# Patient Record
Sex: Female | Born: 1970
Health system: Southern US, Community
[De-identification: ages and names within clinical notes are randomized; demographics above are authoritative.]

## PROBLEM LIST (undated history)

## (undated) DIAGNOSIS — E785 Hyperlipidemia, unspecified: Secondary | ICD-10-CM

## (undated) DIAGNOSIS — A599 Trichomoniasis, unspecified: Secondary | ICD-10-CM

## (undated) DIAGNOSIS — E1165 Type 2 diabetes mellitus with hyperglycemia: Secondary | ICD-10-CM

## (undated) DIAGNOSIS — I10 Essential (primary) hypertension: Secondary | ICD-10-CM

## (undated) DIAGNOSIS — E559 Vitamin D deficiency, unspecified: Principal | ICD-10-CM

## (undated) DIAGNOSIS — M069 Rheumatoid arthritis, unspecified: Secondary | ICD-10-CM

## (undated) DIAGNOSIS — K219 Gastro-esophageal reflux disease without esophagitis: Secondary | ICD-10-CM

## (undated) DIAGNOSIS — H35371 Puckering of macula, right eye: Secondary | ICD-10-CM

## (undated) DIAGNOSIS — E1140 Type 2 diabetes mellitus with diabetic neuropathy, unspecified: Secondary | ICD-10-CM

## (undated) DIAGNOSIS — E11311 Type 2 diabetes mellitus with unspecified diabetic retinopathy with macular edema: Secondary | ICD-10-CM

## (undated) HISTORY — DX: Vitamin D deficiency, unspecified: E55.9

## (undated) HISTORY — DX: Gastro-esophageal reflux disease without esophagitis: K21.9

## (undated) HISTORY — DX: Trichomoniasis, unspecified: A59.9

## (undated) HISTORY — PX: TUBAL LIGATION: SHX77

## (undated) HISTORY — DX: Essential (primary) hypertension: I10

## (undated) HISTORY — DX: Type 2 diabetes mellitus with hyperglycemia: E11.65

## (undated) HISTORY — DX: Hyperlipidemia, unspecified: E78.5

## (undated) HISTORY — DX: Rheumatoid arthritis, unspecified: M06.9

---

## 1999-09-13 ENCOUNTER — Encounter: Admission: RE | Admit: 1999-09-13 | Discharge: 1999-09-13 | Payer: Self-pay | Admitting: General Practice

## 1999-09-13 ENCOUNTER — Encounter: Payer: Self-pay | Admitting: General Practice

## 2001-04-19 ENCOUNTER — Emergency Department (HOSPITAL_COMMUNITY): Admission: EM | Admit: 2001-04-19 | Discharge: 2001-04-19 | Payer: Self-pay | Admitting: Emergency Medicine

## 2001-04-19 ENCOUNTER — Encounter: Payer: Self-pay | Admitting: Emergency Medicine

## 2001-07-18 ENCOUNTER — Emergency Department (HOSPITAL_COMMUNITY): Admission: EM | Admit: 2001-07-18 | Discharge: 2001-07-18 | Payer: Self-pay | Admitting: *Deleted

## 2001-12-11 ENCOUNTER — Emergency Department (HOSPITAL_COMMUNITY): Admission: EM | Admit: 2001-12-11 | Discharge: 2001-12-11 | Payer: Self-pay | Admitting: Emergency Medicine

## 2001-12-11 ENCOUNTER — Encounter: Payer: Self-pay | Admitting: Emergency Medicine

## 2002-07-30 ENCOUNTER — Emergency Department (HOSPITAL_COMMUNITY): Admission: EM | Admit: 2002-07-30 | Discharge: 2002-07-30 | Payer: Self-pay | Admitting: *Deleted

## 2002-07-30 ENCOUNTER — Encounter: Payer: Self-pay | Admitting: *Deleted

## 2004-02-22 ENCOUNTER — Emergency Department (HOSPITAL_COMMUNITY): Admission: EM | Admit: 2004-02-22 | Discharge: 2004-02-22 | Payer: Self-pay | Admitting: Emergency Medicine

## 2008-05-10 ENCOUNTER — Emergency Department (HOSPITAL_COMMUNITY): Admission: EM | Admit: 2008-05-10 | Discharge: 2008-05-10 | Payer: Self-pay | Admitting: Emergency Medicine

## 2008-09-21 ENCOUNTER — Emergency Department (HOSPITAL_COMMUNITY): Admission: EM | Admit: 2008-09-21 | Discharge: 2008-09-21 | Payer: Self-pay | Admitting: Emergency Medicine

## 2008-10-01 ENCOUNTER — Encounter: Admission: RE | Admit: 2008-10-01 | Discharge: 2008-10-01 | Payer: Self-pay | Admitting: General Practice

## 2009-07-24 ENCOUNTER — Emergency Department (HOSPITAL_COMMUNITY): Admission: EM | Admit: 2009-07-24 | Discharge: 2009-07-24 | Payer: Self-pay | Admitting: Emergency Medicine

## 2010-07-18 ENCOUNTER — Emergency Department (HOSPITAL_COMMUNITY): Admission: EM | Admit: 2010-07-18 | Discharge: 2010-07-19 | Payer: Self-pay | Admitting: Emergency Medicine

## 2010-12-15 LAB — POCT CARDIAC MARKERS
CKMB, poc: 2.5 ng/mL (ref 1.0–8.0)
Troponin i, poc: <0.05 ng/mL (ref 0.00–0.09)

## 2010-12-15 LAB — BASIC METABOLIC PANEL
BUN: 19 mg/dL (ref 6–23)
CO2: 26 mEq/L (ref 19–32)
Calcium: 9.2 mg/dL (ref 8.4–10.5)
Chloride: 101 mEq/L (ref 96–112)
GFR calc non Af Amer: >60 mL/min (ref >60)
Glucose, Bld: 236 mg/dL — ABNORMAL HIGH (ref 70–99)

## 2010-12-15 LAB — CBC
Hemoglobin: 10.2 g/dL — ABNORMAL LOW (ref 12.0–15.0)
MCH: 27.4 pg (ref 26.0–34.0)
Platelets: 320 10*3/uL (ref 150–400)
WBC: 6.1 10*3/uL (ref 4.0–10.5)

## 2010-12-15 LAB — DIFFERENTIAL
Basophils Absolute: 0 10*3/uL (ref 0.0–0.1)
Lymphocytes Relative: 43 % (ref 12–46)
Monocytes Absolute: 0.6 10*3/uL (ref 0.1–1.0)
Monocytes Relative: 10 % (ref 3–12)
Neutro Abs: 2.7 10*3/uL (ref 1.7–7.7)

## 2011-01-06 LAB — URINE CULTURE: Colony Count: 10000

## 2011-01-06 LAB — URINALYSIS, ROUTINE W REFLEX MICROSCOPIC
Bilirubin Urine: NEGATIVE
Nitrite: NEGATIVE
Protein, ur: NEGATIVE mg/dL
Specific Gravity, Urine: 1.02 (ref 1.005–1.030)
pH: 5 (ref 5.0–8.0)

## 2011-07-01 LAB — URINALYSIS, ROUTINE W REFLEX MICROSCOPIC
Bilirubin Urine: NEGATIVE
Ketones, ur: NEGATIVE
Leukocytes, UA: NEGATIVE
Protein, ur: NEGATIVE
Specific Gravity, Urine: 1.02

## 2011-07-01 LAB — URINE MICROSCOPIC-ADD ON

## 2011-07-01 LAB — URINE CULTURE: Colony Count: 7000

## 2011-11-02 ENCOUNTER — Other Ambulatory Visit (HOSPITAL_COMMUNITY): Payer: Self-pay | Admitting: Family Medicine

## 2011-11-02 DIAGNOSIS — N63 Unspecified lump in unspecified breast: Secondary | ICD-10-CM

## 2011-11-16 ENCOUNTER — Ambulatory Visit (HOSPITAL_COMMUNITY)
Admission: RE | Admit: 2011-11-16 | Discharge: 2011-11-16 | Disposition: A | Discharge status: Home / Self Care | Payer: PRIVATE HEALTH INSURANCE | Source: Ambulatory Visit | Attending: Family Medicine | Admitting: Family Medicine

## 2011-11-16 ENCOUNTER — Other Ambulatory Visit (HOSPITAL_COMMUNITY): Payer: Self-pay | Admitting: Family Medicine

## 2011-11-16 ENCOUNTER — Inpatient Hospital Stay (HOSPITAL_COMMUNITY): Admission: RE | Admit: 2011-11-16 | Payer: Self-pay | Source: Ambulatory Visit

## 2011-11-16 DIAGNOSIS — N63 Unspecified lump in unspecified breast: Secondary | ICD-10-CM

## 2014-12-16 ENCOUNTER — Other Ambulatory Visit (HOSPITAL_COMMUNITY): Payer: Self-pay | Admitting: Nurse Practitioner

## 2014-12-16 DIAGNOSIS — N920 Excessive and frequent menstruation with regular cycle: Secondary | ICD-10-CM

## 2014-12-24 ENCOUNTER — Ambulatory Visit (HOSPITAL_COMMUNITY)
Admission: RE | Admit: 2014-12-24 | Discharge: 2014-12-24 | Disposition: A | Discharge status: Home / Self Care | Payer: 59 | Source: Ambulatory Visit | Attending: Nurse Practitioner | Admitting: Nurse Practitioner

## 2014-12-24 ENCOUNTER — Ambulatory Visit (HOSPITAL_COMMUNITY): Payer: 59

## 2014-12-24 DIAGNOSIS — D259 Leiomyoma of uterus, unspecified: Secondary | ICD-10-CM | POA: Diagnosis not present

## 2014-12-24 DIAGNOSIS — N920 Excessive and frequent menstruation with regular cycle: Secondary | ICD-10-CM | POA: Insufficient documentation

## 2014-12-24 DIAGNOSIS — R938 Abnormal findings on diagnostic imaging of other specified body structures: Secondary | ICD-10-CM | POA: Insufficient documentation

## 2015-02-17 ENCOUNTER — Other Ambulatory Visit (HOSPITAL_COMMUNITY): Payer: Self-pay | Admitting: Nurse Practitioner

## 2015-02-17 DIAGNOSIS — D259 Leiomyoma of uterus, unspecified: Secondary | ICD-10-CM

## 2015-02-17 DIAGNOSIS — N921 Excessive and frequent menstruation with irregular cycle: Secondary | ICD-10-CM

## 2015-02-24 ENCOUNTER — Encounter: Payer: Self-pay | Admitting: *Deleted

## 2015-02-24 DIAGNOSIS — E559 Vitamin D deficiency, unspecified: Secondary | ICD-10-CM

## 2015-02-24 DIAGNOSIS — IMO0002 Reserved for concepts with insufficient information to code with codable children: Secondary | ICD-10-CM

## 2015-02-24 DIAGNOSIS — E119 Type 2 diabetes mellitus without complications: Secondary | ICD-10-CM | POA: Insufficient documentation

## 2015-02-24 DIAGNOSIS — K219 Gastro-esophageal reflux disease without esophagitis: Secondary | ICD-10-CM

## 2015-02-24 DIAGNOSIS — M069 Rheumatoid arthritis, unspecified: Secondary | ICD-10-CM

## 2015-02-24 DIAGNOSIS — E1165 Type 2 diabetes mellitus with hyperglycemia: Secondary | ICD-10-CM

## 2015-02-24 DIAGNOSIS — E785 Hyperlipidemia, unspecified: Secondary | ICD-10-CM | POA: Insufficient documentation

## 2015-02-24 DIAGNOSIS — I10 Essential (primary) hypertension: Secondary | ICD-10-CM

## 2015-02-24 HISTORY — DX: Vitamin D deficiency, unspecified: E55.9

## 2015-02-24 HISTORY — DX: Rheumatoid arthritis, unspecified: M06.9

## 2015-02-24 HISTORY — DX: Gastro-esophageal reflux disease without esophagitis: K21.9

## 2015-02-24 HISTORY — DX: Type 2 diabetes mellitus with hyperglycemia: E11.65

## 2015-02-24 HISTORY — DX: Hyperlipidemia, unspecified: E78.5

## 2015-02-24 HISTORY — DX: Reserved for concepts with insufficient information to code with codable children: IMO0002

## 2015-02-24 HISTORY — DX: Essential (primary) hypertension: I10

## 2015-02-25 ENCOUNTER — Other Ambulatory Visit (HOSPITAL_COMMUNITY): Payer: 59

## 2015-02-25 ENCOUNTER — Ambulatory Visit (HOSPITAL_COMMUNITY)
Admission: RE | Admit: 2015-02-25 | Discharge: 2015-02-25 | Disposition: A | Discharge status: Home / Self Care | Payer: 59 | Source: Ambulatory Visit | Attending: Nurse Practitioner | Admitting: Nurse Practitioner

## 2015-02-25 DIAGNOSIS — N920 Excessive and frequent menstruation with regular cycle: Secondary | ICD-10-CM | POA: Diagnosis not present

## 2015-02-25 DIAGNOSIS — D252 Subserosal leiomyoma of uterus: Secondary | ICD-10-CM | POA: Insufficient documentation

## 2015-02-25 DIAGNOSIS — D251 Intramural leiomyoma of uterus: Secondary | ICD-10-CM | POA: Diagnosis not present

## 2015-02-25 DIAGNOSIS — N921 Excessive and frequent menstruation with irregular cycle: Secondary | ICD-10-CM

## 2015-02-25 DIAGNOSIS — D259 Leiomyoma of uterus, unspecified: Secondary | ICD-10-CM

## 2015-02-26 ENCOUNTER — Other Ambulatory Visit (HOSPITAL_COMMUNITY): Payer: Self-pay | Admitting: Nurse Practitioner

## 2015-02-26 DIAGNOSIS — Z1231 Encounter for screening mammogram for malignant neoplasm of breast: Secondary | ICD-10-CM

## 2015-02-26 DIAGNOSIS — D259 Leiomyoma of uterus, unspecified: Secondary | ICD-10-CM

## 2015-03-03 ENCOUNTER — Ambulatory Visit (HOSPITAL_COMMUNITY): Admission: RE | Admit: 2015-03-03 | Payer: 59 | Source: Ambulatory Visit

## 2015-03-05 ENCOUNTER — Encounter: Payer: Self-pay | Admitting: Obstetrics & Gynecology

## 2015-03-05 ENCOUNTER — Ambulatory Visit (INDEPENDENT_AMBULATORY_CARE_PROVIDER_SITE_OTHER): Payer: 59 | Admitting: Obstetrics & Gynecology

## 2015-03-05 VITALS — BP 144/86 | HR 80 | Wt 211.0 lb

## 2015-03-05 DIAGNOSIS — N941 Dyspareunia: Secondary | ICD-10-CM | POA: Diagnosis not present

## 2015-03-05 DIAGNOSIS — N921 Excessive and frequent menstruation with irregular cycle: Secondary | ICD-10-CM

## 2015-03-05 DIAGNOSIS — N946 Dysmenorrhea, unspecified: Secondary | ICD-10-CM

## 2015-03-05 DIAGNOSIS — IMO0002 Reserved for concepts with insufficient information to code with codable children: Secondary | ICD-10-CM

## 2015-03-05 MED ORDER — MEGESTROL ACETATE 40 MG PO TABS
ORAL_TABLET | ORAL | Status: DC
Start: 2015-03-05 — End: 2015-12-03

## 2015-03-05 NOTE — Progress Notes (Signed)
Patient ID: Alexis Landry, female   DOB: 11/24/1970, 44 y.o.   MRN: [id number]  Referral from [facility]  Chief Complaint  Patient presents with  . New GYN    referred from [facility] for fibroids & heavy cycles     HPI:    44 y.o. [id number] Patient's last menstrual period was 01/23/2015.  6 or 7 month history of increasingly heavy painful periods, she is passing increasingly large clots and has intermenstrual bleeding Pain after intercourse the last 2 time she's had sex Of note the patient just got married in April Location:  Pelvic. Quality:  Crampy. Severity:  Moderate. Timing:  No particular timing. Duration:  A day or so. Context:  Associated with. An intercourse specifically after intercourse. Modifying factors:   Signs/Symptoms:      Current outpatient prescriptions:  .  aspirin 81 MG tablet, Take 81 mg by mouth daily., Disp: , Rfl:  .  atorvastatin (LIPITOR) 10 MG tablet, Take 10 mg by mouth daily., Disp: , Rfl:  .  ergocalciferol (VITAMIN D2) 50000 UNITS capsule, Take 50,000 Units by mouth once a week., Disp: , Rfl:  .  losartan (COZAAR) 50 MG tablet, Take 50 mg by mouth daily., Disp: , Rfl:  .  meloxicam (MOBIC) 7.5 MG tablet, Take 7.5 mg by mouth daily., Disp: , Rfl:  .  metFORMIN (GLUCOPHAGE) 500 MG tablet, Take by mouth 2 (two) times daily with a meal., Disp: , Rfl:  .  polyethylene glycol (MIRALAX / GLYCOLAX) packet, Take 17 g by mouth daily., Disp: , Rfl:  .  medroxyPROGESTERone (PROVERA) 5 MG tablet, Take 5 mg by mouth daily., Disp: , Rfl:   Problem Pertinent ROS:       No burning with urination, frequency or urgency No nausea, vomiting or diarrhea Nor fever chills or other constitutional symptoms   Extended ROS:        [place]:             Past Medical History  Diagnosis Date  . Vitamin D deficiency disease 02/24/2015  . Rheumatoid arthritis 02/24/2015  . Hyperlipidemia 02/24/2015  . HTN (hypertension) 02/24/2015  .  GERD (gastroesophageal reflux disease) 02/24/2015  . DM (diabetes mellitus), type 2, uncontrolled 02/24/2015    Past Surgical History  Procedure Laterality Date  . Tubal ligation      OB History    Gravida Para Term Preterm AB TAB SAB Ectopic Multiple Living   5 4   1     4       Allergies  Allergen Reactions  . Lisinopril     History   Social History  . Marital Status: Legally Separated    Spouse Name: N/A  . Number of Children: N/A  . Years of Education: N/A   Social History Main Topics  . Smoking status: Never Smoker   . Smokeless tobacco: Not on file  . Alcohol Use: 0.0 oz/week    0 Standard drinks or equivalent per week  . Drug Use: No  . Sexual Activity: Not Currently    Birth Control/ Protection: Surgical   Other Topics Concern  . None   Social History Narrative    Family History  Problem Relation Age of Onset  . Heart disease Mother   . Kidney disease Father   . Diabetes Maternal Grandfather   . Diabetes Paternal Grandmother   . Diabetes Paternal Grandfather      Examination:  Vitals:  Blood pressure 144/86, pulse 80,  weight 211 lb (95.709 kg), last menstrual period 01/23/2015.    Physical Examination:     General Appearance:  awake, alert, oriented, in no acute distress      DATA orders and reviews: Labs were not ordered today:   Imaging studies were not ordered today:    Lab tests were reviewed today:   From office visit Imaging studies were reviewed today:  sonogram  I did independently review/view images, tracing or specimen(not simply the report) myself.  Prescription Drug Management:  New Prescriptions: Megestrol Renewed Prescriptions:   Current prescription changes:     Impression/Plan(Problem Based): 1.  Menometrorrhagia/dysmenorrhea      (new problem) : Additional workup is not needed:  Patient has sonogram which showed a relatively small subserosal myoma 4.5 cm which of course is not clinically involved with this  presentation,   {2.  Dyspareunia, after intercourse      (new problem:) : Additional workup is needed:  Patient is point to have sex but have him not ejaculate in the vagina and see if that makes a difference, suspicion is this is prostaglandin mediated    Follow Up:   1  months     Face to face time:  15 minutes  Greater than 50% of the visit time was spent in counseling and coordination of care with the patient.  The summary and outline of the counseling and care coordination is summarized in the note above.   All questions were answered.

## 2015-03-11 ENCOUNTER — Ambulatory Visit (HOSPITAL_COMMUNITY)
Admission: RE | Admit: 2015-03-11 | Discharge: 2015-03-11 | Disposition: A | Discharge status: Home / Self Care | Payer: 59 | Source: Ambulatory Visit | Attending: Nurse Practitioner | Admitting: Nurse Practitioner

## 2015-03-11 DIAGNOSIS — Z1231 Encounter for screening mammogram for malignant neoplasm of breast: Secondary | ICD-10-CM | POA: Insufficient documentation

## 2015-04-02 ENCOUNTER — Ambulatory Visit: Payer: 59 | Admitting: Obstetrics & Gynecology

## 2015-04-07 ENCOUNTER — Ambulatory Visit: Payer: 59 | Admitting: Obstetrics & Gynecology

## 2015-04-07 ENCOUNTER — Encounter: Payer: Self-pay | Admitting: *Deleted

## 2015-11-24 ENCOUNTER — Other Ambulatory Visit: Payer: BLUE CROSS/BLUE SHIELD | Admitting: Obstetrics & Gynecology

## 2015-12-03 ENCOUNTER — Ambulatory Visit (INDEPENDENT_AMBULATORY_CARE_PROVIDER_SITE_OTHER): Payer: BLUE CROSS/BLUE SHIELD | Admitting: Obstetrics & Gynecology

## 2015-12-03 ENCOUNTER — Other Ambulatory Visit (HOSPITAL_COMMUNITY)
Admission: RE | Admit: 2015-12-03 | Discharge: 2015-12-03 | Disposition: A | Discharge status: Home / Self Care | Payer: BLUE CROSS/BLUE SHIELD | Source: Ambulatory Visit | Attending: Obstetrics & Gynecology | Admitting: Obstetrics & Gynecology

## 2015-12-03 ENCOUNTER — Encounter: Payer: Self-pay | Admitting: Obstetrics & Gynecology

## 2015-12-03 VITALS — BP 150/90 | HR 84 | Ht 66.0 in | Wt 205.4 lb

## 2015-12-03 DIAGNOSIS — Z01419 Encounter for gynecological examination (general) (routine) without abnormal findings: Secondary | ICD-10-CM

## 2015-12-03 DIAGNOSIS — Z1151 Encounter for screening for human papillomavirus (HPV): Secondary | ICD-10-CM | POA: Diagnosis not present

## 2015-12-03 MED ORDER — KETOROLAC TROMETHAMINE 10 MG PO TABS: 10.0000 mg | ORAL_TABLET | Freq: Three times a day (TID) | ORAL | Status: DC | PRN

## 2015-12-03 NOTE — Progress Notes (Signed)
Patient ID: Alexis Landry, female   DOB: Jul 17, 1971, 45 y.o.   MRN: [id number] Subjective:     Alexis Landry is a 45 y.o. female here for a routine exam.  Patient's last menstrual period was 11/23/2015. [id number] Birth Control Method:  BTL Menstrual Calendar(currently): regular not as bad as they used to be  Current complaints: lower abdominal pressure discomfort, last 2 weeks.   Current acute medical issues:  diabetic   Recent Gynecologic History Patient's last menstrual period was 11/23/2015. Last Pap: 2011,  normal Last mammogram: 2013,  normal  Past Medical History  Diagnosis Date  . Vitamin D deficiency disease 02/24/2015  . Rheumatoid arthritis ([place]) 02/24/2015  . Hyperlipidemia 02/24/2015  . HTN (hypertension) 02/24/2015  . GERD (gastroesophageal reflux disease) 02/24/2015  . DM (diabetes mellitus), type 2, uncontrolled ([place]) 02/24/2015    Past Surgical History  Procedure Laterality Date  . Tubal ligation      OB History    Gravida Para Term Preterm AB TAB SAB Ectopic Multiple Living   5 4   1     4       Social History   Social History  . Marital Status: Legally Separated    Spouse Name: N/A  . Number of Children: N/A  . Years of Education: N/A   Social History Main Topics  . Smoking status: Never Smoker   . Smokeless tobacco: None  . Alcohol Use: 0.0 oz/week    0 Standard drinks or equivalent per week  . Drug Use: No  . Sexual Activity: Not Currently    Birth Control/ Protection: Surgical   Other Topics Concern  . None   Social History Narrative    Family History  Problem Relation Age of Onset  . Heart disease Mother   . Kidney disease Father   . Diabetes Maternal Grandfather   . Diabetes Paternal Grandmother   . Diabetes Paternal Grandfather      Current outpatient prescriptions:  .  aspirin 81 MG tablet, Take 81 mg by mouth daily., Disp: , Rfl:  .  atorvastatin (LIPITOR) 10 MG tablet, Take 10 mg by mouth daily., Disp: , Rfl:  .   ergocalciferol (VITAMIN D2) 50000 UNITS capsule, Take 50,000 Units by mouth once a week., Disp: , Rfl:  .  losartan (COZAAR) 50 MG tablet, Take 50 mg by mouth daily., Disp: , Rfl:  .  meloxicam (MOBIC) 7.5 MG tablet, Take 7.5 mg by mouth daily., Disp: , Rfl:  .  metFORMIN (GLUCOPHAGE) 500 MG tablet, Take by mouth 2 (two) times daily with a meal., Disp: , Rfl:  .  polyethylene glycol (MIRALAX / GLYCOLAX) packet, Take 17 g by mouth daily., Disp: , Rfl:   Review of Systems  Review of Systems  Constitutional: Negative for fever, chills, weight loss, malaise/fatigue and diaphoresis.  HENT: Negative for hearing loss, ear pain, nosebleeds, congestion, sore throat, neck pain, tinnitus and ear discharge.   Eyes: Negative for blurred vision, double vision, photophobia, pain, discharge and redness.  Respiratory: Negative for cough, hemoptysis, sputum production, shortness of breath, wheezing and stridor.   Cardiovascular: Negative for chest pain, palpitations, orthopnea, claudication, leg swelling and PND.  Gastrointestinal: negative for abdominal pain. Negative for heartburn, nausea, vomiting, diarrhea, constipation, blood in stool and melena.  Genitourinary: Negative for dysuria, urgency, frequency, hematuria and flank pain.  Musculoskeletal: Negative for myalgias, back pain, joint pain and falls.  Skin: Negative for itching and rash.  Neurological: Negative for dizziness, tingling, tremors, sensory change,  speech change, focal weakness, seizures, loss of consciousness, weakness and headaches.  Endo/Heme/Allergies: Negative for environmental allergies and polydipsia. Does not bruise/bleed easily.  Psychiatric/Behavioral: Negative for depression, suicidal ideas, hallucinations, memory loss and substance abuse. The patient is not nervous/anxious and does not have insomnia.        Objective:  Blood pressure 150/90, pulse 84, height 5\' 6"  (1.676 m), weight 205 lb 6.4 oz (93.169 kg), last menstrual period  11/23/2015.   Physical Exam  Vitals reviewed. Constitutional: She is oriented to person, place, and time. She appears well-developed and well-nourished.  HENT:  Head: Normocephalic and atraumatic.        Right Ear: External ear normal.  Left Ear: External ear normal.  Nose: Nose normal.  Mouth/Throat: Oropharynx is clear and moist.  Eyes: Conjunctivae and EOM are normal. Pupils are equal, round, and reactive to light. Right eye exhibits no discharge. Left eye exhibits no discharge. No scleral icterus.  Neck: Normal range of motion. Neck supple. No tracheal deviation present. No thyromegaly present.  Cardiovascular: Normal rate, regular rhythm, normal heart sounds and intact distal pulses.  Exam reveals no gallop and no friction rub.   No murmur heard. Respiratory: Effort normal and breath sounds normal. No respiratory distress. She has no wheezes. She has no rales. She exhibits no tenderness.  GI: Soft. Bowel sounds are normal. She exhibits no distension and no mass. There is no tenderness. There is no rebound and no guarding.  Genitourinary:  Breasts no masses skin changes or nipple changes bilaterally      Vulva is normal without lesions Vagina is pink moist without discharge Cervix normal in appearance and pap is done Uterus is normal size shape and contour Adnexa is negative with normal sized ovaries   Musculoskeletal: Normal range of motion. She exhibits no edema and no tenderness.  Neurological: She is alert and oriented to person, place, and time. She has normal reflexes. She displays normal reflexes. No cranial nerve deficit. She exhibits normal muscle tone. Coordination normal.  Skin: Skin is warm and dry. No rash noted. No erythema. No pallor.  Psychiatric: She has a normal mood and affect. Her behavior is normal. Judgment and thought content normal.       Assessment:    Healthy female exam.    Plan:    Contraception: tubal ligation. Mammogram ordered. Follow up in: 1  year.    No orders of the defined types were placed in this encounter.    No orders of the defined types were placed in this encounter.

## 2015-12-07 LAB — CYTOLOGY - PAP

## 2016-07-26 ENCOUNTER — Ambulatory Visit: Payer: BLUE CROSS/BLUE SHIELD | Admitting: Obstetrics & Gynecology

## 2016-08-03 ENCOUNTER — Encounter (HOSPITAL_COMMUNITY): Payer: Self-pay

## 2016-08-03 ENCOUNTER — Emergency Department (HOSPITAL_COMMUNITY)
Admission: EM | Admit: 2016-08-03 | Discharge: 2016-08-03 | Disposition: A | Discharge status: Home / Self Care | Payer: BLUE CROSS/BLUE SHIELD | Attending: Emergency Medicine | Admitting: Emergency Medicine

## 2016-08-03 ENCOUNTER — Emergency Department (HOSPITAL_COMMUNITY): Payer: BLUE CROSS/BLUE SHIELD

## 2016-08-03 DIAGNOSIS — E119 Type 2 diabetes mellitus without complications: Secondary | ICD-10-CM | POA: Diagnosis not present

## 2016-08-03 DIAGNOSIS — Y92009 Unspecified place in unspecified non-institutional (private) residence as the place of occurrence of the external cause: Secondary | ICD-10-CM | POA: Insufficient documentation

## 2016-08-03 DIAGNOSIS — Y999 Unspecified external cause status: Secondary | ICD-10-CM | POA: Diagnosis not present

## 2016-08-03 DIAGNOSIS — W010XXA Fall on same level from slipping, tripping and stumbling without subsequent striking against object, initial encounter: Secondary | ICD-10-CM | POA: Insufficient documentation

## 2016-08-03 DIAGNOSIS — Z7982 Long term (current) use of aspirin: Secondary | ICD-10-CM | POA: Insufficient documentation

## 2016-08-03 DIAGNOSIS — Z7984 Long term (current) use of oral hypoglycemic drugs: Secondary | ICD-10-CM | POA: Insufficient documentation

## 2016-08-03 DIAGNOSIS — Z79899 Other long term (current) drug therapy: Secondary | ICD-10-CM | POA: Insufficient documentation

## 2016-08-03 DIAGNOSIS — S46001A Unspecified injury of muscle(s) and tendon(s) of the rotator cuff of right shoulder, initial encounter: Secondary | ICD-10-CM | POA: Diagnosis not present

## 2016-08-03 DIAGNOSIS — Y939 Activity, unspecified: Secondary | ICD-10-CM | POA: Diagnosis not present

## 2016-08-03 DIAGNOSIS — S4991XA Unspecified injury of right shoulder and upper arm, initial encounter: Secondary | ICD-10-CM | POA: Diagnosis present

## 2016-08-03 DIAGNOSIS — M25511 Pain in right shoulder: Secondary | ICD-10-CM

## 2016-08-03 DIAGNOSIS — I10 Essential (primary) hypertension: Secondary | ICD-10-CM | POA: Diagnosis not present

## 2016-08-03 MED ORDER — KETOROLAC TROMETHAMINE 60 MG/2ML IM SOLN
60.0000 mg | Freq: Once | INTRAMUSCULAR | Status: AC
  Administered 2016-08-03: 60 mg via INTRAMUSCULAR
  Filled 2016-08-03: qty 2

## 2016-08-03 MED ORDER — NAPROXEN 500 MG PO TABS: 500.0000 mg | ORAL_TABLET | Freq: Two times a day (BID) | ORAL | 0 refills | Status: DC

## 2016-08-03 MED ORDER — METHOCARBAMOL 500 MG PO TABS: ORAL_TABLET | ORAL | 0 refills | Status: DC

## 2016-08-03 NOTE — ED Triage Notes (Signed)
Fell on Sunday night and injured right arm and right shoulder.  I have not been evaluated.  I tripped and fell over a mattress and fell onto the mattress.  Did not fall onto anything hard.  It hurt hurt that day and is becoming worse.

## 2016-08-03 NOTE — Discharge Instructions (Signed)
Take the medications as prescribed. Wear the sling for comfort. Use ice packs for comfort. You probably have a rotator cuff injury from the way your describe the way you fell and by your exam, but this does not show up on xrays. Please follow up with an orthopedist for further evaluation.

## 2016-08-03 NOTE — ED Provider Notes (Signed)
[facility] DEPT Provider Note   CSN: [id number] Arrival date & time: 08/03/16  0254  Time seen 03:55 AM   History   Chief Complaint Chief Complaint  Patient presents with  . Fall    HPI Alexis Landry is a 45 y.o. female.  HPI  patient states her granddaughter likes to pull around a twin mattress around the house and that on October 29 she tripped over the mattress and fell onto the mattress with her right arm outstretched. She had some achiness in her shoulder  initially that it has gotten progressively worse over the past 2 days. She states now she has pain with any range of motion of her shoulder. She denies any numbness or tingling of her extremities including her fingers. She is states she took 1 BC powder today. She states she takes a hot shower in the morning and that only helps temporarily. She has not tried ice. She denies hitting her head or any other injury. Patient is right-handed.\  PCP none (was Reva Bores)  Past Medical History:  Diagnosis Date  . DM (diabetes mellitus), type 2, uncontrolled ([place]) 02/24/2015  . GERD (gastroesophageal reflux disease) 02/24/2015  . HTN (hypertension) 02/24/2015  . Hyperlipidemia 02/24/2015  . Rheumatoid arthritis ([place]) 02/24/2015  . Vitamin D deficiency disease 02/24/2015    Patient Active Problem List   Diagnosis Date Noted  . Vitamin D deficiency disease 02/24/2015  . HTN (hypertension) 02/24/2015  . Hyperlipidemia 02/24/2015  . GERD (gastroesophageal reflux disease) 02/24/2015  . DM (diabetes mellitus), type 2, uncontrolled ([place]) 02/24/2015  . Rheumatoid arthritis ([place]) 02/24/2015    Past Surgical History:  Procedure Laterality Date  . TUBAL LIGATION      OB History    Gravida Para Term Preterm AB Living   5 4     1 4    SAB TAB Ectopic Multiple Live Births                   Home Medications    Prior to Admission medications   Medication Sig Start Date End Date Taking? Authorizing Provider  aspirin 81 MG  tablet Take 81 mg by mouth daily.   Yes Historical Provider, MD  atorvastatin (LIPITOR) 10 MG tablet Take 10 mg by mouth daily.   Yes Historical Provider, MD  cloNIDine (CATAPRES) 0.1 MG tablet Take 0.1 mg by mouth daily.   Yes Historical Provider, MD  ergocalciferol (VITAMIN D2) 50000 UNITS capsule Take 50,000 Units by mouth once a week.   Yes Historical Provider, MD  losartan (COZAAR) 50 MG tablet Take 50 mg by mouth daily.   Yes Historical Provider, MD  meloxicam (MOBIC) 7.5 MG tablet Take 7.5 mg by mouth daily.   Yes Historical Provider, MD  metFORMIN (GLUCOPHAGE) 500 MG tablet Take by mouth 2 (two) times daily with a meal.   Yes Historical Provider, MD  polyethylene glycol (MIRALAX / GLYCOLAX) packet Take 17 g by mouth daily.   Yes Historical Provider, MD  atorvastatin (LIPITOR) 10 MG tablet Take 10 mg by mouth daily.    Historical Provider, MD  ketorolac (TORADOL) 10 MG tablet Take 1 tablet (10 mg total) by mouth every 8 (eight) hours as needed. 12/03/15   Florian Buff, MD  methocarbamol (ROBAXIN) 500 MG tablet Take 1 or 2 po Q 6hrs for pain 08/03/16   Rolland Porter, MD  naproxen (NAPROSYN) 500 MG tablet Take 1 tablet (500 mg total) by mouth 2 (two) times daily. 08/03/16  Rolland Porter, MD    Family History Family History  Problem Relation Age of Onset  . Heart disease Mother   . Kidney disease Father   . Diabetes Maternal Grandfather   . Diabetes Paternal Grandmother   . Diabetes Paternal Grandfather     Social History Social History  Substance Use Topics  . Smoking status: Never Smoker  . Smokeless tobacco: Never Used  . Alcohol use 0.0 oz/week  employed   Allergies   Lisinopril and Sulfa antibiotics   Review of Systems Review of Systems  All other systems reviewed and are negative.    Physical Exam Updated Vital Signs BP (!) 165/103 (BP Location: Left Arm)   Pulse 88   Temp 98.4 F (36.9 C) (Oral)   Resp 20   Ht 5\' 6"  (1.676 m)   Wt 206 lb (93.4 kg)   LMP 07/26/2016  (Exact Date)   SpO2 99%   BMI 33.25 kg/m   Vital signs normal except hypertension   Physical Exam  Constitutional: She is oriented to person, place, and time. She appears well-developed and well-nourished.  Non-toxic appearance. She does not appear ill. No distress.  HENT:  Head: Normocephalic and atraumatic.  Right Ear: External ear normal.  Left Ear: External ear normal.  Nose: Nose normal. No mucosal edema or rhinorrhea.  Mouth/Throat: Mucous membranes are normal. No dental abscesses or uvula swelling.  Eyes: Conjunctivae and EOM are normal.  Neck: Normal range of motion and full passive range of motion without pain. Neck supple.  Nontender midline cervical spine. She has some tenderness over the right trapezius muscle.  Cardiovascular: Normal rate.   Pulmonary/Chest: Effort normal. No respiratory distress. She has no rhonchi. She exhibits no crepitus.  Abdominal: Normal appearance.  Musculoskeletal: She exhibits tenderness. She exhibits no edema.  Moves all extremities well except for her right upper extremity. She has diffuse tenderness of her right shoulder. She is unable to have any range of motion of her shoulder due to pain. There is no obvious bruising seen there is no obvious swelling seen. She has good distal pulses. She has no pain in her elbow or wrist.  Neurological: She is alert and oriented to person, place, and time. She has normal strength. No cranial nerve deficit.  Skin: Skin is warm, dry and intact. No rash noted. No erythema. No pallor.  Psychiatric: She has a normal mood and affect. Her speech is normal and behavior is normal. Her mood appears not anxious.  Nursing note and vitals reviewed.    ED Treatments / Results  Labs (all labs ordered are listed, but only abnormal results are displayed) Labs Reviewed - No data to display  EKG  EKG Interpretation None       Radiology Dg Shoulder Right  Result Date: 08/03/2016 CLINICAL DATA:  [place] 3 days ago,  persistent pain. EXAM: RIGHT SHOULDER - 2+ VIEW COMPARISON:  None. FINDINGS: There is no evidence of fracture or dislocation. There is no evidence of arthropathy or other focal bone abnormality. Soft tissues are unremarkable. IMPRESSION: Negative. Electronically Signed   By: Andreas Newport M.D.   On: 08/03/2016 03:58    Procedures Procedures (including critical care time)  Medications Ordered in ED Medications  ketorolac (TORADOL) injection 60 mg (60 mg Intramuscular Given 08/03/16 0337)     Initial Impression / Assessment and Plan / ED Course  I have reviewed the triage vital signs and the nursing notes.  Pertinent labs & imaging results that were available during  my care of the patient were reviewed by me and considered in my medical decision making (see chart for details).  Clinical Course   Pt was given toradol IM for pain and xray obtained. I suspect she may have a rotator cuff injury which may not be visible on xray. She was given an ice pack.   04:35 AM discussed her xray results which were normal. We discussed the mechanism of injury and on her exam her injury is most likely to be a rotator cuff injury which would not show up on x-ray. She was referred to orthopedics for further evaluation and possible MRI if not improving. She was placed in a sling. She was given an ice pack. She was given Toradol in the ED and her pain was better.  Final Clinical Impressions(s) / ED Diagnoses   Final diagnoses:  Acute pain of right shoulder  Rotator cuff injury, right, initial encounter    New Prescriptions New Prescriptions   METHOCARBAMOL (ROBAXIN) 500 MG TABLET    Take 1 or 2 po Q 6hrs for pain   NAPROXEN (NAPROSYN) 500 MG TABLET    Take 1 tablet (500 mg total) by mouth 2 (two) times daily.    Plan discharge  Rolland Porter, MD, Barbette Or, MD 08/03/16 [phone]

## 2016-09-28 ENCOUNTER — Encounter (HOSPITAL_COMMUNITY): Payer: Self-pay | Admitting: Emergency Medicine

## 2016-09-28 ENCOUNTER — Emergency Department (HOSPITAL_COMMUNITY): Payer: BLUE CROSS/BLUE SHIELD

## 2016-09-28 ENCOUNTER — Emergency Department (HOSPITAL_COMMUNITY)
Admission: EM | Admit: 2016-09-28 | Discharge: 2016-09-28 | Disposition: A | Discharge status: Home / Self Care | Payer: BLUE CROSS/BLUE SHIELD | Attending: Emergency Medicine | Admitting: Emergency Medicine

## 2016-09-28 DIAGNOSIS — Z7984 Long term (current) use of oral hypoglycemic drugs: Secondary | ICD-10-CM | POA: Diagnosis not present

## 2016-09-28 DIAGNOSIS — R300 Dysuria: Secondary | ICD-10-CM | POA: Insufficient documentation

## 2016-09-28 DIAGNOSIS — R350 Frequency of micturition: Secondary | ICD-10-CM | POA: Insufficient documentation

## 2016-09-28 DIAGNOSIS — Z7982 Long term (current) use of aspirin: Secondary | ICD-10-CM | POA: Insufficient documentation

## 2016-09-28 DIAGNOSIS — E119 Type 2 diabetes mellitus without complications: Secondary | ICD-10-CM | POA: Insufficient documentation

## 2016-09-28 DIAGNOSIS — R109 Unspecified abdominal pain: Secondary | ICD-10-CM | POA: Diagnosis present

## 2016-09-28 DIAGNOSIS — I10 Essential (primary) hypertension: Secondary | ICD-10-CM | POA: Insufficient documentation

## 2016-09-28 DIAGNOSIS — M545 Low back pain: Secondary | ICD-10-CM | POA: Diagnosis not present

## 2016-09-28 DIAGNOSIS — Z79899 Other long term (current) drug therapy: Secondary | ICD-10-CM | POA: Insufficient documentation

## 2016-09-28 LAB — BASIC METABOLIC PANEL
Anion gap: 6 (ref 5–15)
BUN: 15 mg/dL (ref 6–20)
CALCIUM: 9.1 mg/dL (ref 8.9–10.3)
CO2: 28 mmol/L (ref 22–32)
Chloride: 100 mmol/L — ABNORMAL LOW (ref 101–111)
Creatinine, Ser: 0.9 mg/dL (ref 0.44–1.00)
GFR calc Af Amer: >60 mL/min (ref >60)
GFR calc non Af Amer: >60 mL/min (ref >60)
GLUCOSE: 216 mg/dL — AB (ref 65–99)
Potassium: 3.7 mmol/L (ref 3.5–5.1)
Sodium: 134 mmol/L — ABNORMAL LOW (ref 135–145)

## 2016-09-28 LAB — URINALYSIS, MICROSCOPIC (REFLEX)

## 2016-09-28 LAB — URINALYSIS, ROUTINE W REFLEX MICROSCOPIC
Bilirubin Urine: NEGATIVE
Ketones, ur: NEGATIVE mg/dL
LEUKOCYTES UA: NEGATIVE
Nitrite: NEGATIVE
PROTEIN: 100 mg/dL — AB
Specific Gravity, Urine: >1.03 — ABNORMAL HIGH (ref 1.005–1.030)
pH: 6 (ref 5.0–8.0)

## 2016-09-28 LAB — POC URINE PREG, ED: Preg Test, Ur: NEGATIVE

## 2016-09-28 MED ORDER — TRAMADOL HCL 50 MG PO TABS
50.0000 mg | ORAL_TABLET | Freq: Once | ORAL | Status: AC
  Administered 2016-09-28: 50 mg via ORAL
  Filled 2016-09-28: qty 1

## 2016-09-28 MED ORDER — PHENAZOPYRIDINE HCL 100 MG PO TABS
200.0000 mg | ORAL_TABLET | Freq: Once | ORAL | Status: AC
  Administered 2016-09-28: 200 mg via ORAL
  Filled 2016-09-28: qty 2

## 2016-09-28 MED ORDER — PHENAZOPYRIDINE HCL 200 MG PO TABS
200.0000 mg | ORAL_TABLET | Freq: Three times a day (TID) | ORAL | 0 refills | Status: DC
Start: 2016-09-28 — End: 2016-12-19

## 2016-09-28 MED ORDER — TRAMADOL HCL 50 MG PO TABS: 50.0000 mg | ORAL_TABLET | Freq: Four times a day (QID) | ORAL | 0 refills | Status: DC | PRN

## 2016-09-28 NOTE — ED Triage Notes (Signed)
Patient complains of lower back pain with urinary frequency x 2 weeks.

## 2016-09-28 NOTE — Discharge Instructions (Signed)
Your urine is being sent for a culture, but there is no sign of infection today.  Your CT scan is also negative for kidney stones or any other source of your symptoms.  Your kidney function is good today.  Use the medicines prescribed for your symptoms.  The pyridium should help a lot with the pain associated with urination - this will turn your urine bright yellow/orange and is normal.

## 2016-09-30 LAB — URINE CULTURE: SPECIAL REQUESTS: NORMAL

## 2016-09-30 NOTE — ED Provider Notes (Signed)
[place] DEPT Provider Note   CSN: [id number] Arrival date & time: 09/28/16  1759     History   Chief Complaint Chief Complaint  Patient presents with  . Back Pain  . Urinary Frequency    HPI Alexis Landry is a 45 y.o. female presenting with a 2 week history of low back pain, described as aching left sided pressure without radiation in addition to increased urinary frequency. She reports it is somewhat positional but also feels at rest.  She denies increased thirst, dysuria,  fevers, chills, hematuria, nausea, vomiting, urinary incontinence or urgency.  She has no history of kidney stones but strong family history.  HPI  Past Medical History:  Diagnosis Date  . DM (diabetes mellitus), type 2, uncontrolled ([place]) 02/24/2015  . GERD (gastroesophageal reflux disease) 02/24/2015  . HTN (hypertension) 02/24/2015  . Hyperlipidemia 02/24/2015  . Rheumatoid arthritis ([place]) 02/24/2015  . Vitamin D deficiency disease 02/24/2015    Patient Active Problem List   Diagnosis Date Noted  . Vitamin D deficiency disease 02/24/2015  . HTN (hypertension) 02/24/2015  . Hyperlipidemia 02/24/2015  . GERD (gastroesophageal reflux disease) 02/24/2015  . DM (diabetes mellitus), type 2, uncontrolled ([place]) 02/24/2015  . Rheumatoid arthritis ([place]) 02/24/2015    Past Surgical History:  Procedure Laterality Date  . TUBAL LIGATION      OB History    Gravida Para Term Preterm AB Living   5 4     1 4    SAB TAB Ectopic Multiple Live Births                   Home Medications    Prior to Admission medications   Medication Sig Start Date End Date Taking? Authorizing Provider  aspirin 81 MG tablet Take 81 mg by mouth daily.    Historical Provider, MD  atorvastatin (LIPITOR) 10 MG tablet Take 10 mg by mouth daily.    Historical Provider, MD  atorvastatin (LIPITOR) 10 MG tablet Take 10 mg by mouth daily.    Historical Provider, MD  cloNIDine (CATAPRES) 0.1 MG tablet Take 0.1 mg by mouth  daily.    Historical Provider, MD  ergocalciferol (VITAMIN D2) 50000 UNITS capsule Take 50,000 Units by mouth once a week.    Historical Provider, MD  ketorolac (TORADOL) 10 MG tablet Take 1 tablet (10 mg total) by mouth every 8 (eight) hours as needed. 12/03/15   Florian Buff, MD  losartan (COZAAR) 50 MG tablet Take 50 mg by mouth daily.    Historical Provider, MD  meloxicam (MOBIC) 7.5 MG tablet Take 7.5 mg by mouth daily.    Historical Provider, MD  metFORMIN (GLUCOPHAGE) 500 MG tablet Take by mouth 2 (two) times daily with a meal.    Historical Provider, MD  methocarbamol (ROBAXIN) 500 MG tablet Take 1 or 2 po Q 6hrs for pain 08/03/16   Rolland Porter, MD  naproxen (NAPROSYN) 500 MG tablet Take 1 tablet (500 mg total) by mouth 2 (two) times daily. 08/03/16   Rolland Porter, MD  phenazopyridine (PYRIDIUM) 200 MG tablet Take 1 tablet (200 mg total) by mouth 3 (three) times daily. 09/28/16   Evalee Jefferson, PA-C  polyethylene glycol (MIRALAX / GLYCOLAX) packet Take 17 g by mouth daily.    Historical Provider, MD  traMADol (ULTRAM) 50 MG tablet Take 1 tablet (50 mg total) by mouth every 6 (six) hours as needed. 09/28/16   Evalee Jefferson, PA-C    Family History Family History  Problem Relation  Age of Onset  . Heart disease Mother   . Kidney disease Father   . Diabetes Maternal Grandfather   . Diabetes Paternal Grandmother   . Diabetes Paternal Grandfather     Social History Social History  Substance Use Topics  . Smoking status: Never Smoker  . Smokeless tobacco: Never Used  . Alcohol use 0.0 oz/week     Allergies   Lisinopril and Sulfa antibiotics   Review of Systems Review of Systems  Constitutional: Negative for chills and fever.  HENT: Negative for congestion and sore throat.   Eyes: Negative.   Respiratory: Negative for chest tightness and shortness of breath.   Cardiovascular: Negative for chest pain.  Gastrointestinal: Negative for abdominal pain, nausea and vomiting.  Genitourinary:  Positive for frequency. Negative for dysuria, hematuria, urgency and vaginal discharge.  Musculoskeletal: Positive for back pain. Negative for arthralgias, joint swelling and neck pain.  Skin: Negative.  Negative for rash and wound.  Neurological: Negative for dizziness, weakness, light-headedness, numbness and headaches.  Psychiatric/Behavioral: Negative.      Physical Exam Updated Vital Signs BP 191/88 (BP Location: Right Arm)   Pulse 71   Temp 99.2 F (37.3 C) (Oral)   Resp 16   Ht 5\' 6"  (1.676 m)   Wt 97.5 kg   LMP 08/17/2016   SpO2 100%   BMI 34.70 kg/m   Physical Exam  Constitutional: She appears well-developed and well-nourished.  HENT:  Head: Normocephalic.  Eyes: Conjunctivae are normal.  Neck: Normal range of motion. Neck supple.  Cardiovascular: Normal rate and intact distal pulses.   Pedal pulses normal.  Pulmonary/Chest: Effort normal.  Abdominal: Soft. Bowel sounds are normal. She exhibits no distension and no mass. There is no tenderness. There is no guarding.  Musculoskeletal: Normal range of motion. She exhibits no edema.       Lumbar back: She exhibits tenderness. She exhibits no swelling, no edema and no spasm.       Back:  Neurological: She is alert. She has normal strength. She displays no atrophy and no tremor. No sensory deficit. Gait normal.  Reflex Scores:      Patellar reflexes are 2+ on the right side and 2+ on the left side.      Achilles reflexes are 2+ on the right side and 2+ on the left side. No strength deficit noted in hip and knee flexor and extensor muscle groups.  Ankle flexion and extension intact.  Skin: Skin is warm and dry.  Psychiatric: She has a normal mood and affect.  Nursing note and vitals reviewed.    ED Treatments / Results  Labs (all labs ordered are listed, but only abnormal results are displayed) Labs Reviewed  URINE CULTURE - Abnormal; Notable for the following:       Result Value   Culture MULTIPLE SPECIES  PRESENT, SUGGEST RECOLLECTION (*)    All other components within normal limits  URINALYSIS, ROUTINE W REFLEX MICROSCOPIC - Abnormal; Notable for the following:    Specific Gravity, Urine >1.030 (*)    Glucose, UA >=500 (*)    Hgb urine dipstick SMALL (*)    Protein, ur 100 (*)    All other components within normal limits  URINALYSIS, MICROSCOPIC (REFLEX) - Abnormal; Notable for the following:    Bacteria, UA RARE (*)    Squamous Epithelial / LPF 0-5 (*)    All other components within normal limits  BASIC METABOLIC PANEL - Abnormal; Notable for the following:    Sodium  134 (*)    Chloride 100 (*)    Glucose, Bld 216 (*)    All other components within normal limits  POC URINE PREG, ED    EKG  EKG Interpretation None       Radiology Ct Renal Stone Study  Result Date: 09/28/2016 CLINICAL DATA:  Right flank pain.  Urinary frequency. EXAM: CT ABDOMEN AND PELVIS WITHOUT CONTRAST TECHNIQUE: Multidetector CT imaging of the abdomen and pelvis was performed following the standard protocol without IV contrast. COMPARISON:  Pelvic ultrasound 02/25/2015.  CT 522 L5 FINDINGS: Lower chest: The lung bases are clear. Hepatobiliary: No focal liver abnormality is seen. No gallstones, gallbladder wall thickening, or biliary dilatation. Pancreas: No ductal dilatation or inflammation. Faint curvilinear calcification about the pancreatic tail is felt to be secondary to splenic vasculature. Spleen: Normal in size without focal abnormality. Adrenals/Urinary Tract: Adrenal glands are unremarkable. Kidneys are symmetric in size without hydronephrosis or perinephric edema. No evidence of urolithiasis. Suspect small cyst in the mid left kidney, suboptimally assessed without contrast. Bladder is unremarkable. Multiple pelvic calcifications felt to be phleboliths and outside the course of the ureters. Stomach/Bowel: Stomach is within normal limits. Appendix appears normal. No evidence of bowel wall thickening,  distention, or inflammatory changes. Vascular/Lymphatic: Vascular calcifications in the pelvis. Abdominal aorta is normal in caliber. Small retroperitoneal nodes not enlarged by size criteria. Reproductive: Bowel is uterus with probable fibroid, better characterized on prior MRI. Ovaries not well characterized but grossly symmetric in size. Small amount of free fluid in the pelvis is physiologic. Other: No free air.  Tiny fat containing umbilical hernia. Musculoskeletal: Degenerative disc disease at L4-L5 with vacuum phenomenon and surrounding endplate change. There are no acute or suspicious osseous abnormalities. IMPRESSION: 1. No renal stones or obstructive uropathy. No acute abnormality in the abdomen/pelvis. 2. Uterine fibroid. Electronically Signed   By: Jeb Levering M.D.   On: 09/28/2016 21:30    Procedures Procedures (including critical care time)  Medications Ordered in ED Medications  phenazopyridine (PYRIDIUM) tablet 200 mg (200 mg Oral Given 09/28/16 2203)  traMADol (ULTRAM) tablet 50 mg (50 mg Oral Given 09/28/16 2203)     Initial Impression / Assessment and Plan / ED Course  I have reviewed the triage vital signs and the nursing notes.  Pertinent labs & imaging results that were available during my care of the patient were reviewed by me and considered in my medical decision making (see chart for details).  Clinical Course     Imaging and labs reviewed. Pt with possible musculoskeletal source, but with increased urinary frequency. Labs stable, hyperglycemia without dka. No uti. Pt prescribed ultram for pain, pyridium for urinary sx.  Plan f/u for any persistent sx.  Currently awaiting acceptance as new pt at [facility]. In interim, return here for any new or worsened sx.   Final Clinical Impressions(s) / ED Diagnoses   Final diagnoses:  Flank pain  Dysuria    New Prescriptions Discharge Medication List as of 09/28/2016  9:53 PM    START taking these medications     Details  phenazopyridine (PYRIDIUM) 200 MG tablet Take 1 tablet (200 mg total) by mouth 3 (three) times daily., Starting Wed 09/28/2016, Print    traMADol (ULTRAM) 50 MG tablet Take 1 tablet (50 mg total) by mouth every 6 (six) hours as needed., Starting Wed 09/28/2016, Print         Evalee Jefferson, PA-C 09/30/16 1418    Fredia Sorrow, MD 10/03/16 1651

## 2016-10-25 ENCOUNTER — Other Ambulatory Visit (HOSPITAL_COMMUNITY): Payer: Self-pay | Admitting: Internal Medicine

## 2016-10-25 DIAGNOSIS — Z1231 Encounter for screening mammogram for malignant neoplasm of breast: Secondary | ICD-10-CM

## 2016-11-02 ENCOUNTER — Ambulatory Visit (HOSPITAL_COMMUNITY)
Admission: RE | Admit: 2016-11-02 | Discharge: 2016-11-02 | Disposition: A | Discharge status: Home / Self Care | Payer: BLUE CROSS/BLUE SHIELD | Source: Ambulatory Visit | Attending: Internal Medicine | Admitting: Internal Medicine

## 2016-11-02 ENCOUNTER — Encounter (HOSPITAL_COMMUNITY): Payer: Self-pay | Admitting: Radiology

## 2016-11-02 DIAGNOSIS — Z1231 Encounter for screening mammogram for malignant neoplasm of breast: Secondary | ICD-10-CM

## 2016-11-17 ENCOUNTER — Telehealth: Payer: Self-pay

## 2016-11-17 NOTE — Telephone Encounter (Signed)
Pt left Vm that she is ready to schedule colonoscopy. [phone].

## 2016-11-24 NOTE — Telephone Encounter (Addendum)
Pt has been scheduled an OV with Neil Crouch, PA on 12/19/2016 at 9:30 for constipation.

## 2016-12-19 ENCOUNTER — Encounter: Payer: Self-pay | Admitting: Gastroenterology

## 2016-12-19 ENCOUNTER — Ambulatory Visit (INDEPENDENT_AMBULATORY_CARE_PROVIDER_SITE_OTHER): Payer: BLUE CROSS/BLUE SHIELD | Admitting: Gastroenterology

## 2016-12-19 ENCOUNTER — Other Ambulatory Visit: Payer: Self-pay

## 2016-12-19 DIAGNOSIS — K5900 Constipation, unspecified: Secondary | ICD-10-CM

## 2016-12-19 DIAGNOSIS — Z1211 Encounter for screening for malignant neoplasm of colon: Secondary | ICD-10-CM

## 2016-12-19 MED ORDER — LINACLOTIDE 145 MCG PO CAPS: 145.0000 ug | ORAL_CAPSULE | Freq: Every day | ORAL | 3 refills | Status: DC

## 2016-12-19 MED ORDER — PEG 3350-KCL-NA BICARB-NACL 420 G PO SOLR
4000.0000 mL | ORAL | 0 refills | Status: DC
Start: 2016-12-19 — End: 2017-01-04

## 2016-12-19 NOTE — Progress Notes (Signed)
CC'D TO PCP °

## 2016-12-19 NOTE — Assessment & Plan Note (Signed)
First ever screening colonoscopy in the near future.  I have discussed the risks, alternatives, benefits with regards to but not limited to the risk of reaction to medication, bleeding, infection, perforation and the patient is agreeable to proceed. Written consent to be obtained.  She has been advised to reduce her metformin day before procedure and hold the morning of. She is in the process of starting insulin and she was provided instructions for that dose reduction as well if she is on it before she has her colonoscopy.

## 2016-12-19 NOTE — Patient Instructions (Signed)
1. Start linzess 133mcg daily on empty stomach for constipation. Samples provided. RX sent to pharmacy. MAKE SURE YOU SAVE 5 CAPSULES TO TAKE STARTING 5 DAYS BEFORE YOUR BOWEL PREP FOR YOUR COLONOSCOPY.  2. If you start insulin between now and your procedure, you will need to take 1/2 dose the day before your procedure and hold the morning of your procedure. You will do the same with the metformin.

## 2016-12-19 NOTE — Assessment & Plan Note (Addendum)
Start Linzess 145 g daily on an empty stomach. Samples provided. Specifically provided instructions to make sure that she has medication for 5 days leading up into her colonoscopy bowel prep to ensure adequate bowel preparation. Patient voiced understanding. Prescription sent to pharmacy.

## 2016-12-19 NOTE — Progress Notes (Addendum)
REVIEWED-NO ADDITIONAL RECOMMENDATIONS.  Primary Care Physician:  Jani Gravel, MD  Primary Gastroenterologist:  Barney Drain, MD   Chief Complaint  Patient presents with  . Constipation  . Colonoscopy    HPI:  Alexis Landry is a 46 y.o. female here at the request of PCP for first ever screening colonoscopy. She also has chronic constipation as long as she remembers. BM 3 per week. No brbpr. Has tried MiraLAX in the past but did not like it. No abdominal pain. No heartburn lately. No dysphagia. No melena. No family history of colon cancer. Currently on metformin, plans to start insulin in the near future once her insurance has approved it.   Current Outpatient Prescriptions  Medication Sig Dispense Refill  . aspirin 81 MG tablet Take 81 mg by mouth daily.    Marland Kitchen atorvastatin (LIPITOR) 10 MG tablet Take 10 mg by mouth daily.    . cloNIDine (CATAPRES) 0.1 MG tablet Take 0.1 mg by mouth daily.    . ergocalciferol (VITAMIN D2) 50000 UNITS capsule Take 50,000 Units by mouth once a week.    . losartan (COZAAR) 50 MG tablet Take 50 mg by mouth daily.    . meloxicam (MOBIC) 7.5 MG tablet Take 7.5 mg by mouth daily.    . metFORMIN (GLUCOPHAGE) 500 MG tablet Take by mouth 2 (two) times daily with a meal.     No current facility-administered medications for this visit.     Allergies as of 12/19/2016 - Review Complete 12/19/2016  Allergen Reaction Noted  . Lisinopril  02/24/2015  . Sulfa antibiotics  08/03/2016    Past Medical History:  Diagnosis Date  . DM (diabetes mellitus), type 2, uncontrolled ([place]) 02/24/2015  . GERD (gastroesophageal reflux disease) 02/24/2015  . HTN (hypertension) 02/24/2015  . Hyperlipidemia 02/24/2015  . Rheumatoid arthritis ([place]) 02/24/2015  . Vitamin D deficiency disease 02/24/2015    Past Surgical History:  Procedure Laterality Date  . TUBAL LIGATION      Family History  Problem Relation Age of Onset  . Heart disease Mother   . Kidney disease Father   .  Diabetes Maternal Grandfather   . Diabetes Paternal Grandmother   . Diabetes Paternal Grandfather   . Colon cancer Neg Hx     Social History   Social History  . Marital status: Legally Separated    Spouse name: N/A  . Number of children: N/A  . 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 Smoking status: Never Smoker  . Smokeless tobacco: Never Used  . Alcohol use 0.0 oz/week     Comment: two times per month  . Drug use: No  . Sexual activity: Not Currently    Birth control/ protection: Surgical   Other Topics Concern  . Not on file   Social History Narrative  . No narrative on file      ROS:  General: Negative for anorexia, weight loss, fever, chills, fatigue, weakness. Eyes: Negative for vision changes.  ENT: Negative for hoarseness, difficulty swallowing , nasal congestion. CV: Negative for chest pain, angina, palpitations, dyspnea on exertion, peripheral edema.  Respiratory: Negative for dyspnea at rest, dyspnea on exertion, cough, sputum, wheezing.  GI: See history of present illness. GU:  Negative for dysuria, hematuria, urinary incontinence, urinary frequency, nocturnal urination.  MS: Negative for joint pain, low back pain.  Derm: Negative for rash or itching.  Neuro: Negative for weakness, abnormal  sensation, seizure, frequent headaches, memory loss, confusion.  Psych: Negative for anxiety, depression, suicidal ideation, hallucinations.  Endo: Negative for unusual weight change.  Heme: Negative for bruising or bleeding. Allergy: Negative for rash or hives.    Physical Examination:  BP (!) 142/87   Pulse 82   Temp 98.1 F (36.7 C) (Oral)   Ht 5\' 6"  (1.676 m)   Wt 196 lb 12.8 oz (89.3 kg)   BMI 31.76 kg/m    General: Well-nourished, well-developed in no acute distress.  Head: Normocephalic, atraumatic.   Eyes: Conjunctiva pink, no icterus. Mouth: Oropharyngeal mucosa moist and  pink , no lesions erythema or exudate. Neck: Supple without thyromegaly, masses, or lymphadenopathy.  Lungs: Clear to auscultation bilaterally.  Heart: Regular rate and rhythm, no murmurs rubs or gallops.  Abdomen: Bowel sounds are normal, nontender, nondistended, no hepatosplenomegaly or masses, no abdominal bruits or    hernia , no rebound or guarding.   Rectal: deferred Extremities: No lower extremity edema. No clubbing or deformities.  Neuro: Alert and oriented x 4 , grossly normal neurologically.  Skin: Warm and dry, no rash or jaundice.   Psych: Alert and cooperative, normal mood and affect.

## 2017-01-03 ENCOUNTER — Telehealth: Payer: Self-pay

## 2017-01-03 NOTE — Telephone Encounter (Signed)
PLEASE CALL PT. Ok to continue prep for Tcs tomorrow. Clear liquids today.

## 2017-01-03 NOTE — Telephone Encounter (Signed)
Pt called office. She is scheduled for TCS with Dr. Oneida Alar tomorrow. She forgot to start clear liquids yesterday after lunch (she was given Tri-Lyte prep). She has only had water today. Advised pt to continue with clear liquids and I would send message to Dr. Oneida Alar to make sure it is ok for her to proceed with prep.  Routing to [facility].

## 2017-01-03 NOTE — Telephone Encounter (Signed)
Called and informed pt.  

## 2017-01-04 ENCOUNTER — Encounter (HOSPITAL_COMMUNITY): Payer: Self-pay | Admitting: *Deleted

## 2017-01-04 ENCOUNTER — Encounter (HOSPITAL_COMMUNITY)
Admission: RE | Disposition: A | Discharge status: Home / Self Care | Payer: Self-pay | Source: Ambulatory Visit | Attending: Gastroenterology

## 2017-01-04 ENCOUNTER — Ambulatory Visit (HOSPITAL_COMMUNITY)
Admission: RE | Admit: 2017-01-04 | Discharge: 2017-01-04 | Disposition: A | Discharge status: Home / Self Care | Payer: BLUE CROSS/BLUE SHIELD | Source: Ambulatory Visit | Attending: Gastroenterology | Admitting: Gastroenterology

## 2017-01-04 DIAGNOSIS — Z1212 Encounter for screening for malignant neoplasm of rectum: Secondary | ICD-10-CM

## 2017-01-04 DIAGNOSIS — Z7984 Long term (current) use of oral hypoglycemic drugs: Secondary | ICD-10-CM | POA: Diagnosis not present

## 2017-01-04 DIAGNOSIS — Q438 Other specified congenital malformations of intestine: Secondary | ICD-10-CM | POA: Diagnosis not present

## 2017-01-04 DIAGNOSIS — K219 Gastro-esophageal reflux disease without esophagitis: Secondary | ICD-10-CM | POA: Diagnosis not present

## 2017-01-04 DIAGNOSIS — E559 Vitamin D deficiency, unspecified: Secondary | ICD-10-CM | POA: Diagnosis not present

## 2017-01-04 DIAGNOSIS — Z7982 Long term (current) use of aspirin: Secondary | ICD-10-CM | POA: Diagnosis not present

## 2017-01-04 DIAGNOSIS — E119 Type 2 diabetes mellitus without complications: Secondary | ICD-10-CM | POA: Insufficient documentation

## 2017-01-04 DIAGNOSIS — Z79899 Other long term (current) drug therapy: Secondary | ICD-10-CM | POA: Insufficient documentation

## 2017-01-04 DIAGNOSIS — E785 Hyperlipidemia, unspecified: Secondary | ICD-10-CM | POA: Diagnosis not present

## 2017-01-04 DIAGNOSIS — Z1211 Encounter for screening for malignant neoplasm of colon: Secondary | ICD-10-CM | POA: Diagnosis not present

## 2017-01-04 DIAGNOSIS — K5900 Constipation, unspecified: Secondary | ICD-10-CM

## 2017-01-04 DIAGNOSIS — M069 Rheumatoid arthritis, unspecified: Secondary | ICD-10-CM | POA: Insufficient documentation

## 2017-01-04 DIAGNOSIS — I10 Essential (primary) hypertension: Secondary | ICD-10-CM | POA: Diagnosis not present

## 2017-01-04 DIAGNOSIS — K648 Other hemorrhoids: Secondary | ICD-10-CM | POA: Diagnosis not present

## 2017-01-04 DIAGNOSIS — K5730 Diverticulosis of large intestine without perforation or abscess without bleeding: Secondary | ICD-10-CM | POA: Diagnosis not present

## 2017-01-04 HISTORY — PX: COLONOSCOPY: SHX5424

## 2017-01-04 LAB — GLUCOSE, CAPILLARY: Glucose-Capillary: 222 mg/dL — ABNORMAL HIGH (ref 65–99)

## 2017-01-04 SURGERY — COLONOSCOPY
Anesthesia: Moderate Sedation

## 2017-01-04 MED ORDER — MIDAZOLAM HCL 5 MG/5ML IJ SOLN
INTRAMUSCULAR | Status: DC | PRN
  Administered 2017-01-04 (×2): 2 mg via INTRAVENOUS

## 2017-01-04 MED ORDER — MIDAZOLAM HCL 5 MG/5ML IJ SOLN
INTRAMUSCULAR | Status: AC
  Filled 2017-01-04: qty 10

## 2017-01-04 MED ORDER — MEPERIDINE HCL 100 MG/ML IJ SOLN
INTRAMUSCULAR | Status: DC | PRN
  Administered 2017-01-04: 25 mg via INTRAVENOUS
  Administered 2017-01-04: 50 mg via INTRAVENOUS

## 2017-01-04 MED ORDER — MEPERIDINE HCL 100 MG/ML IJ SOLN
INTRAMUSCULAR | Status: AC
  Filled 2017-01-04: qty 2

## 2017-01-04 MED ORDER — STERILE WATER FOR IRRIGATION IR SOLN
Status: DC | PRN
  Administered 2017-01-04: 100 mL

## 2017-01-04 MED ORDER — SODIUM CHLORIDE 0.9 % IV SOLN
INTRAVENOUS | Status: DC
  Administered 2017-01-04: 1000 mL via INTRAVENOUS

## 2017-01-04 NOTE — H&P (Signed)
Primary Care Physician:  Jani Gravel, MD Primary Gastroenterologist:  Dr. Oneida Alar  Pre-Procedure History & Physical: HPI:  Alexis Landry is a 46 y.o. female here for [place].  Past Medical History:  Diagnosis Date  . DM (diabetes mellitus), type 2, uncontrolled ([place]) 02/24/2015  . GERD (gastroesophageal reflux disease) 02/24/2015  . HTN (hypertension) 02/24/2015  . Hyperlipidemia 02/24/2015  . Rheumatoid arthritis ([place]) 02/24/2015  . Vitamin D deficiency disease 02/24/2015    Past Surgical History:  Procedure Laterality Date  . TUBAL LIGATION      Prior to Admission medications   Medication Sig Start Date End Date Taking? Authorizing Provider  aspirin 81 MG tablet Take 81 mg by mouth daily.   Yes Historical Provider, MD  atorvastatin (LIPITOR) 10 MG tablet Take 10 mg by mouth every evening.    Yes Historical Provider, MD  cloNIDine (CATAPRES) 0.1 MG tablet Take 0.1 mg by mouth every evening.    Yes Historical Provider, MD  ergocalciferol (VITAMIN D2) 50000 UNITS capsule Take 50,000 Units by mouth every Monday.    Yes Historical Provider, MD  losartan (COZAAR) 50 MG tablet Take 50 mg by mouth daily.   Yes Historical Provider, MD  meloxicam (MOBIC) 7.5 MG tablet Take 7.5 mg by mouth daily.   Yes Historical Provider, MD  metFORMIN (GLUCOPHAGE-XR) 500 MG 24 hr tablet Take 500 mg by mouth 2 (two) times daily.   Yes Historical Provider, MD  naproxen sodium (ANAPROX) 220 MG tablet Take 440 mg by mouth daily as needed (pain).   Yes Historical Provider, MD  polyethylene glycol-electrolytes (TRILYTE) 420 g solution Take 4,000 mLs by mouth as directed. 12/19/16  Yes Danie Binder, MD  SitaGLIPtin-MetFORMIN HCl (JANUMET XR) [phone] MG TB24 Take 1 tablet by mouth daily.   Yes Historical Provider, MD  linaclotide Rolan Lipa) 145 MCG CAPS capsule Take 1 capsule (145 mcg total) by mouth daily before breakfast. 12/19/16   Mahala Menghini, PA-C    Allergies as of 12/19/2016 - Review Complete  12/19/2016  Allergen Reaction Noted  . Lisinopril  02/24/2015  . Sulfa antibiotics  08/03/2016    Family History  Problem Relation Age of Onset  . Heart disease Mother   . Kidney disease Father   . Diabetes Maternal Grandfather   . Diabetes Paternal Grandmother   . Diabetes Paternal Grandfather   . Colon cancer Neg Hx     Social History   Social History  . Marital status: Married    Spouse name: N/A  . Number of children: N/A  . 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 Smoking status: Never Smoker  . Smokeless tobacco: Never Used  . Alcohol use 0.0 oz/week     Comment: two times per month  . Drug use: No  . Sexual activity: Not Currently    Birth control/ protection: Surgical   Other Topics Concern  . Not on file   Social History Narrative  . No narrative on file    Review of Systems: See HPI, otherwise negative ROS   Physical Exam: BP (!) 154/81   Pulse 82   Temp 98.9 F (37.2 C) (Oral)   Resp 13   Ht 5\' 6"  (1.676 m)   Wt 197 lb (89.4 kg)   LMP 09/19/2016 (Approximate)   SpO2 99%   BMI 31.80 kg/m  General:   Alert,  pleasant and cooperative in NAD  Head:  Normocephalic and atraumatic. Neck:  Supple; Lungs:  Clear throughout to auscultation.    Heart:  Regular rate and rhythm. Abdomen:  Soft, nontender and nondistended. Normal bowel sounds, without guarding, and without rebound.   Neurologic:  Alert and  oriented x4;  grossly normal neurologically.  Impression/Plan:     SCREENING  Plan:  1. TCS TODAY. DISCUSSED PROCEDURE, BENEFITS, & RISKS: < 1% chance of medication reaction, bleeding, perforation, or rupture of spleen/liver.

## 2017-01-04 NOTE — Discharge Instructions (Signed)
You have SMALL internal hemorrhoids. YOU DID NOT HAVE ANY POLYPS.    DRINK WATER TO KEEP YOUR URINE LIGHT YELLOW.  CONTINUE YOUR WEIGHT LOSS EFFORTS. Lose 10-120 lbs.  WHILE I DO NOT WANT TO ALARM YOU, YOUR BODY MASS INDEX IS OVER 30 WHICH MEANS YOU ARE OBESE. Obesity turns on cancer genes. OBESITY IS ASSOCIATED WITH AN INCREASE RISK FOR ALL CANCERS, INCLUDING ESOPHAGEAL AND COLON CANCER.  FOLLOW A HIGH FIBER DIET. AVOID ITEMS THAT CAUSE BLOATING & GAS. SEE INFO BELOW.  USE PREPARATION H FOUR TIMES  A DAY IF NEEDED TO RELIEVE RECTAL PAIN/PRESSURE/BLEEDING.  Next colonoscopy in 10 years.   Colonoscopy Care After Read the instructions outlined below and refer to this sheet in the next week. These discharge instructions provide you with general information on caring for yourself after you leave the hospital. While your treatment has been planned according to the most current medical practices available, unavoidable complications occasionally occur. If you have any problems or questions after discharge, call DR. Ishmeal Rorie, [phone].  ACTIVITY  You may resume your regular activity, but move at a slower pace for the next 24 hours.   Take frequent rest periods for the next 24 hours.   Walking will help get rid of the air and reduce the bloated feeling in your belly (abdomen).   No driving for 24 hours (because of the medicine (anesthesia) used during the test).   You may shower.   Do not sign any important legal documents or operate any machinery for 24 hours (because of the anesthesia used during the test).    NUTRITION  Drink plenty of fluids.   You may resume your normal diet as instructed by your doctor.   Begin with a light meal and progress to your normal diet. Heavy or fried foods are harder to digest and may make you feel sick to your stomach (nauseated).   Avoid alcoholic beverages for 24 hours or as instructed.    MEDICATIONS  You may resume your normal  medications.   WHAT YOU CAN EXPECT TODAY  Some feelings of bloating in the abdomen.   Passage of more gas than usual.   Spotting of blood in your stool or on the toilet paper  .  IF YOU HAD POLYPS REMOVED DURING THE COLONOSCOPY:  Eat a soft diet IF YOU HAVE NAUSEA, BLOATING, ABDOMINAL PAIN, OR VOMITING.    FINDING OUT THE RESULTS OF YOUR TEST Not all test results are available during your visit. DR. Oneida Alar WILL CALL YOU WITHIN 14 DAYS OF YOUR PROCEDUE WITH YOUR RESULTS. Do not assume everything is normal if you have not heard from DR. Pranit Owensby, CALL HER OFFICE AT [phone].  SEEK IMMEDIATE MEDICAL ATTENTION AND CALL THE OFFICE: [phone] IF:  You have more than a spotting of blood in your stool.   Your belly is swollen (abdominal distention).   You are nauseated or vomiting.   You have a temperature over 101F.   You have abdominal pain or discomfort that is severe or gets worse throughout the day.   High-Fiber Diet A high-fiber diet changes your normal diet to include more whole grains, legumes, fruits, and vegetables. Changes in the diet involve replacing refined carbohydrates with unrefined foods. The calorie level of the diet is essentially unchanged. The Dietary Reference Intake (recommended amount) for adult males is 38 grams per day. For adult females, it is 25 grams per day. Pregnant and lactating women should consume 28 grams of fiber per day. Fiber is  the intact part of a plant that is not broken down during digestion. Functional fiber is fiber that has been isolated from the plant to provide a beneficial effect in the body. PURPOSE  Increase stool bulk.   Ease and regulate bowel movements.   Lower cholesterol.   REDUCE RISK OF COLON CANCER  INDICATIONS THAT YOU NEED MORE FIBER  Constipation and hemorrhoids.   Uncomplicated diverticulosis (intestine condition) and irritable bowel syndrome.   Weight management.   As a protective measure against  hardening of the arteries (atherosclerosis), diabetes, and cancer.   GUIDELINES FOR INCREASING FIBER IN THE DIET  Start adding fiber to the diet slowly. A gradual increase of about 5 more grams (2 slices of whole-wheat bread, 2 servings of most fruits or vegetables, or 1 bowl of high-fiber cereal) per day is best. Too rapid an increase in fiber may result in constipation, flatulence, and bloating.   Drink enough water and fluids to keep your urine clear or pale yellow. Water, juice, or caffeine-free drinks are recommended. Not drinking enough fluid may cause constipation.   Eat a variety of high-fiber foods rather than one type of fiber.   Try to increase your intake of fiber through using high-fiber foods rather than fiber pills or supplements that contain small amounts of fiber.   The goal is to change the types of food eaten. Do not supplement your present diet with high-fiber foods, but replace foods in your present diet.   INCLUDE A VARIETY OF FIBER SOURCES  Replace refined and processed grains with whole grains, canned fruits with fresh fruits, and incorporate other fiber sources. White rice, white breads, and most bakery goods contain little or no fiber.   Brown whole-grain rice, buckwheat oats, and many fruits and vegetables are all good sources of fiber. These include: broccoli, Brussels sprouts, cabbage, cauliflower, beets, sweet potatoes, white potatoes (skin on), carrots, tomatoes, eggplant, squash, berries, fresh fruits, and dried fruits.   Cereals appear to be the richest source of fiber. Cereal fiber is found in whole grains and bran. Bran is the fiber-rich outer coat of cereal grain, which is largely removed in refining. In whole-grain cereals, the bran remains. In breakfast cereals, the largest amount of fiber is found in those with "bran" in their names. The fiber content is sometimes indicated on the label.   You may need to include additional fruits and vegetables each day.    In baking, for 1 cup white flour, you may use the following substitutions:   1 cup whole-wheat flour minus 2 tablespoons.   1/2 cup white flour plus 1/2 cup whole-wheat flour.   Polyps, Colon  A polyp is extra tissue that grows inside your body. Colon polyps grow in the large intestine. The large intestine, also called the colon, is part of your digestive system. It is a long, hollow tube at the end of your digestive tract where your body makes and stores stool. Most polyps are not dangerous. They are benign. This means they are not cancerous. But over time, some types of polyps can turn into cancer. Polyps that are smaller than a pea are usually not harmful. But larger polyps could someday become or may already be cancerous. To be safe, doctors remove all polyps and test them.   PREVENTION There is not one sure way to prevent polyps. You might be able to lower your risk of getting them if you:  Eat more fruits and vegetables and less fatty food.   Do  not smoke.   Avoid alcohol.   Exercise every day.   Lose weight if you are overweight.   Eating more calcium and folate can also lower your risk of getting polyps. Some foods that are rich in calcium are milk, cheese, and broccoli. Some foods that are rich in folate are chickpeas, kidney beans, and spinach.   Hemorrhoids Hemorrhoids are dilated (enlarged) veins around the rectum. Sometimes clots will form in the veins. This makes them swollen and painful. These are called thrombosed hemorrhoids. Causes of hemorrhoids include:  Constipation.   Straining to have a bowel movement.   HEAVY LIFTING  HOME CARE INSTRUCTIONS  Eat a well balanced diet and drink 6 to 8 glasses of water every day to avoid constipation. You may also use a bulk laxative.   Avoid straining to have bowel movements.   Keep anal area dry and clean.   Do not use a donut shaped pillow or sit on the toilet for long periods. This increases blood pooling and  pain.   Move your bowels when your body has the urge; this will require less straining and will decrease pain and pressure.

## 2017-01-04 NOTE — Op Note (Signed)
[facility] Patient Name: Alexis Landry Procedure Date: 01/04/2017 10:06 AM MRN: [id number] Date of Birth: [birth year] Attending MD: Barney Drain , MD CSN: [id number] Age: 46 Admit Type: Outpatient Procedure:                Colonoscopy, SCREENING Indications:              Screening for colorectal malignant neoplasm Providers:                Barney Drain, MD, Janeece Riggers, RN, Rosina Lowenstein, RN Referring MD:             Brynda Greathouse, MD Medicines:                Meperidine 75 mg IV, Midazolam 4 mg IV Complications:            No immediate complications. Estimated Blood Loss:     Estimated blood loss: none. Procedure:                Pre-Anesthesia Assessment:                           - Prior to the procedure, a History and Physical                            was performed, and patient medications and                            allergies were reviewed. The patient's tolerance of                            previous anesthesia was also reviewed. The risks                            and benefits of the procedure and the sedation                            options and risks were discussed with the patient.                            All questions were answered, and informed consent                            was obtained. Prior Anticoagulants: The patient has                            taken aspirin, last dose was 1 day prior to                            procedure. ASA Grade Assessment: II - A patient                            with mild systemic disease. After reviewing the                            risks and benefits, the patient was deemed in  satisfactory condition to undergo the procedure.                            After obtaining informed consent, the colonoscope                            was passed under direct vision. Throughout the                            procedure, the patient's blood pressure, pulse, and                            oxygen  saturations were monitored continuously. The                            EC-[id number] ([id number]) scope was introduced through                            the anus and advanced to the the cecum, identified                            by appendiceal orifice and ileocecal valve. The                            colonoscopy was somewhat difficult due to a                            tortuous colon. Successful completion of the                            procedure was aided by COLOWRAP. The patient                            tolerated the procedure well. The quality of the                            bowel preparation was excellent. The ileocecal                            valve, appendiceal orifice, and rectum were                            photographed. Scope In: 10:31:16 AM Scope Out: 10:46:53 AM Scope Withdrawal Time: 0 hours 13 minutes 3 seconds  Total Procedure Duration: 0 hours 15 minutes 37 seconds  Findings:      The recto-sigmoid colon and sigmoid colon were mildly redundant.      Multiple small and large-mouthed diverticula were found in the       recto-sigmoid colon, sigmoid colon, descending colon and ascending colon.      Non-bleeding internal hemorrhoids were found during retroflexion. The       hemorrhoids were small. Impression:               - Redundant LEFT colon.                           -  Diverticulosis in the recto-sigmoid colon, in the                            sigmoid colon, in the descending colon and in the                            ascending colon.                           - Non-bleeding internal hemorrhoids. Moderate Sedation:      Moderate (conscious) sedation was administered by the endoscopy nurse       and supervised by the endoscopist. The following parameters were       monitored: oxygen saturation, heart rate, blood pressure, and response       to care. Total physician intraservice time was 27 minutes. Recommendation:           - Repeat colonoscopy in 10 years  for surveillance.                           - High fiber diet.                           - Continue present medications.                           - Patient has a contact number available for                            emergencies. The signs and symptoms of potential                            delayed complications were discussed with the                            patient. Return to normal activities tomorrow.                            Written discharge instructions were provided to the                            patient. Procedure Code(s):        --- Professional ---                           [id number], Colonoscopy, flexible; diagnostic, including                            collection of specimen(s) by brushing or washing,                            when performed (separate procedure)                           99152, Moderate sedation services provided by the  same physician or other qualified health care                            professional performing the diagnostic or                            therapeutic service that the sedation supports,                            requiring the presence of an independent trained                            observer to assist in the monitoring of the                            patient's level of consciousness and physiological                            status; initial 15 minutes of intraservice time,                            patient age 23 years or older                           [phone], Moderate sedation services; each additional                            15 minutes intraservice time Diagnosis Code(s):        --- Professional ---                           Z12.11, Encounter for screening for malignant                            neoplasm of colon                           K64.8, Other hemorrhoids                           K57.30, Diverticulosis of large intestine without                            perforation or abscess  without bleeding                           Q43.8, Other specified congenital malformations of                            intestine CPT copyright 2016 [employer]. All rights reserved. The codes documented in this report are preliminary and upon coder review may  be revised to meet current compliance requirements. Barney Drain, MD Barney Drain, MD 01/04/2017 10:59:48 AM This report has been signed electronically. Number of Addenda: 0

## 2017-01-06 ENCOUNTER — Encounter (HOSPITAL_COMMUNITY): Payer: Self-pay | Admitting: Gastroenterology

## 2018-04-26 ENCOUNTER — Other Ambulatory Visit (HOSPITAL_COMMUNITY): Payer: Self-pay | Admitting: Family Medicine

## 2018-04-26 DIAGNOSIS — Z1231 Encounter for screening mammogram for malignant neoplasm of breast: Secondary | ICD-10-CM

## 2018-05-02 ENCOUNTER — Encounter: Payer: Self-pay | Admitting: *Deleted

## 2018-05-04 ENCOUNTER — Ambulatory Visit (HOSPITAL_COMMUNITY)
Admission: RE | Admit: 2018-05-04 | Discharge: 2018-05-04 | Disposition: A | Discharge status: Home / Self Care | Payer: BLUE CROSS/BLUE SHIELD | Source: Ambulatory Visit | Attending: Family Medicine | Admitting: Family Medicine

## 2018-05-04 DIAGNOSIS — Z1231 Encounter for screening mammogram for malignant neoplasm of breast: Secondary | ICD-10-CM | POA: Insufficient documentation

## 2018-08-11 ENCOUNTER — Emergency Department (HOSPITAL_COMMUNITY): Payer: BLUE CROSS/BLUE SHIELD

## 2018-08-11 ENCOUNTER — Other Ambulatory Visit: Payer: Self-pay

## 2018-08-11 ENCOUNTER — Encounter (HOSPITAL_COMMUNITY): Payer: Self-pay | Admitting: Emergency Medicine

## 2018-08-11 ENCOUNTER — Emergency Department (HOSPITAL_COMMUNITY)
Admission: EM | Admit: 2018-08-11 | Discharge: 2018-08-11 | Disposition: A | Discharge status: Home / Self Care | Payer: BLUE CROSS/BLUE SHIELD | Attending: Emergency Medicine | Admitting: Emergency Medicine

## 2018-08-11 DIAGNOSIS — I10 Essential (primary) hypertension: Secondary | ICD-10-CM | POA: Diagnosis not present

## 2018-08-11 DIAGNOSIS — Y9389 Activity, other specified: Secondary | ICD-10-CM | POA: Insufficient documentation

## 2018-08-11 DIAGNOSIS — Z7982 Long term (current) use of aspirin: Secondary | ICD-10-CM | POA: Diagnosis not present

## 2018-08-11 DIAGNOSIS — S161XXA Strain of muscle, fascia and tendon at neck level, initial encounter: Secondary | ICD-10-CM | POA: Diagnosis not present

## 2018-08-11 DIAGNOSIS — Y998 Other external cause status: Secondary | ICD-10-CM | POA: Diagnosis not present

## 2018-08-11 DIAGNOSIS — Y92481 Parking lot as the place of occurrence of the external cause: Secondary | ICD-10-CM | POA: Diagnosis not present

## 2018-08-11 DIAGNOSIS — Z7984 Long term (current) use of oral hypoglycemic drugs: Secondary | ICD-10-CM | POA: Insufficient documentation

## 2018-08-11 DIAGNOSIS — S8392XA Sprain of unspecified site of left knee, initial encounter: Secondary | ICD-10-CM

## 2018-08-11 DIAGNOSIS — S199XXA Unspecified injury of neck, initial encounter: Secondary | ICD-10-CM | POA: Diagnosis present

## 2018-08-11 DIAGNOSIS — E119 Type 2 diabetes mellitus without complications: Secondary | ICD-10-CM | POA: Insufficient documentation

## 2018-08-11 DIAGNOSIS — Z79899 Other long term (current) drug therapy: Secondary | ICD-10-CM | POA: Diagnosis not present

## 2018-08-11 MED ORDER — METHOCARBAMOL 500 MG PO TABS: 500.0000 mg | ORAL_TABLET | Freq: Three times a day (TID) | ORAL | 0 refills | Status: DC
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
## 2018-08-11 NOTE — Discharge Instructions (Addendum)
Apply ice packs on and off to your neck.  Wear the knee sleeve as needed for support when walking or standing.  Follow-up with your primary doctor for recheck regarding your neck pain.  You may also contact Dr. Ruthe Mannan office to arrange a follow-up appointment in 1 week if your knee is not improving.

## 2018-11-23 ENCOUNTER — Emergency Department (HOSPITAL_COMMUNITY)
Admission: EM | Admit: 2018-11-23 | Discharge: 2018-11-23 | Disposition: A | Discharge status: Home / Self Care | Payer: BLUE CROSS/BLUE SHIELD | Attending: Emergency Medicine | Admitting: Emergency Medicine

## 2018-11-23 ENCOUNTER — Encounter (HOSPITAL_COMMUNITY): Payer: Self-pay | Admitting: Emergency Medicine

## 2018-11-23 ENCOUNTER — Emergency Department (HOSPITAL_COMMUNITY): Payer: BLUE CROSS/BLUE SHIELD

## 2018-11-23 DIAGNOSIS — E1165 Type 2 diabetes mellitus with hyperglycemia: Secondary | ICD-10-CM | POA: Diagnosis not present

## 2018-11-23 DIAGNOSIS — R509 Fever, unspecified: Secondary | ICD-10-CM

## 2018-11-23 DIAGNOSIS — R000 Tachycardia, unspecified: Secondary | ICD-10-CM | POA: Diagnosis not present

## 2018-11-23 DIAGNOSIS — I10 Essential (primary) hypertension: Secondary | ICD-10-CM | POA: Diagnosis not present

## 2018-11-23 DIAGNOSIS — Z79899 Other long term (current) drug therapy: Secondary | ICD-10-CM | POA: Insufficient documentation

## 2018-11-23 DIAGNOSIS — R05 Cough: Secondary | ICD-10-CM | POA: Diagnosis not present

## 2018-11-23 DIAGNOSIS — J069 Acute upper respiratory infection, unspecified: Secondary | ICD-10-CM

## 2018-11-23 DIAGNOSIS — E119 Type 2 diabetes mellitus without complications: Secondary | ICD-10-CM | POA: Diagnosis not present

## 2018-11-23 DIAGNOSIS — Z7984 Long term (current) use of oral hypoglycemic drugs: Secondary | ICD-10-CM | POA: Diagnosis not present

## 2018-11-23 LAB — CBC WITH DIFFERENTIAL/PLATELET
ABS IMMATURE GRANULOCYTES: 0.01 10*3/uL (ref 0.00–0.07)
Basophils Absolute: 0 10*3/uL (ref 0.0–0.1)
Basophils Relative: 0 %
Eosinophils Absolute: 0 10*3/uL (ref 0.0–0.5)
Eosinophils Relative: 0 %
HCT: 39.6 % (ref 36.0–46.0)
Hemoglobin: 12.7 g/dL (ref 12.0–15.0)
Immature Granulocytes: 0 %
Lymphocytes Relative: 31 %
Lymphs Abs: 1.2 10*3/uL (ref 0.7–4.0)
MCH: 27.3 pg (ref 26.0–34.0)
MCHC: 32.1 g/dL (ref 30.0–36.0)
MCV: 85 fL (ref 80.0–100.0)
MONO ABS: 0.3 10*3/uL (ref 0.1–1.0)
Monocytes Relative: 8 %
NEUTROS ABS: 2.4 10*3/uL (ref 1.7–7.7)
Neutrophils Relative %: 61 %
Platelets: 277 10*3/uL (ref 150–400)
RBC: 4.66 MIL/uL (ref 3.87–5.11)
RDW: 13 % (ref 11.5–15.5)
WBC: 3.9 10*3/uL — ABNORMAL LOW (ref 4.0–10.5)
nRBC: 0 % (ref 0.0–0.2)

## 2018-11-23 LAB — BASIC METABOLIC PANEL
Anion gap: 11 (ref 5–15)
BUN: 21 mg/dL — ABNORMAL HIGH (ref 6–20)
CO2: 25 mmol/L (ref 22–32)
Calcium: 8.4 mg/dL — ABNORMAL LOW (ref 8.9–10.3)
Chloride: 95 mmol/L — ABNORMAL LOW (ref 98–111)
Creatinine, Ser: 1.59 mg/dL — ABNORMAL HIGH (ref 0.44–1.00)
GFR calc Af Amer: 44 mL/min — ABNORMAL LOW (ref >60)
GFR calc non Af Amer: 38 mL/min — ABNORMAL LOW (ref >60)
Glucose, Bld: 411 mg/dL — ABNORMAL HIGH (ref 70–99)
POTASSIUM: 3.7 mmol/L (ref 3.5–5.1)
SODIUM: 131 mmol/L — AB (ref 135–145)

## 2018-11-23 MED ORDER — SODIUM CHLORIDE 0.9 % IV BOLUS
1000.0000 mL | Freq: Once | INTRAVENOUS | Status: AC
  Administered 2018-11-23: 1000 mL via INTRAVENOUS

## 2018-11-23 MED ORDER — ALBUTEROL SULFATE HFA 108 (90 BASE) MCG/ACT IN AERS
2.0000 | INHALATION_SPRAY | RESPIRATORY_TRACT | Status: DC
  Administered 2018-11-23: 2 via RESPIRATORY_TRACT
  Filled 2018-11-23: qty 6.7

## 2018-11-23 MED ORDER — ONDANSETRON HCL 4 MG/2ML IJ SOLN
4.0000 mg | Freq: Once | INTRAMUSCULAR | Status: AC
  Administered 2018-11-23: 4 mg via INTRAVENOUS
  Filled 2018-11-23: qty 2

## 2018-11-23 MED ORDER — IPRATROPIUM-ALBUTEROL 0.5-2.5 (3) MG/3ML IN SOLN
3.0000 mL | Freq: Once | RESPIRATORY_TRACT | Status: AC
  Administered 2018-11-23: 3 mL via RESPIRATORY_TRACT
  Filled 2018-11-23: qty 3

## 2018-11-23 MED ORDER — ACETAMINOPHEN 500 MG PO TABS
1000.0000 mg | ORAL_TABLET | Freq: Once | ORAL | Status: AC
  Administered 2018-11-23: 1000 mg via ORAL
  Filled 2018-11-23: qty 2

## 2018-11-23 NOTE — ED Triage Notes (Signed)
Onset 3 days, fever, non productive cough, weak and body aches

## 2018-11-23 NOTE — Discharge Instructions (Addendum)
Chest x-ray showed no pneumonia.  Increase fluids.  Tylenol for fever.  Use your inhaler as needed for coughing and wheezing.

## 2018-11-23 NOTE — ED Provider Notes (Signed)
[facility] EMERGENCY DEPARTMENT Provider Note   CSN: [id number] Arrival date & time: 11/23/18  1652    History   Chief Complaint Chief Complaint  Patient presents with  . Fever    HPI Alexis Landry is a 48 y.o. female.     Fever, cough, body aches for 3 days with decreased oral intake.  No rusty sputum, dysuria, chest pain, dyspnea.  Severity is moderate.  Nothing makes symptoms better or worse.     Past Medical History:  Diagnosis Date  . DM (diabetes mellitus), type 2, uncontrolled ([place]) 02/24/2015  . GERD (gastroesophageal reflux disease) 02/24/2015  . HTN (hypertension) 02/24/2015  . Hyperlipidemia 02/24/2015  . Rheumatoid arthritis ([place]) 02/24/2015  . Vitamin D deficiency disease 02/24/2015    Patient Active Problem List   Diagnosis Date Noted  . Encounter for screening colonoscopy 12/19/2016  . Constipation 12/19/2016  . Vitamin D deficiency disease 02/24/2015  . HTN (hypertension) 02/24/2015  . Hyperlipidemia 02/24/2015  . GERD (gastroesophageal reflux disease) 02/24/2015  . DM (diabetes mellitus), type 2, uncontrolled ([place]) 02/24/2015  . Rheumatoid arthritis ([place]) 02/24/2015    Past Surgical History:  Procedure Laterality Date  . COLONOSCOPY N/A 01/04/2017   Procedure: COLONOSCOPY;  Surgeon: Danie Binder, MD;  Location: [facility];  Service: Endoscopy;  Laterality: N/A;  10:30am  . TUBAL LIGATION       OB History    Gravida  5   Para  4   Term      Preterm      AB  1   Living  4     SAB      TAB      Ectopic      Multiple      Live Births               Home Medications    Prior to Admission medications   Medication Sig Start Date End Date Taking? Authorizing Provider  amoxicillin (AMOXIL) 500 MG capsule Take 500 mg by mouth 2 (two) times daily as needed (for dental pain).  05/17/18  Yes [provider]  atorvastatin (LIPITOR) 10 MG tablet Take 10 mg by mouth every evening.    Yes [provider]    cloNIDine (CATAPRES) 0.1 MG tablet Take 0.1 mg by mouth every evening.    Yes [provider]  JANUVIA 100 MG tablet Take 100 mg by mouth daily. 05/17/18  Yes [provider]  losartan (COZAAR) 50 MG tablet Take 50 mg by mouth daily.   Yes [provider]  meloxicam (MOBIC) 7.5 MG tablet Take 7.5 mg by mouth daily.   Yes [provider]  metFORMIN (GLUCOPHAGE-XR) 500 MG 24 hr tablet Take 500 mg by mouth 2 (two) times daily.   Yes [provider]  TRESIBA FLEXTOUCH 100 UNIT/ML SOPN FlexTouch Pen Inject 12 Units into the skin at bedtime.  05/08/18  Yes [provider]    Family History Family History  Problem Relation Age of Onset  . Heart disease Mother   . Kidney disease Father   . Diabetes Maternal Grandfather   . Diabetes Paternal Grandmother   . Diabetes Paternal Grandfather   . Colon cancer Neg Hx     Social History Social History   Tobacco Use  . Smoking status: Never Smoker  . Smokeless tobacco: Never Used  Substance Use Topics  . Alcohol use: Yes    Alcohol/week: 0.0 standard drinks    Comment: two  times per month  . Drug use: No     Allergies   Lisinopril and Sulfa antibiotics   Review of Systems Review of Systems  All other systems reviewed and are negative.    Physical Exam Updated Vital Signs BP (!) 164/79   Pulse 80   Temp (!) 101.5 F (38.6 C)   Resp 20   Ht 5\' 6"  (1.676 m)   Wt 99.3 kg   LMP 09/19/2016 (Approximate)   SpO2 98%   BMI 35.35 kg/m   Physical Exam Vitals signs and nursing note reviewed.  Constitutional:      Appearance: She is well-developed.     Comments: nad  HENT:     Head: Normocephalic and atraumatic.  Eyes:     Conjunctiva/sclera: Conjunctivae normal.  Neck:     Musculoskeletal: Neck supple.  Cardiovascular:     Rate and Rhythm: Normal rate and regular rhythm.  Pulmonary:     Effort: Pulmonary effort is normal.     Breath sounds: Normal breath sounds.   Abdominal:     General: Bowel sounds are normal.     Palpations: Abdomen is soft.  Musculoskeletal: Normal range of motion.  Skin:    General: Skin is warm and dry.  Neurological:     Mental Status: She is alert and oriented to person, place, and time.  Psychiatric:        Behavior: Behavior normal.      ED Treatments / Results  Labs (all labs ordered are listed, but only abnormal results are displayed) Labs Reviewed  CBC WITH DIFFERENTIAL/PLATELET - Abnormal; Notable for the following components:      Result Value   WBC 3.9 (*)    All other components within normal limits  BASIC METABOLIC PANEL - Abnormal; Notable for the following components:   Sodium 131 (*)    Chloride 95 (*)    Glucose, Bld 411 (*)    BUN 21 (*)    Creatinine, Ser 1.59 (*)    Calcium 8.4 (*)    GFR calc non Af Amer 38 (*)    GFR calc Af Amer 44 (*)    All other components within normal limits  URINALYSIS, ROUTINE W REFLEX MICROSCOPIC    EKG None  Radiology Dg Chest 2 View  Result Date: 11/23/2018 CLINICAL DATA:  Cough and fever EXAM: CHEST - 2 VIEW COMPARISON:  07/18/2010 FINDINGS: Cardiac enlargement. Negative for heart failure. Lungs are clear without infiltrate or effusion IMPRESSION: No active cardiopulmonary disease. Electronically Signed   By: Franchot Gallo M.D.   On: 11/23/2018 18:39    Procedures Procedures (including critical care time)  Medications Ordered in ED Medications  albuterol (PROVENTIL HFA;VENTOLIN HFA) 108 (90 Base) MCG/ACT inhaler 2 puff (has no administration in time range)  sodium chloride 0.9 % bolus 1,000 mL (1,000 mLs Intravenous New Bag/Given 11/23/18 1755)  ondansetron (ZOFRAN) injection 4 mg (4 mg Intravenous Given 11/23/18 1748)  acetaminophen (TYLENOL) tablet 1,000 mg (1,000 mg Oral Given 11/23/18 1752)  ipratropium-albuterol (DUONEB) 0.5-2.5 (3) MG/3ML nebulizer solution 3 mL (3 mLs Nebulization Given 11/23/18 1833)     Initial Impression / Assessment and  Plan / ED Course  I have reviewed the triage vital signs and the nursing notes.  Pertinent labs & imaging results that were available during my care of the patient were reviewed by me and considered in my medical decision making (see chart for details).        History and physical most consistent  with viral syndrome.  Chest x-ray shows no pneumonia.  Patient feels much better after IV fluids, Tylenol, DuoNeb nebulizer treatment.  Discharge medication Ventolin inhaler.  Final Clinical Impressions(s) / ED Diagnoses   Final diagnoses:  Fever, unspecified fever cause  Upper respiratory tract infection, unspecified type    ED Discharge Orders    None       Nat Christen, MD 11/23/18 1958

## 2018-11-23 NOTE — ED Notes (Signed)
Warm to touch, fever of 102.1

## 2018-11-28 DIAGNOSIS — H35371 Puckering of macula, right eye: Secondary | ICD-10-CM | POA: Diagnosis not present

## 2018-11-28 DIAGNOSIS — E113521 Type 2 diabetes mellitus with proliferative diabetic retinopathy with traction retinal detachment involving the macula, right eye: Secondary | ICD-10-CM | POA: Diagnosis not present

## 2018-11-28 DIAGNOSIS — E113592 Type 2 diabetes mellitus with proliferative diabetic retinopathy without macular edema, left eye: Secondary | ICD-10-CM | POA: Diagnosis not present

## 2018-11-28 DIAGNOSIS — H3582 Retinal ischemia: Secondary | ICD-10-CM | POA: Diagnosis not present

## 2018-11-29 ENCOUNTER — Other Ambulatory Visit: Payer: Self-pay | Admitting: Ophthalmology

## 2018-11-30 ENCOUNTER — Encounter (HOSPITAL_COMMUNITY): Payer: Self-pay | Admitting: *Deleted

## 2018-11-30 ENCOUNTER — Other Ambulatory Visit: Payer: Self-pay

## 2018-11-30 NOTE — Anesthesia Preprocedure Evaluation (Addendum)
Anesthesia Evaluation  Patient identified by MRN, date of birth, ID band Patient awake    Reviewed: Allergy & Precautions, NPO status , Patient's Chart, lab work & pertinent test results  Airway Mallampati: II  TM Distance: >3 FB Neck ROM: Full    Dental  (+) Dental Advisory Given, Poor Dentition, Missing,    Pulmonary    breath sounds clear to auscultation       Cardiovascular hypertension, Pt. on medications  Rhythm:Regular Rate:Normal     Neuro/Psych    GI/Hepatic   Endo/Other  diabetes, Poorly Controlled, Type 2, Oral Hypoglycemic Agents, Insulin Dependent  Renal/GU      Musculoskeletal   Abdominal   Peds  Hematology   Anesthesia Other Findings   Reproductive/Obstetrics                           Anesthesia Physical Anesthesia Plan  ASA: III  Anesthesia Plan: General   Post-op Pain Management:    Induction: Intravenous  PONV Risk Score and Plan: Ondansetron  Airway Management Planned: Oral ETT  Additional Equipment:   Intra-op Plan:   Post-operative Plan: Extubation in OR  Informed Consent: I have reviewed the patients History and Physical, chart, labs and discussed the procedure including the risks, benefits and alternatives for the proposed anesthesia with the patient or authorized representative who has indicated his/her understanding and acceptance.     Dental advisory given  Plan Discussed with: CRNA and Anesthesiologist  Anesthesia Plan Comments: (Same day workup pt. Hx of poorly controlled IDDMII. She reported last A1c 08/2018 was 11.0. Says fasting BG in low 200s. She said she was to have A1c rechecked by PCP 2/28, however PCP office closed when PAT nurse called for information. Dr. Baird Cancer aware of poorly controlled BG, understands case may be cancelled on DOS if not within acceptable range. )       Anesthesia Quick Evaluation

## 2018-11-30 NOTE — Progress Notes (Signed)
Spoke with pt for pre-op call. Pt denies cardiac history. Pt is a type 2 diabetic. She states her last A1C was 11.0 in November. She states she is going to have it done again today. She states in November Dr. Maudie Mercury increased her Tyler Aas insulin from 10 units to 12 units. She states her fasting blood sugar is usually in the low 200's. Instructed pt to take 1/2 of her regular dose of Tresiba Insulin Sunday PM. She will take 6 units. Instructed pt to check her blood sugar when she gets up and every 2 hours until she leaves for the hospital. If blood sugar is 70 or below, treat with 1/2 cup of clear juice (apple or cranberry) and recheck blood sugar 15 minutes after drinking juice. If blood sugar continues to be 70 or below, call the Short Stay department and ask to speak to a nurse. Pt voiced understanding.

## 2018-12-03 ENCOUNTER — Encounter (HOSPITAL_COMMUNITY)
Admission: RE | Disposition: A | Discharge status: Home / Self Care | Payer: Self-pay | Source: Home / Self Care | Attending: Ophthalmology

## 2018-12-03 ENCOUNTER — Encounter (HOSPITAL_COMMUNITY): Payer: Self-pay

## 2018-12-03 ENCOUNTER — Other Ambulatory Visit: Payer: Self-pay

## 2018-12-03 ENCOUNTER — Ambulatory Visit (HOSPITAL_COMMUNITY)
Admission: RE | Admit: 2018-12-03 | Discharge: 2018-12-03 | Disposition: A | Discharge status: Home / Self Care | Payer: BLUE CROSS/BLUE SHIELD | Attending: Ophthalmology | Admitting: Ophthalmology

## 2018-12-03 ENCOUNTER — Ambulatory Visit (HOSPITAL_COMMUNITY): Payer: BLUE CROSS/BLUE SHIELD | Admitting: Physician Assistant

## 2018-12-03 DIAGNOSIS — H3341 Traction detachment of retina, right eye: Secondary | ICD-10-CM | POA: Diagnosis not present

## 2018-12-03 DIAGNOSIS — Z841 Family history of disorders of kidney and ureter: Secondary | ICD-10-CM | POA: Diagnosis not present

## 2018-12-03 DIAGNOSIS — K219 Gastro-esophageal reflux disease without esophagitis: Secondary | ICD-10-CM | POA: Diagnosis not present

## 2018-12-03 DIAGNOSIS — Z882 Allergy status to sulfonamides status: Secondary | ICD-10-CM | POA: Insufficient documentation

## 2018-12-03 DIAGNOSIS — E11311 Type 2 diabetes mellitus with unspecified diabetic retinopathy with macular edema: Secondary | ICD-10-CM | POA: Diagnosis not present

## 2018-12-03 DIAGNOSIS — Z794 Long term (current) use of insulin: Secondary | ICD-10-CM | POA: Diagnosis not present

## 2018-12-03 DIAGNOSIS — Z79899 Other long term (current) drug therapy: Secondary | ICD-10-CM | POA: Diagnosis not present

## 2018-12-03 DIAGNOSIS — Z8249 Family history of ischemic heart disease and other diseases of the circulatory system: Secondary | ICD-10-CM | POA: Insufficient documentation

## 2018-12-03 DIAGNOSIS — Z833 Family history of diabetes mellitus: Secondary | ICD-10-CM | POA: Diagnosis not present

## 2018-12-03 DIAGNOSIS — E559 Vitamin D deficiency, unspecified: Secondary | ICD-10-CM | POA: Insufficient documentation

## 2018-12-03 DIAGNOSIS — E113511 Type 2 diabetes mellitus with proliferative diabetic retinopathy with macular edema, right eye: Secondary | ICD-10-CM | POA: Diagnosis not present

## 2018-12-03 DIAGNOSIS — Z888 Allergy status to other drugs, medicaments and biological substances status: Secondary | ICD-10-CM | POA: Diagnosis not present

## 2018-12-03 DIAGNOSIS — I10 Essential (primary) hypertension: Secondary | ICD-10-CM | POA: Insufficient documentation

## 2018-12-03 DIAGNOSIS — H4311 Vitreous hemorrhage, right eye: Secondary | ICD-10-CM | POA: Insufficient documentation

## 2018-12-03 DIAGNOSIS — H3581 Retinal edema: Secondary | ICD-10-CM | POA: Diagnosis not present

## 2018-12-03 DIAGNOSIS — M069 Rheumatoid arthritis, unspecified: Secondary | ICD-10-CM | POA: Insufficient documentation

## 2018-12-03 DIAGNOSIS — Z791 Long term (current) use of non-steroidal anti-inflammatories (NSAID): Secondary | ICD-10-CM | POA: Diagnosis not present

## 2018-12-03 DIAGNOSIS — E113521 Type 2 diabetes mellitus with proliferative diabetic retinopathy with traction retinal detachment involving the macula, right eye: Secondary | ICD-10-CM | POA: Insufficient documentation

## 2018-12-03 DIAGNOSIS — E785 Hyperlipidemia, unspecified: Secondary | ICD-10-CM | POA: Insufficient documentation

## 2018-12-03 HISTORY — PX: PARS PLANA VITRECTOMY: SHX2166

## 2018-12-03 HISTORY — PX: MEMBRANE PEEL: SHX5967

## 2018-12-03 HISTORY — DX: Type 2 diabetes mellitus with unspecified diabetic retinopathy with macular edema: E11.311

## 2018-12-03 HISTORY — PX: KENALOG INJECTION: SHX5298

## 2018-12-03 HISTORY — DX: Puckering of macula, right eye: H35.371

## 2018-12-03 HISTORY — DX: Type 2 diabetes mellitus with diabetic neuropathy, unspecified: E11.40

## 2018-12-03 HISTORY — PX: PHOTOCOAGULATION WITH LASER: SHX6027

## 2018-12-03 LAB — BASIC METABOLIC PANEL
Anion gap: 10 (ref 5–15)
BUN: 23 mg/dL — ABNORMAL HIGH (ref 6–20)
CO2: 25 mmol/L (ref 22–32)
Calcium: 8.9 mg/dL (ref 8.9–10.3)
Chloride: 106 mmol/L (ref 98–111)
Creatinine, Ser: 1.16 mg/dL — ABNORMAL HIGH (ref 0.44–1.00)
GFR calc Af Amer: >60 mL/min (ref >60)
GFR calc non Af Amer: 55 mL/min — ABNORMAL LOW (ref >60)
Glucose, Bld: 195 mg/dL — ABNORMAL HIGH (ref 70–99)
Potassium: 3.4 mmol/L — ABNORMAL LOW (ref 3.5–5.1)
Sodium: 141 mmol/L (ref 135–145)

## 2018-12-03 LAB — GLUCOSE, CAPILLARY
Glucose-Capillary: 178 mg/dL — ABNORMAL HIGH (ref 70–99)
Glucose-Capillary: 183 mg/dL — ABNORMAL HIGH (ref 70–99)
Glucose-Capillary: 162 mg/dL — ABNORMAL HIGH (ref 70–99)

## 2018-12-03 SURGERY — PARS PLANA VITRECTOMY WITH 25 GAUGE
Anesthesia: General | Site: Eye | Laterality: Right

## 2018-12-03 MED ORDER — OXYCODONE HCL 5 MG/5ML PO SOLN: 5.0000 mg | Freq: Once | ORAL | Status: DC | PRN

## 2018-12-03 MED ORDER — EPINEPHRINE PF 1 MG/ML IJ SOLN
INTRAOCULAR | Status: DC | PRN
  Administered 2018-12-03 (×2): 500 mL

## 2018-12-03 MED ORDER — CEFAZOLIN SUBCONJUNCTIVAL INJECTION 100 MG/0.5 ML
INJECTION | SUBCONJUNCTIVAL | Status: DC | PRN
  Administered 2018-12-03: 100 mg via SUBCONJUNCTIVAL

## 2018-12-03 MED ORDER — TETRACAINE HCL 0.5 % OP SOLN
OPHTHALMIC | Status: AC
  Filled 2018-12-03: qty 4

## 2018-12-03 MED ORDER — PHENYLEPHRINE 40 MCG/ML (10ML) SYRINGE FOR IV PUSH (FOR BLOOD PRESSURE SUPPORT)
PREFILLED_SYRINGE | INTRAVENOUS | Status: DC | PRN
  Administered 2018-12-03 (×8): 80 ug via INTRAVENOUS

## 2018-12-03 MED ORDER — PHENYLEPHRINE 40 MCG/ML (10ML) SYRINGE FOR IV PUSH (FOR BLOOD PRESSURE SUPPORT)
PREFILLED_SYRINGE | INTRAVENOUS | Status: AC
  Filled 2018-12-03: qty 10

## 2018-12-03 MED ORDER — FENTANYL CITRATE (PF) 100 MCG/2ML IJ SOLN: 25.0000 ug | INTRAMUSCULAR | Status: DC | PRN

## 2018-12-03 MED ORDER — ONDANSETRON HCL 4 MG/2ML IJ SOLN
INTRAMUSCULAR | Status: AC
  Filled 2018-12-03: qty 2

## 2018-12-03 MED ORDER — EPINEPHRINE PF 1 MG/ML IJ SOLN
INTRAMUSCULAR | Status: AC
  Filled 2018-12-03: qty 1

## 2018-12-03 MED ORDER — ATROPINE SULFATE 1 % OP SOLN
OPHTHALMIC | Status: DC | PRN
  Administered 2018-12-03: 1 [drp] via OPHTHALMIC

## 2018-12-03 MED ORDER — MIDAZOLAM HCL 2 MG/2ML IJ SOLN
INTRAMUSCULAR | Status: AC
  Filled 2018-12-03: qty 2

## 2018-12-03 MED ORDER — ONDANSETRON HCL 4 MG/2ML IJ SOLN: 4.0000 mg | Freq: Once | INTRAMUSCULAR | Status: DC | PRN

## 2018-12-03 MED ORDER — FENTANYL CITRATE (PF) 100 MCG/2ML IJ SOLN
INTRAMUSCULAR | Status: DC | PRN
  Administered 2018-12-03: 100 ug via INTRAVENOUS

## 2018-12-03 MED ORDER — PHENYLEPHRINE HCL 2.5 % OP SOLN: 1.0000 [drp] | OPHTHALMIC | Status: DC

## 2018-12-03 MED ORDER — PHENYLEPHRINE HCL 2.5 % OP SOLN
1.0000 [drp] | OPHTHALMIC | Status: AC
  Administered 2018-12-03 (×3): 1 [drp] via OPHTHALMIC
  Filled 2018-12-03: qty 2

## 2018-12-03 MED ORDER — PROPOFOL 10 MG/ML IV BOLUS
INTRAVENOUS | Status: DC | PRN
  Administered 2018-12-03: 180 mg via INTRAVENOUS

## 2018-12-03 MED ORDER — RANIBIZUMAB 0.5 MG/0.05ML IZ SOLN
INTRAVITREAL | Status: DC | PRN
  Administered 2018-12-03: .3 mg via INTRAVITREAL

## 2018-12-03 MED ORDER — ATROPINE SULFATE 1 % OP SOLN: 1.0000 [drp] | OPHTHALMIC | Status: DC

## 2018-12-03 MED ORDER — ROCURONIUM BROMIDE 50 MG/5ML IV SOSY
PREFILLED_SYRINGE | INTRAVENOUS | Status: AC
  Filled 2018-12-03: qty 5

## 2018-12-03 MED ORDER — STERILE WATER FOR INJECTION IJ SOLN
INTRAMUSCULAR | Status: AC
  Filled 2018-12-03: qty 10

## 2018-12-03 MED ORDER — BSS PLUS IO SOLN
INTRAOCULAR | Status: AC
  Filled 2018-12-03: qty 500

## 2018-12-03 MED ORDER — FENTANYL CITRATE (PF) 250 MCG/5ML IJ SOLN
INTRAMUSCULAR | Status: AC
  Filled 2018-12-03: qty 5

## 2018-12-03 MED ORDER — DEXAMETHASONE SODIUM PHOSPHATE 10 MG/ML IJ SOLN
INTRAMUSCULAR | Status: DC | PRN
  Administered 2018-12-03: 10 mg

## 2018-12-03 MED ORDER — HYALURONIDASE HUMAN 150 UNIT/ML IJ SOLN
INTRAMUSCULAR | Status: AC
  Filled 2018-12-03: qty 1

## 2018-12-03 MED ORDER — ATROPINE SULFATE 1 % OP SOLN
1.0000 [drp] | OPHTHALMIC | Status: AC
  Administered 2018-12-03 (×3): 1 [drp] via OPHTHALMIC
  Filled 2018-12-03: qty 2

## 2018-12-03 MED ORDER — OXYCODONE HCL 5 MG PO TABS: 5.0000 mg | ORAL_TABLET | Freq: Once | ORAL | Status: DC | PRN

## 2018-12-03 MED ORDER — DEXAMETHASONE SODIUM PHOSPHATE 10 MG/ML IJ SOLN
INTRAMUSCULAR | Status: AC
  Filled 2018-12-03: qty 1

## 2018-12-03 MED ORDER — SODIUM CHLORIDE 0.9 % IV SOLN
INTRAVENOUS | Status: DC
  Administered 2018-12-03 (×2): via INTRAVENOUS

## 2018-12-03 MED ORDER — ONDANSETRON HCL 4 MG/2ML IJ SOLN
INTRAMUSCULAR | Status: DC | PRN
  Administered 2018-12-03: 4 mg via INTRAVENOUS

## 2018-12-03 MED ORDER — SODIUM HYALURONATE 10 MG/ML IO SOLN
INTRAOCULAR | Status: AC
  Filled 2018-12-03: qty 0.85

## 2018-12-03 MED ORDER — BSS IO SOLN
INTRAOCULAR | Status: AC
  Filled 2018-12-03: qty 15

## 2018-12-03 MED ORDER — TOBRAMYCIN-DEXAMETHASONE 0.3-0.1 % OP OINT
TOPICAL_OINTMENT | OPHTHALMIC | Status: AC
  Filled 2018-12-03: qty 3.5

## 2018-12-03 MED ORDER — LIDOCAINE HCL (CARDIAC) PF 100 MG/5ML IV SOSY
PREFILLED_SYRINGE | INTRAVENOUS | Status: DC | PRN
  Administered 2018-12-03: 60 mg via INTRAVENOUS

## 2018-12-03 MED ORDER — ATROPINE SULFATE 1 % OP SOLN
OPHTHALMIC | Status: AC
  Filled 2018-12-03: qty 5

## 2018-12-03 MED ORDER — MIDAZOLAM HCL 5 MG/5ML IJ SOLN
INTRAMUSCULAR | Status: DC | PRN
  Administered 2018-12-03: 2 mg via INTRAVENOUS

## 2018-12-03 MED ORDER — LIDOCAINE HCL 2 % IJ SOLN
INTRAMUSCULAR | Status: DC | PRN
  Administered 2018-12-03: 5.5 mL via RETROBULBAR

## 2018-12-03 MED ORDER — INDOCYANINE GREEN 25 MG IV SOLR
INTRAVENOUS | Status: DC | PRN
  Administered 2018-12-03: 25 mg via OPHTHALMIC

## 2018-12-03 MED ORDER — LIDOCAINE HCL (PF) 2 % IJ SOLN
INTRAMUSCULAR | Status: AC
  Filled 2018-12-03: qty 20

## 2018-12-03 MED ORDER — EPHEDRINE SULFATE-NACL 50-0.9 MG/10ML-% IV SOSY
PREFILLED_SYRINGE | INTRAVENOUS | Status: DC | PRN
  Administered 2018-12-03 (×2): 10 mg via INTRAVENOUS

## 2018-12-03 MED ORDER — SUGAMMADEX SODIUM 200 MG/2ML IV SOLN
INTRAVENOUS | Status: DC | PRN
  Administered 2018-12-03: 200 mg via INTRAVENOUS

## 2018-12-03 MED ORDER — PROVISC 10 MG/ML IO SOLN
INTRAOCULAR | Status: DC | PRN
  Administered 2018-12-03: .85 mL via INTRAOCULAR

## 2018-12-03 MED ORDER — INDOCYANINE GREEN 25 MG IV SOLR
INTRAVENOUS | Status: AC
  Filled 2018-12-03: qty 25

## 2018-12-03 MED ORDER — TOBRAMYCIN-DEXAMETHASONE 0.3-0.1 % OP OINT
TOPICAL_OINTMENT | OPHTHALMIC | Status: DC | PRN
  Administered 2018-12-03: 1 via OPHTHALMIC

## 2018-12-03 MED ORDER — BUPIVACAINE HCL (PF) 0.75 % IJ SOLN
INTRAMUSCULAR | Status: AC
  Filled 2018-12-03: qty 10

## 2018-12-03 SURGICAL SUPPLY — 68 items
ACCESSORY FRAGMATOME (MISCELLANEOUS) IMPLANT
APPLICATOR DR MATTHEWS STRL (MISCELLANEOUS) IMPLANT
BLADE 10 SAFETY STRL DISP (BLADE) ×3 IMPLANT
BLADE MINI 60D BLUE (BLADE) IMPLANT
BLADE MVR KNIFE 20G (BLADE) IMPLANT
BNDG EYE OVAL (GAUZE/BANDAGES/DRESSINGS) ×3 IMPLANT
CANNULA ANT CHAM MAIN (OPHTHALMIC RELATED) IMPLANT
CANNULA ANTERIOR CHAMBER 27GA (MISCELLANEOUS) IMPLANT
CANNULA DUAL BORE 23G (CANNULA) IMPLANT
CANNULA DUALBORE 25G (CANNULA) IMPLANT
CANNULA VLV SOFT TIP 25GA (OPHTHALMIC) ×3 IMPLANT
CAUTERY EYE LOW TEMP 1300F FIN (OPHTHALMIC RELATED) IMPLANT
CLOSURE STERI-STRIP 1/2X4 (GAUZE/BANDAGES/DRESSINGS) ×1
CLSR STERI-STRIP ANTIMIC 1/2X4 (GAUZE/BANDAGES/DRESSINGS) ×2 IMPLANT
CORD BIPOLAR FORCEPS 12FT (ELECTRODE) ×3 IMPLANT
COVER MAYO STAND STRL (DRAPES) IMPLANT
COVER WAND RF STERILE (DRAPES) ×3 IMPLANT
DRAPE HALF SHEET 40X57 (DRAPES) ×3 IMPLANT
DRAPE INCISE 51X51 W/FILM STRL (DRAPES) IMPLANT
DRAPE RETRACTOR (MISCELLANEOUS) ×3 IMPLANT
ERASER HMR WETFIELD 23G BP (MISCELLANEOUS) IMPLANT
FILTER BLUE MILLIPORE (MISCELLANEOUS) IMPLANT
FORCEPS ECKARDT ILM 25G SERR (OPHTHALMIC RELATED) IMPLANT
FORCEPS GRIESHABER ILM 25G A (INSTRUMENTS) ×3 IMPLANT
FORCEPS ILM 25G DSP TIP (MISCELLANEOUS) IMPLANT
GAS AUTO FILL CONSTEL (OPHTHALMIC)
GAS AUTO FILL CONSTELLATION (OPHTHALMIC) IMPLANT
GLOVE BIO SURGEON STRL SZ7.5 (GLOVE) ×3 IMPLANT
GOWN STRL REUS W/ TWL LRG LVL3 (GOWN DISPOSABLE) ×2 IMPLANT
GOWN STRL REUS W/TWL LRG LVL3 (GOWN DISPOSABLE) ×4
HANDLE PNEUMATIC FOR CONSTEL (OPHTHALMIC) ×3 IMPLANT
KIT BASIN OR (CUSTOM PROCEDURE TRAY) ×3 IMPLANT
KIT PERFLUORON PROCEDURE 5ML (MISCELLANEOUS) IMPLANT
KIT TURNOVER KIT B (KITS) IMPLANT
LENS BIOM SUPER VIEW SET DISP (OPHTHALMIC RELATED) ×3 IMPLANT
MICROPICK 25G (MISCELLANEOUS)
NEEDLE 18GX1X1/2 (RX/OR ONLY) (NEEDLE) ×6 IMPLANT
NEEDLE 25GX 5/8IN NON SAFETY (NEEDLE) ×6 IMPLANT
NEEDLE FILTER BLUNT 18X 1/2SAF (NEEDLE) ×6
NEEDLE FILTER BLUNT 18X1 1/2 (NEEDLE) ×3 IMPLANT
NEEDLE HYPO 25GX1X1/2 BEV (NEEDLE) ×3 IMPLANT
NEEDLE HYPO 30X.5 LL (NEEDLE) ×3 IMPLANT
NEEDLE RETROBULBAR 25GX1.5 (NEEDLE) ×3 IMPLANT
NS IRRIG 1000ML POUR BTL (IV SOLUTION) ×3 IMPLANT
PACK FRAGMATOME (OPHTHALMIC) IMPLANT
PAD ARMBOARD 7.5X6 YLW CONV (MISCELLANEOUS) ×6 IMPLANT
PAK PIK VITRECTOMY CVS 25GA (OPHTHALMIC) ×2 IMPLANT
PAK VITRECTOMY PIK 25 GA (OPHTHALMIC RELATED) ×3 IMPLANT
PENCIL BIPOLAR 25GA STR DISP (OPHTHALMIC RELATED) ×3 IMPLANT
PICK MICROPICK 25G (MISCELLANEOUS) IMPLANT
PROBE LASER ILLUM FLEX CVD 25G (OPHTHALMIC) ×3 IMPLANT
RETRACTOR IRIS FLEX 25G GRIESH (INSTRUMENTS) IMPLANT
ROLLS DENTAL (MISCELLANEOUS) IMPLANT
SCRAPER DIAMOND 25GA (OPHTHALMIC RELATED) IMPLANT
SHIELD EYE LENSE ONLY DISP (GAUZE/BANDAGES/DRESSINGS) ×3 IMPLANT
STOCKINETTE IMPERVIOUS 9X36 MD (GAUZE/BANDAGES/DRESSINGS) ×6 IMPLANT
STOPCOCK 4 WAY LG BORE MALE ST (IV SETS) IMPLANT
SUT ETHILON 8 0 TG100 8 (SUTURE) IMPLANT
SUT VICRYL 7 0 TG140 8 (SUTURE) IMPLANT
SUT VICRYL 8 0 TG140 8 (SUTURE) IMPLANT
SUT VICRYL ABS 6-0 S29 18IN (SUTURE) IMPLANT
SYR 10ML LL (SYRINGE) IMPLANT
SYR 20CC LL (SYRINGE) ×3 IMPLANT
SYR 5ML LL (SYRINGE) ×3 IMPLANT
SYR TB 1ML LUER SLIP (SYRINGE) ×3 IMPLANT
TUBE CONNECTING 12'X1/4 (SUCTIONS)
TUBE CONNECTING 12X1/4 (SUCTIONS) IMPLANT
WATER STERILE IRR 1000ML POUR (IV SOLUTION) ×3 IMPLANT

## 2018-12-03 NOTE — Discharge Instructions (Signed)
Eye Enucleation, Care After This sheet gives you information about how to care for yourself after your procedure. Your health care provider may also give you more specific instructions. If you have problems or questions, contact your health care provider. What can I expect after the procedure? After the procedure, it is common to have:  Some pain and discomfort.  Some swelling, bruising, or both around your eye socket or on your eyelids.  Watery or pinkish drainage from your eye socket.  Feelings of pain, other sensations, or light or images that seem to come from the removed eyeball (phantom eye syndrome).  Problems seeing with the one remaining eye (monocular vision), such as limited side (peripheral) vision and limited ability to see distances between objects (depth perception). This should get better over time. Follow these instructions at home: Medicines  Take or apply over-the-counter and prescription medicines only as told by your health care provider. These include eye drops or ointments.  If you were prescribed an antibiotic medicine, take or apply it as told by your health care provider. Do not stop using the antibiotic even if your condition improves. Driving  Do not drive until your health care provider approves. Ask your health care provider when it is safe for you to drive. ? Do not drive for 24 hours if you were given a medicine to help you relax (sedative) during your procedure. ? Do not drive or use heavy machinery while taking prescription pain medicine. Activity  Return to your normal activities as told by your health care provider. Ask your health care provider what activities are safe for you.  Do not lift anything that is heavier than 10 lb (4.5 kg), or the limit that you are told, until your health care provider says that it is safe. Eye care   Do not press on or rub your eye socket.  Change your bandage (dressing) as told by your health care provider. Wash  your hands with soap and water before you change your dressing. If soap and water are not available, use hand sanitizer.  If your artificial eyeball (prosthesis) falls out, clean it with soap and water, wash your hands, and then put the prosthesis in place between your eyelids. If you cannot put your prosthesis back in, contact your health care provider.  Check your eye area every day for signs of infection. Check for: ? More redness, swelling, or pain. ? More fluid or blood. ? Warmth. ? Pus or a bad smell.  If directed, put a clean, cool, damp towel (compress) over your eye. ? If directed, put the towel in a plastic bag. ? Leave the compress on for 20 minutes, 2-3 times a day. General instructions  Do not take baths, swim, or use a hot tub until your health care provider approves. Ask your health care provider if you may take showers. You may only be allowed to take sponge baths.  Do not use any products that contain nicotine or tobacco, such as cigarettes and e-cigarettes. If you need help quitting, ask your health care provider.  If you have a job that requires good vision with a full area of sight, talk with your employer about job safety.  You may wear an eye patch to look more natural until you get your natural-looking prosthesis. However, you may heal faster without an eye patch.  Keep all follow-up visits as told by your health care provider. This is important. Contact a health care provider if:  You have pain  or a headache that does not get better with treatment.  Your prosthesis comes out and you cannot put it back in.  You have a fever.  You have more redness, swelling, or pain around your eye area.  You have more fluid or blood coming from your eye area.  Your eye area feels warm to the touch.  You have pus or a bad smell coming from your eye area.  You cannot eat or drink without vomiting.  You have trouble coping with the loss of your eye. Get help right away  if:  You have trouble breathing.  You are confused.  You have problems speaking.  You have severe chest pain.  You have a severe headache. These symptoms may represent a serious problem that is an emergency. Do not wait to see if the symptoms will go away. Get medical help right away. Call your local emergency services (911 in the [place] Do not drive yourself to the hospital. Summary  Take or apply over-the-counter and prescription medicines only as told by your health care provider. These include eye drops or ointments.  Do not press on or rub your eye socket.  Change your bandage (dressing) as told by your health care provider.  Do not drive until your health care provider approves. Ask your health care provider when it is safe for you to drive. This information is not intended to replace advice given to you by your health care provider. Make sure you discuss any questions you have with your health care provider. Document Released: 01/16/2017 Document Revised: 01/16/2017 Document Reviewed: 01/16/2017 Elsevier Interactive Patient Education  2019 [place].   Vitrectomy, Care After Refer to this sheet in the next few weeks. These instructions provide you with information about caring for yourself after your procedure. Your health care provider may also give you more specific instructions. Your treatment has been planned according to current medical practices, but problems sometimes occur. Call your health care provider if you have any problems or questions after your procedure. What can I expect after the procedure? After the procedure, it is common to have:  A dancing bubble blocking your field of vision. This goes away over time.  Very blurry vision in the affected eye.  A dilated pupil.  Light sensitivity.  Difficulty seeing things up close. Follow these instructions at home: [place] the area around your eye clean and dry.  Wear your eye patch or eye  shield as told by your health care provider, if this applies.  Wear sunglasses if your eyes are sensitive to light. Activity   You may be told to stay in a certain position for a period of time, such as sitting up or lying on your back. It is important that you do this as directed by your health care provider.  Avoid strenuous physical activity for as long as told by your health care provider. This includes bending over, lifting anything that is heavier than 5 lb (2.3 kg), and straining.  Ask your health care provider when you may have sex.  Do not drive a car or use contact lenses until your health care provider approves. General instructions  Take or apply any over-the-counter and prescription medicines only as told by your health care provider. This includes any eye drops.  Keep all follow-up visits as told by your health care provider. This is important. Contact a health care provider if:  Your eye becomes very red or painful.  You develop any  pus or discharge coming from your eye.  You have chills.  Your eyelid on either eye becomes swollen or stuck shut. Get help right away if:  You have a fever.  You notice a vision change in either eye.  You see more floaters or spots in front of your vision.  Part of your vision is covered by what looks like a black curtain that you cannot see through. This information is not intended to replace advice given to you by your health care provider. Make sure you discuss any questions you have with your health care provider. Document Released: 06/07/2011 Document Revised: 02/25/2016 Document Reviewed: 09/10/2015 Elsevier Interactive Patient Education  2019 [place] Enucleation Eye enucleation is surgery to permanently remove the eyeball (globe). The space that remains (socket) after the eyeball is removed will be filled with an artificial eyeball (implant). Muscle tissue will be sewn onto the implant to keep the implant in  place and to give it some movement. The natural eyeball may need to be removed because of:  Injury.  Diabetes.  Tumors.  Eye disease such as glaucoma.  Inflammation or infection of the inner eye (endophthalmitis).  A painful, blind eye.  Abnormal (deformed) eyeball shape. Removal of your eye may cause strong feelings and changes to self-confidence, self-esteem, and self-image. Talk with your health care provider about these feelings and any concerns you may have. Tell a health care provider about:  Any allergies you have.  All medicines you are taking, including vitamins, herbs, eye drops, creams, and over-the-counter medicines.  Any problems you or family members have had with anesthetic medicines.  Any blood disorders you have.  Any surgeries you have had.  Any medical conditions you have.  Whether you are pregnant or may be pregnant. What are the risks? Generally, this is a safe procedure. However, problems may occur, including:  Infection around the globe with fluid drainage from the lining of the eyelids (conjunctivitis).  Bleeding.  Allergic reactions to medicines.  The implant changing position (migration) or coming out (extrusion).  The socket being too small for the implant.  The lower eyelid stretching out so that it cannot support the implant.  Drooping of the upper eyelid. What happens before the procedure? Medicines  Ask your health care provider about: ? Changing or stopping your regular medicines. This is especially important if you are taking diabetes medicines or blood thinners. ? Taking medicines such as aspirin and ibuprofen. These medicines can thin your blood. Do not take these medicines unless your health care provider tells you to take them. ? Taking over-the-counter medicines, vitamins, herbs, and supplements.  You may be given antibiotic medicine to help prevent infection.  You may be given medicine to prevent nausea and  vomiting. Staying hydrated Follow instructions from your health care provider about hydration, which may include:  Up to 2 hours before the procedure - you may continue to drink clear liquids, such as water, clear fruit juice, black coffee, and plain tea. Eating and drinking restrictions Follow instructions from your health care provider about eating and drinking, which may include:  8 hours before the procedure - stop eating heavy meals or foods such as meat, fried foods, or fatty foods.  6 hours before the procedure - stop eating light meals or foods, such as toast or cereal.  6 hours before the procedure - stop drinking milk or drinks that contain milk.  2 hours before the procedure - stop drinking clear liquids. General  instructions  You may have an exam or testing.  You may have a blood or urine sample taken.  You may be asked to shower with a germ-killing soap.  Plan to have someone take you home from the hospital or clinic.  Plan to have a responsible adult care for you for at least 24 hours after you leave the hospital or clinic. This is important.  Losing a part of the body can be difficult to accept. Your health care provider should speak with you about your surgery and what to expect after your procedure. What happens during the procedure?  To lower your risk of infection: ? Your health care team will wash or sanitize their hands. ? Your skin will be washed with soap.  An IV will be inserted into one of your veins.  You will be given one or more of the following: ? A medicine to help you relax (sedative). ? A medicine to numb the area (local anesthetic). ? A medicine to make you fall asleep (general anesthetic).  A surgical instrument (speculum) will be placed around your eye to hold your eyelids open.  The tissue that connects the eyeball to the socket (tendons) and the inside lining of the eyelid (conjunctiva) will be cut and separated from the eyeball.  The  muscles around the eyeball (extraocular muscles) will be cut away from the surface of the eye.  Stitches (sutures) may be placed at the end of the cut muscles to keep them in place.  The optic nerve will be cut, and the eyeball will be removed.  Bleeding will be stopped using a hot electric tool (cauterization).  A ball-shaped implant will be placed in the eye socket. Remaining tissue will be sutured over the implant.  Your extraocular muscles will be sutured to the outside of the implant to keep it from migrating.  Antibiotic ointment medicine may be applied to the area.  A temporary artificial covering (conformer) will be placed over the implant to help the socket keep its shape. It will be removed later when a permanent conformer is ready to be placed.  A tight bandage that applies pressure to the area (pressure dressing) will be placed over the socket. The procedure may vary among health care providers and hospitals. What happens after the procedure?  Your blood pressure, heart rate, breathing rate, and blood oxygen level will be monitored until the medicines you were given have worn off.  You may be given medicine to help with nausea and vomiting.  Your pressure bandage will remain in place until your health care provider tells you it can be removed.  Do not drive until your health care provider approves. Summary  Enucleation surgery permanently removes the eyeball (globe).  Removal of your eye may cause strong feelings and changes in how you think or feel about yourself.  Losing a part of the body can be hard to accept. Your health care provider should speak with you about your surgery and what to expect after your procedure. This information is not intended to replace advice given to you by your health care provider. Make sure you discuss any questions you have with your health care provider. Document Released: 01/13/2017 Document Revised: 01/13/2017 Document Reviewed:  01/13/2017 Elsevier Interactive Patient Education  2019 [place].   Vitrectomy  Vitrectomy is a procedure to remove vitreous from the eye and replace it with a saltwater solution (saline). Vitreous is a sticky, gel-like substance that fills most of the inside of  the eyeball. The vitreous is clear, and it needs to stay clear for you to see properly. This procedure may be done to stabilize or improve your vision. You may need this procedure if you have:  Cloudy vitreous.  Vitreous that pulls on the structures it touches and risks tearing them.  A severe eye injury.  Scar tissue on the light-sensitive layer of tissue in the back of your eye (retina).  Retinal bleeding.  A retina that is wrinkled (macular pucker) or detached.  An infection inside your eye. Tell a health care provider about:  Any allergies you have.  All medicines you are taking, including vitamins, herbs, eye drops, creams, and over-the-counter medicines.  Any problems you or family members have had with anesthetic medicines.  Any blood disorders you have.  Any surgeries you have had, including refractive surgery such as LASIK.  Any medical conditions you have.  Whether you are pregnant or may be pregnant. What are the risks? Generally, this is a safe procedure. However, problems may occur, including:  Infection.  Bleeding.  Allergic reactions to medicines or dyes.  Damage to other structures or organs.  Seeing small, drifting specks in your field of vision (floaters).  Retinal tears or detachment.  Increased eye pressure.  Clouding of the lens in the eye (cataracts).  Swelling of the front layer of the eye (cornea). What happens before the procedure?  You may have a physical exam. This may include an ultrasound to check the inside of your eye.  Follow instructions from your health care provider about eating or drinking restrictions.  Ask your health care provider about: ? Changing or  stopping your regular medicines. This is especially important if you are taking diabetes medicines or blood thinners. ? Taking medicines such as aspirin and ibuprofen. These medicines can thin your blood. Do not take these medicines before your procedure if your health care provider instructs you not to.  You may be given eye drops. Use them as told by your health care provider.  If you have bleeding in your eye or if you are at risk for retinal detachment, you may be told to limit your activity or stay in a certain position, such as sitting up. Do this as told by your health care provider.  Plan to have someone take you home after the procedure. What happens during the procedure?  To reduce your risk of infection: ? Your health care team will wash or sanitize their hands. ? Your skin will be washed with soap.  An IV tube will be inserted into one of your veins.  You will be given one or more of the following: ? A medicine to help you relax (sedative). ? A medicine to numb the area (local anesthetic). ? A medicine to make you fall asleep (general anesthetic).  Your eye will be examined through a microscope.  Small incisions will be made in the white part of your eye (sclera).  Small tools will be inserted through the incisions, and vitreous and any scar tissue will be removed.  Silicone oil or a bubble of air or gas may be used to reduce bleeding and hold your retina in place. The procedure may vary among health care providers and hospitals. What happens after the procedure?  Your blood pressure, heart rate, breathing rate, and blood oxygen level will be monitored often until the medicines you were given have worn off.  You will be given eye drops. You may also be given pain medicine.  You may need to stay in a certain position for a period of time, instead of being able to walk around. Your health care provider may ask you to sit up or remain on your back or your stomach.  You  may be given an eye patch or eye shield to wear.  Do not drive for 24 hours if you received a sedative. This information is not intended to replace advice given to you by your health care provider. Make sure you discuss any questions you have with your health care provider. Document Released: 06/01/2011 Document Revised: 02/25/2016 Document Reviewed: 05/27/2015 Elsevier Interactive Patient Education  2019 [employer].

## 2018-12-03 NOTE — Brief Op Note (Signed)
12/03/2018  11:51 AM  PATIENT:  Alexis Landry  48 y.o. female  PRE-OPERATIVE DIAGNOSIS:  PROLIFERATIVE DIABETIC RETINOPATHY W/ DIABETIC MACULAR EDEMA, EPIRETINAL MEMBRANE RIGHT EYE  POST-OPERATIVE DIAGNOSIS:  PROLIFERATIVE DIABETIC RETINOPATHY W/ DIABETIC MACULAR EDEMA, EPIRETINAL MEMBRANE RIGHT EYE  PROCEDURE:  Procedure(s): PARS PLANA VITRECTOMY WITH 25 GAUGE (Right) MEMBRANE PEEL (Right) PHOTOCOAGULATION WITH LASER (Right) LUCENTIS 0.3MG  INJECTION (Right)  SURGEON:  Surgeon(s) and Role:    Sherlynn Stalls, MD - Primary  PHYSICIAN ASSISTANT:   ASSISTANTS: none   ANESTHESIA:   general  EBL:  2 mL   BLOOD ADMINISTERED:none  DRAINS: none   LOCAL MEDICATIONS USED:  BUPIVICAINE   SPECIMEN:  No Specimen  DISPOSITION OF SPECIMEN:  N/A  COUNTS:  YES  TOURNIQUET:  * No tourniquets in log *  DICTATION: .Other Dictation: Dictation Number [id number]  PLAN OF CARE: Discharge to home after PACU  PATIENT DISPOSITION:  PACU - hemodynamically stable.   Delay start of Pharmacological VTE agent (>24hrs) due to surgical blood loss or risk of bleeding: not applicable

## 2018-12-03 NOTE — H&P (Signed)
Alexis Landry is an 48 y.o. female.   Chief Complaint: blurry vision OD HPI: poorly controled NIDDM with advanced PDR/VH OD  Past Medical History:  Diagnosis Date  . Diabetic neuropathy ([place])   . DM (diabetes mellitus), type 2, uncontrolled ([place]) 02/24/2015  . Epiretinal membrane, right   . GERD (gastroesophageal reflux disease) 02/24/2015  . HTN (hypertension) 02/24/2015  . Hyperlipidemia 02/24/2015  . Macular edema, diabetic ([place])   . Rheumatoid arthritis ([place]) 02/24/2015  . Vitamin D deficiency disease 02/24/2015    Past Surgical History:  Procedure Laterality Date  . COLONOSCOPY N/A 01/04/2017   Procedure: COLONOSCOPY;  Surgeon: Danie Binder, MD;  Location: [facility];  Service: Endoscopy;  Laterality: N/A;  10:30am  . TUBAL LIGATION      Family History  Problem Relation Age of Onset  . Heart disease Mother   . Kidney disease Father   . Diabetes Maternal Grandfather   . Diabetes Paternal Grandmother   . Diabetes Paternal Grandfather   . Colon cancer Neg Hx    Social History:  reports that she has never smoked. She has never used smokeless tobacco. She reports current alcohol use. She reports that she does not use drugs.  Allergies:  Allergies  Allergen Reactions  . Lisinopril Anaphylaxis    Throat swelling   . Sulfa Antibiotics Itching    Medications Prior to Admission  Medication Sig Dispense Refill  . amoxicillin (AMOXIL) 500 MG capsule Take 500 mg by mouth 2 (two) times daily.   0  . cloNIDine HCl (KAPVAY) 0.1 MG TB12 ER tablet Take 0.1 mg by mouth at bedtime.    Marland Kitchen JANUVIA 100 MG tablet Take 100 mg by mouth daily.  3  . losartan-hydrochlorothiazide (HYZAAR) 100-25 MG tablet Take 1 tablet by mouth daily.    . meloxicam (MOBIC) 15 MG tablet Take 15 mg by mouth daily as needed for pain.    . metFORMIN (GLUCOPHAGE-XR) 500 MG 24 hr tablet Take 500 mg by mouth 2 (two) times daily.    . naproxen sodium (ALEVE) 220 MG tablet Take 440 mg by mouth daily as needed  (pain).    Tyler Aas FLEXTOUCH 100 UNIT/ML SOPN FlexTouch Pen Inject 12 Units into the skin at bedtime.   0  . atorvastatin (LIPITOR) 10 MG tablet Take 10 mg by mouth every evening.       Results for orders placed or performed during the hospital encounter of 12/03/18 (from the past 48 hour(s))  Glucose, capillary     Status: Abnormal   Collection Time: 12/03/18  7:43 AM  Result Value Ref Range   Glucose-Capillary 178 (H) 70 - 99 mg/dL  Basic metabolic panel     Status: Abnormal   Collection Time: 12/03/18  7:59 AM  Result Value Ref Range   Sodium 141 135 - 145 mmol/L   Potassium 3.4 (L) 3.5 - 5.1 mmol/L   Chloride 106 98 - 111 mmol/L   CO2 25 22 - 32 mmol/L   Glucose, Bld 195 (H) 70 - 99 mg/dL   BUN 23 (H) 6 - 20 mg/dL   Creatinine, Ser 1.16 (H) 0.44 - 1.00 mg/dL   Calcium 8.9 8.9 - 10.3 mg/dL   GFR calc non Af Amer 55 (L) >60 mL/min   GFR calc Af Amer >60 >60 mL/min   Anion gap 10 5 - 15    Comment: Performed at [facility], [address], [place], [place] [address]   No results  found.  Review of Systems  Constitutional: Negative.   Eyes: Positive for blurred vision.  All other systems reviewed and are negative.   Blood pressure (!) 178/87, pulse 78, temperature 98.6 F (37 C), temperature source Oral, resp. rate 18, height 5\' 6"  (1.676 m), weight 95.7 kg, last menstrual period 09/19/2016, SpO2 99 %. Physical Exam  Constitutional: She appears well-developed and well-nourished.  Eyes: Conjunctivae, EOM and lids are normal.  Fundoscopic exam:      The right eye shows hemorrhage.       The left eye shows hemorrhage.     Assessment/Plan 1. PDR/DME/VH OD: PPV/EC/EL/MP/0.3mg  lucentis OD  Corliss Parish, MD 12/03/2018, 8:43 AM

## 2018-12-03 NOTE — Transfer of Care (Signed)
Immediate Anesthesia Transfer of Care Note  Patient: Emalyn A Achor  Procedure(s) Performed: PARS PLANA VITRECTOMY WITH 25 GAUGE (Right Eye) MEMBRANE PEEL (Right Eye) PHOTOCOAGULATION WITH LASER (Right Eye) LUCENTIS 0.3MG  INJECTION (Right Eye)  Patient Location: PACU  Anesthesia Type:General  Level of Consciousness: awake, alert  and patient cooperative  Airway & Oxygen Therapy: Patient Spontanous Breathing and Patient connected to nasal cannula oxygen  Post-op Assessment: Report given to RN and Post -op Vital signs reviewed and stable  Post vital signs: Reviewed  Last Vitals:  Vitals Value Taken Time  BP 152/75 12/03/2018 12:00 PM  Temp 36.2 C 12/03/2018 12:00 PM  Pulse 85 12/03/2018 12:02 PM  Resp 22 12/03/2018 12:02 PM  SpO2 99 % 12/03/2018 12:02 PM  Vitals shown include unvalidated device data.  Last Pain:  Vitals:   12/03/18 0811  TempSrc:   PainSc: 0-No pain      Patients Stated Pain Goal: 3 (70/01/74 9449)  Complications: No apparent anesthesia complications

## 2018-12-03 NOTE — Anesthesia Procedure Notes (Signed)
Procedure Name: Intubation Date/Time: 12/03/2018 10:33 AM Performed by: Jenne Campus, CRNA Pre-anesthesia Checklist: Patient identified, Emergency Drugs available, Suction available and Patient being monitored Patient Re-evaluated:Patient Re-evaluated prior to induction Oxygen Delivery Method: Circle System Utilized Preoxygenation: Pre-oxygenation with 100% oxygen Induction Type: IV induction Ventilation: Mask ventilation without difficulty Laryngoscope Size: Miller and 2 Grade View: Grade I Tube type: Oral Tube size: 7.0 mm Number of attempts: 1 Airway Equipment and Method: Stylet and Oral airway Placement Confirmation: ETT inserted through vocal cords under direct vision,  positive ETCO2 and breath sounds checked- equal and bilateral Secured at: 21 cm Tube secured with: Tape Dental Injury: Teeth and Oropharynx as per pre-operative assessment

## 2018-12-03 NOTE — Op Note (Signed)
NAME: Alexis Landry, Alexis Landry MEDICAL RECORD [id number] ACCOUNT [id number] DATE OF BIRTH:[birth year] FACILITY: [facility] LOCATION: [facility] PHYSICIAN:Eleuterio Dollar Greg Cutter, MD  OPERATIVE REPORT  DATE OF PROCEDURE:  12/03/2018  SURGEON:  Sherlynn Stalls, MD  ANESTHESIA:  General.  PREOPERATIVE DIAGNOSIS:  Proliferative diabetic retinopathy with traction detachment and vitreous hemorrhage in the right eye.  POSTOPERATIVE DIAGNOSIS:  Proliferative diabetic retinopathy with traction detachment and vitreous hemorrhage in the right eye.  OPERATIVE PROCEDURE:   1.  Pars plana vitrectomy with membrane peeling of complex traction and intravitreal cautery and panretinal laser photocoagulation of the right eye.   2.  Intravitreal injection of 0.3 mg Lucentis into the right eye.  COMPLICATIONS:  None.  FINDINGS:  There was advanced posterior traction with attached hyaloid and advanced proliferative diabetic retinopathy in the right eye.  DESCRIPTION OF PROCEDURE:  The patient identified preoperatively in the holding area and taken to the operating room where she was sedated and placed under general anesthesia.  At that point in time, the right eye was anesthetized using a retrobulbar  block consisting of 1:1 mixture of 0.75% bupivacaine and 1% lidocaine with 150 units of Hylenex.  After excellent akinesia and anesthesia was obtained, the right eye was prepped and draped in the usual sterile fashion for ocular surgery.  A Lieberman  speculum was placed between the right upper and lower eyelids.  A 25 gauge trocars were used to introduce transconjunctival cannulas in the inferior temporal, superior temporal and superior nasal quadrants.  Trocars were removed leaving the cannulas in  place.  The eye then underwent a core vitrectomy with the vitreous cutter and light pipe.  The posterior hyaloid was found to be firmly adherent along the posterior pole.  This was delicately dissected using contact lens for  magnification on the cornea.   Intraocular forceps were used in conjunction with the vitreous cutter to free up and dissect off the posterior hyaloid.  A minimal amount of ICG was also used to help identify preretinal membranes.  These membranes were engaged and peeled.  Fortunately,  no complications were encountered, despite dealing with an incredibly fragile retina.  Hemostasis was obtained with bipolar cautery.  The eye was then inspected with scleral depression.  No retinal breaks were seen.  The eye was treated with panretinal  photocoagulation using endolaser photocoagulation probe.  The probe was removed and the eye was inspected again with scleral depression.  Again, no retinal breaks were identified.  Therefore, the eye was treated with a 0.3 mg of Lucentis by injecting  Lucentis through the existing cannula using a 25 gauge short needle.  The needle was removed.  After this, the cannula was removed from the  sclerotomies were inspected and found to be watertight.  The eye was then treated with subconjunctival injections  of 50 mg Ancef 1 mg of dexamethasone.  The eye was treated topically with 1 drop of 1% atropine and TobraDex ointment.  The speculum was removed as were cleaned and closed and patch and shield.    The patient was then taken to recovery in excellent condition, having tolerated the procedure well.  AN/NUANCE  D:12/03/2018 T:12/03/2018 JOB:[id number]

## 2018-12-03 NOTE — Anesthesia Postprocedure Evaluation (Signed)
Anesthesia Post Note  Patient: Alexis Landry  Procedure(s) Performed: PARS PLANA VITRECTOMY WITH 25 GAUGE (Right Eye) MEMBRANE PEEL (Right Eye) PHOTOCOAGULATION WITH LASER (Right Eye) LUCENTIS 0.3MG  INJECTION (Right Eye)     Patient location during evaluation: PACU Anesthesia Type: General Level of consciousness: awake and alert Pain management: pain level controlled Vital Signs Assessment: post-procedure vital signs reviewed and stable Respiratory status: spontaneous breathing, nonlabored ventilation, respiratory function stable and patient connected to nasal cannula oxygen Cardiovascular status: blood pressure returned to baseline and stable Postop Assessment: no apparent nausea or vomiting Anesthetic complications: no    Last Vitals:  Vitals:   12/03/18 1245 12/03/18 1300  BP: (!) 151/78 (!) 147/73  Pulse: 73 74  Resp: 14 13  Temp:  36.7 C  SpO2: 100% 99%    Last Pain:  Vitals:   12/03/18 1306  TempSrc:   PainSc: 0-No pain                 Chelbie Jarnagin COKER

## 2018-12-04 ENCOUNTER — Encounter (HOSPITAL_COMMUNITY): Payer: Self-pay | Admitting: Ophthalmology

## 2018-12-04 DIAGNOSIS — E113511 Type 2 diabetes mellitus with proliferative diabetic retinopathy with macular edema, right eye: Secondary | ICD-10-CM | POA: Diagnosis not present

## 2018-12-11 DIAGNOSIS — H43822 Vitreomacular adhesion, left eye: Secondary | ICD-10-CM | POA: Diagnosis not present

## 2018-12-11 DIAGNOSIS — E113511 Type 2 diabetes mellitus with proliferative diabetic retinopathy with macular edema, right eye: Secondary | ICD-10-CM | POA: Diagnosis not present

## 2019-01-18 DIAGNOSIS — H43822 Vitreomacular adhesion, left eye: Secondary | ICD-10-CM | POA: Diagnosis not present

## 2019-01-18 DIAGNOSIS — E113521 Type 2 diabetes mellitus with proliferative diabetic retinopathy with traction retinal detachment involving the macula, right eye: Secondary | ICD-10-CM | POA: Diagnosis not present

## 2019-01-18 DIAGNOSIS — E113592 Type 2 diabetes mellitus with proliferative diabetic retinopathy without macular edema, left eye: Secondary | ICD-10-CM | POA: Diagnosis not present

## 2019-02-15 DIAGNOSIS — E113513 Type 2 diabetes mellitus with proliferative diabetic retinopathy with macular edema, bilateral: Secondary | ICD-10-CM | POA: Diagnosis not present

## 2019-02-15 DIAGNOSIS — H2513 Age-related nuclear cataract, bilateral: Secondary | ICD-10-CM | POA: Diagnosis not present

## 2019-02-15 DIAGNOSIS — H3582 Retinal ischemia: Secondary | ICD-10-CM | POA: Diagnosis not present

## 2019-02-15 DIAGNOSIS — H43822 Vitreomacular adhesion, left eye: Secondary | ICD-10-CM | POA: Diagnosis not present

## 2019-02-21 DIAGNOSIS — E1165 Type 2 diabetes mellitus with hyperglycemia: Secondary | ICD-10-CM | POA: Diagnosis not present

## 2019-02-21 DIAGNOSIS — I10 Essential (primary) hypertension: Secondary | ICD-10-CM | POA: Diagnosis not present

## 2019-02-21 DIAGNOSIS — Z7984 Long term (current) use of oral hypoglycemic drugs: Secondary | ICD-10-CM | POA: Diagnosis not present

## 2019-02-21 DIAGNOSIS — B488 Other specified mycoses: Secondary | ICD-10-CM | POA: Diagnosis not present

## 2019-03-15 DIAGNOSIS — E113513 Type 2 diabetes mellitus with proliferative diabetic retinopathy with macular edema, bilateral: Secondary | ICD-10-CM | POA: Diagnosis not present

## 2019-04-04 DIAGNOSIS — E785 Hyperlipidemia, unspecified: Secondary | ICD-10-CM | POA: Diagnosis not present

## 2019-04-04 DIAGNOSIS — E119 Type 2 diabetes mellitus without complications: Secondary | ICD-10-CM | POA: Diagnosis not present

## 2019-04-04 DIAGNOSIS — I10 Essential (primary) hypertension: Secondary | ICD-10-CM | POA: Diagnosis not present

## 2019-04-04 DIAGNOSIS — E1165 Type 2 diabetes mellitus with hyperglycemia: Secondary | ICD-10-CM | POA: Diagnosis not present

## 2019-04-12 DIAGNOSIS — H3582 Retinal ischemia: Secondary | ICD-10-CM | POA: Diagnosis not present

## 2019-04-12 DIAGNOSIS — H2513 Age-related nuclear cataract, bilateral: Secondary | ICD-10-CM | POA: Diagnosis not present

## 2019-04-12 DIAGNOSIS — E113513 Type 2 diabetes mellitus with proliferative diabetic retinopathy with macular edema, bilateral: Secondary | ICD-10-CM | POA: Diagnosis not present

## 2019-05-10 DIAGNOSIS — E785 Hyperlipidemia, unspecified: Secondary | ICD-10-CM | POA: Diagnosis not present

## 2019-05-10 DIAGNOSIS — I10 Essential (primary) hypertension: Secondary | ICD-10-CM | POA: Diagnosis not present

## 2019-05-10 DIAGNOSIS — E119 Type 2 diabetes mellitus without complications: Secondary | ICD-10-CM | POA: Diagnosis not present

## 2019-05-16 DIAGNOSIS — E785 Hyperlipidemia, unspecified: Secondary | ICD-10-CM | POA: Diagnosis not present

## 2019-05-16 DIAGNOSIS — E1165 Type 2 diabetes mellitus with hyperglycemia: Secondary | ICD-10-CM | POA: Diagnosis not present

## 2019-05-16 DIAGNOSIS — I10 Essential (primary) hypertension: Secondary | ICD-10-CM | POA: Diagnosis not present

## 2019-05-16 DIAGNOSIS — Z9119 Patient's noncompliance with other medical treatment and regimen: Secondary | ICD-10-CM | POA: Diagnosis not present

## 2019-07-08 DIAGNOSIS — E113512 Type 2 diabetes mellitus with proliferative diabetic retinopathy with macular edema, left eye: Secondary | ICD-10-CM | POA: Diagnosis not present

## 2019-07-08 DIAGNOSIS — H35372 Puckering of macula, left eye: Secondary | ICD-10-CM | POA: Diagnosis not present

## 2019-07-08 DIAGNOSIS — H4313 Vitreous hemorrhage, bilateral: Secondary | ICD-10-CM | POA: Diagnosis not present

## 2019-07-08 DIAGNOSIS — E113591 Type 2 diabetes mellitus with proliferative diabetic retinopathy without macular edema, right eye: Secondary | ICD-10-CM | POA: Diagnosis not present

## 2019-07-12 DIAGNOSIS — H3581 Retinal edema: Secondary | ICD-10-CM | POA: Diagnosis not present

## 2019-07-12 DIAGNOSIS — H4313 Vitreous hemorrhage, bilateral: Secondary | ICD-10-CM | POA: Diagnosis not present

## 2019-07-12 DIAGNOSIS — E113591 Type 2 diabetes mellitus with proliferative diabetic retinopathy without macular edema, right eye: Secondary | ICD-10-CM | POA: Diagnosis not present

## 2019-07-12 DIAGNOSIS — E113512 Type 2 diabetes mellitus with proliferative diabetic retinopathy with macular edema, left eye: Secondary | ICD-10-CM | POA: Diagnosis not present

## 2019-07-15 DIAGNOSIS — I10 Essential (primary) hypertension: Secondary | ICD-10-CM | POA: Diagnosis not present

## 2019-07-15 DIAGNOSIS — E1165 Type 2 diabetes mellitus with hyperglycemia: Secondary | ICD-10-CM | POA: Diagnosis not present

## 2019-07-15 DIAGNOSIS — E785 Hyperlipidemia, unspecified: Secondary | ICD-10-CM | POA: Diagnosis not present

## 2019-07-15 DIAGNOSIS — B373 Candidiasis of vulva and vagina: Secondary | ICD-10-CM | POA: Diagnosis not present

## 2019-07-25 ENCOUNTER — Emergency Department (HOSPITAL_COMMUNITY)
Admission: EM | Admit: 2019-07-25 | Discharge: 2019-07-25 | Disposition: A | Discharge status: Home / Self Care | Payer: BC Managed Care – PPO | Attending: Emergency Medicine | Admitting: Emergency Medicine

## 2019-07-25 ENCOUNTER — Emergency Department (HOSPITAL_COMMUNITY): Payer: BC Managed Care – PPO

## 2019-07-25 ENCOUNTER — Other Ambulatory Visit: Payer: Self-pay

## 2019-07-25 ENCOUNTER — Encounter (HOSPITAL_COMMUNITY): Payer: Self-pay

## 2019-07-25 DIAGNOSIS — S335XXA Sprain of ligaments of lumbar spine, initial encounter: Secondary | ICD-10-CM | POA: Insufficient documentation

## 2019-07-25 DIAGNOSIS — Y939 Activity, unspecified: Secondary | ICD-10-CM | POA: Insufficient documentation

## 2019-07-25 DIAGNOSIS — W19XXXA Unspecified fall, initial encounter: Secondary | ICD-10-CM

## 2019-07-25 DIAGNOSIS — S8392XA Sprain of unspecified site of left knee, initial encounter: Secondary | ICD-10-CM | POA: Diagnosis not present

## 2019-07-25 DIAGNOSIS — W010XXA Fall on same level from slipping, tripping and stumbling without subsequent striking against object, initial encounter: Secondary | ICD-10-CM | POA: Diagnosis not present

## 2019-07-25 DIAGNOSIS — E119 Type 2 diabetes mellitus without complications: Secondary | ICD-10-CM | POA: Diagnosis not present

## 2019-07-25 DIAGNOSIS — S8992XA Unspecified injury of left lower leg, initial encounter: Secondary | ICD-10-CM | POA: Diagnosis not present

## 2019-07-25 DIAGNOSIS — Z7984 Long term (current) use of oral hypoglycemic drugs: Secondary | ICD-10-CM | POA: Diagnosis not present

## 2019-07-25 DIAGNOSIS — Z79899 Other long term (current) drug therapy: Secondary | ICD-10-CM | POA: Diagnosis not present

## 2019-07-25 DIAGNOSIS — S3992XA Unspecified injury of lower back, initial encounter: Secondary | ICD-10-CM | POA: Diagnosis not present

## 2019-07-25 DIAGNOSIS — Y999 Unspecified external cause status: Secondary | ICD-10-CM | POA: Insufficient documentation

## 2019-07-25 DIAGNOSIS — Y92002 Bathroom of unspecified non-institutional (private) residence single-family (private) house as the place of occurrence of the external cause: Secondary | ICD-10-CM | POA: Insufficient documentation

## 2019-07-25 DIAGNOSIS — I10 Essential (primary) hypertension: Secondary | ICD-10-CM | POA: Diagnosis not present

## 2019-07-25 MED ORDER — CYCLOBENZAPRINE HCL 10 MG PO TABS: 10.0000 mg | ORAL_TABLET | Freq: Two times a day (BID) | ORAL | 0 refills | Status: AC | PRN

## 2019-07-25 MED ORDER — NAPROXEN 500 MG PO TABS: 500.0000 mg | ORAL_TABLET | Freq: Two times a day (BID) | ORAL | 0 refills | Status: DC

## 2019-07-25 NOTE — ED Provider Notes (Signed)
[facility] EMERGENCY DEPARTMENT Provider Note   CSN: [id number] Arrival date & time: 07/25/19  [id number]     History   Chief Complaint Chief Complaint  Patient presents with   Fall    HPI Alexis Landry is a 48 y.o. female.     Patient is a 48 year old female with past medical history of diabetic neuropathy, diabetes, GERD, hypertension, rheumatoid arthritis presenting to the emergency department for left knee and right-sided back pain after mechanical fall yesterday.  Patient reports that she slipped on a wet floor in the bathroom yesterday.  Reports that she landed on her left knee and then fell backwards onto her back.  Did not hit her head or pass out.  Able to ambulate but reports persistent anterior left knee pain and right-sided back pain.  Back pain does not radiate.  Denies any numbness, tingling, weakness, saddle anesthesia, fever, loss of control of bowel or bladder movements.     Past Medical History:  Diagnosis Date   Diabetic neuropathy ([place])    DM (diabetes mellitus), type 2, uncontrolled ([place]) 02/24/2015   Epiretinal membrane, right    GERD (gastroesophageal reflux disease) 02/24/2015   HTN (hypertension) 02/24/2015   Hyperlipidemia 02/24/2015   Macular edema, diabetic ([place])    Rheumatoid arthritis ([place]) 02/24/2015   Vitamin D deficiency disease 02/24/2015    Patient Active Problem List   Diagnosis Date Noted   Encounter for screening colonoscopy 12/19/2016   Constipation 12/19/2016   Vitamin D deficiency disease 02/24/2015   HTN (hypertension) 02/24/2015   Hyperlipidemia 02/24/2015   GERD (gastroesophageal reflux disease) 02/24/2015   DM (diabetes mellitus), type 2, uncontrolled ([place]) 02/24/2015   Rheumatoid arthritis ([place]) 02/24/2015    Past Surgical History:  Procedure Laterality Date   COLONOSCOPY N/A 01/04/2017   Procedure: COLONOSCOPY;  Surgeon: Danie Binder, MD;  [facility];  Service: Endoscopy;  Laterality: N/A;   10:30am   KENALOG INJECTION Right 12/03/2018   Procedure: LUCENTIS 0.3MG  INJECTION;  Surgeon: Sherlynn Stalls, MD;  Location: [place];  Service: Ophthalmology;  Laterality: Right;   MEMBRANE PEEL Right 12/03/2018   Procedure: MEMBRANE PEEL;  Surgeon: Sherlynn Stalls, MD;  Location: [place];  Service: Ophthalmology;  Laterality: Right;   PARS PLANA VITRECTOMY Right 12/03/2018   Procedure: PARS PLANA VITRECTOMY WITH 25 GAUGE;  Surgeon: Sherlynn Stalls, MD;  Location: [place];  Service: Ophthalmology;  Laterality: Right;   PHOTOCOAGULATION WITH LASER Right 12/03/2018   Procedure: PHOTOCOAGULATION WITH LASER;  Surgeon: Sherlynn Stalls, MD;  Location: [place];  Service: Ophthalmology;  Laterality: Right;   TUBAL LIGATION       OB History    Gravida  5   Para  4   Term      Preterm      AB  1   Living  4     SAB      TAB      Ectopic      Multiple      Live Births               Home Medications    Prior to Admission medications   Medication Sig Start Date End Date Taking? Authorizing Provider  amoxicillin (AMOXIL) 500 MG capsule Take 500 mg by mouth 2 (two) times daily.  05/17/18   [provider]  atorvastatin (LIPITOR) 10 MG tablet Take 10 mg by mouth every evening.     [provider]  cloNIDine HCl (KAPVAY) 0.1 MG TB12  ER tablet Take 0.1 mg by mouth at bedtime.    [provider]  cyclobenzaprine (FLEXERIL) 10 MG tablet Take 1 tablet (10 mg total) by mouth 2 (two) times daily as needed for up to 7 days for muscle spasms. 07/25/19 08/01/19  Madilyn Hook A, PA-C  JANUVIA 100 MG tablet Take 100 mg by mouth daily. 05/17/18   [provider]  losartan-hydrochlorothiazide (HYZAAR) 100-25 MG tablet Take 1 tablet by mouth daily.    [provider]  meloxicam (MOBIC) 15 MG tablet Take 15 mg by mouth daily as needed for pain.    [provider]  metFORMIN (GLUCOPHAGE-XR) 500 MG 24 hr tablet Take 500 mg by mouth 2 (two) times daily.     [provider]  naproxen (NAPROSYN) 500 MG tablet Take 1 tablet (500 mg total) by mouth 2 (two) times daily. 07/25/19   Madilyn Hook A, PA-C  naproxen sodium (ALEVE) 220 MG tablet Take 440 mg by mouth daily as needed (pain).    [provider]  TRESIBA FLEXTOUCH 100 UNIT/ML SOPN FlexTouch Pen Inject 12 Units into the skin at bedtime.  05/08/18   [provider]    Family History Family History  Problem Relation Age of Onset   Heart disease Mother    Kidney disease Father    Diabetes Maternal Grandfather    Diabetes Paternal Grandmother    Diabetes Paternal Grandfather    Colon cancer Neg Hx     Social History Social History   Tobacco Use   Smoking status: Never Smoker   Smokeless tobacco: Never Used  Substance Use Topics   Alcohol use: Yes    Alcohol/week: 0.0 standard drinks    Comment: two times per month   Drug use: No     Allergies   Lisinopril and Sulfa antibiotics   Review of Systems Review of Systems  Constitutional: Negative for appetite change, fatigue and fever.  Respiratory: Negative for cough and shortness of breath.   Cardiovascular: Negative for chest pain.  Musculoskeletal: Positive for arthralgias, back pain and joint swelling. Negative for gait problem, myalgias, neck pain and neck stiffness.  Skin: Negative for color change, rash and wound.  Neurological: Negative for dizziness, syncope, weakness, light-headedness and headaches.     Physical Exam Updated Vital Signs BP (!) 169/83 (BP Location: Right Arm)    Pulse 63    Temp 98.4 F (36.9 C) (Oral)    Resp 15    Ht 5\' 6"  (1.676 m)    Wt 98.4 kg    LMP 09/19/2016 (Approximate)    SpO2 100%    BMI 35.02 kg/m   Physical Exam Vitals signs and nursing note reviewed.  Constitutional:      General: She is not in acute distress.    Appearance: Normal appearance. She is not ill-appearing, toxic-appearing or diaphoretic.  HENT:     Head: Normocephalic.     Nose:  Nose normal.     Mouth/Throat:     Mouth: Mucous membranes are moist.  Eyes:     Conjunctiva/sclera: Conjunctivae normal.  Pulmonary:     Effort: Pulmonary effort is normal.  Abdominal:     General: Abdomen is flat. Bowel sounds are normal.  Musculoskeletal:     Left hip: Normal.     Left knee: She exhibits normal range of motion, no swelling, no ecchymosis, no erythema, normal alignment, normal patellar mobility and no bony tenderness. Tenderness found. Medial joint line and lateral joint line tenderness noted.  No MCL, no LCL and no patellar tendon tenderness noted.     Left ankle: Normal.     Lumbar back: She exhibits tenderness and pain. She exhibits normal range of motion, no bony tenderness, no swelling, no edema, no deformity, no laceration, no spasm and normal pulse.       Back:  Skin:    General: Skin is warm and dry.     Capillary Refill: Capillary refill takes less than 2 seconds.  Neurological:     General: No focal deficit present.     Mental Status: She is alert.     Sensory: No sensory deficit.     Motor: No weakness.     Gait: Gait normal.     Deep Tendon Reflexes: Reflexes normal.  Psychiatric:        Mood and Affect: Mood normal.      ED Treatments / Results  Labs (all labs ordered are listed, but only abnormal results are displayed) Labs Reviewed - No data to display  EKG None  Radiology Dg Lumbar Spine Complete  Result Date: 07/25/2019 CLINICAL DATA:  Fall EXAM: LUMBAR SPINE - COMPLETE 4+ VIEW COMPARISON:  None. FINDINGS: There is no evidence of lumbar spine fracture. Alignment is normal. Disc height loss with anterior osteophytes are most notable at L4-L5 and L5-S1. There is linear area of sclerosis seen within the anterior mid body of the L4 vertebral body. IMPRESSION: 1. Lumbar spine spondylosis most notable at L4-L5 and L5-S1, with progression since the prior exam of 2010. 2. Mild increased linear area of sclerosis seen within the anterior mid body  of the L4 vertebral body which could represent nondisplaced fracture. Electronically Signed   By: Prudencio Pair M.D.   On: 07/25/2019 20:39   Dg Knee Complete 4 Views Left  Result Date: 07/25/2019 CLINICAL DATA:  Fall. EXAM: LEFT KNEE - COMPLETE 4+ VIEW COMPARISON:  None. FINDINGS: No evidence of fracture, dislocation, or joint effusion. No evidence of arthropathy or other focal bone abnormality. Soft tissues are unremarkable. IMPRESSION: Negative. Electronically Signed   By: Kerby Moors M.D.   On: 07/25/2019 20:36    Procedures Procedures (including critical care time)  Medications Ordered in ED Medications - No data to display   Initial Impression / Assessment and Plan / ED Course  I have reviewed the triage vital signs and the nursing notes.  Pertinent labs & imaging results that were available during my care of the patient were reviewed by me and considered in my medical decision making (see chart for details).  Clinical Course as of Jul 25 2047  Thu Jul 25, 2019  2045 Patient presenting with back pain and left knee pain after a mechanical fall yesterday.  X-ray of the knee is negative for acute finding.  Lumbar x-ray shows some degenerative changes and a question of a nondisplaced fracture in the L4 vertebral body.  However, the patient does not point tender at the L4 region.  I doubt that this is an actual fracture.  However, I did discuss this with the patient.  Discussed treatment of compression fracture with rest, ice, NSAIDs and follow-up with primary care doctor.  Advised on strict return precautions.   [KM]    Clinical Course User Index [KM] Alveria Apley, PA-C       Based on review of vitals, medical screening exam, lab work and/or imaging, there does not appear to be an acute, emergent etiology for the patient's symptoms. Counseled pt on good return  precautions and encouraged both PCP and ED follow-up as needed.  Prior to discharge, I also discussed incidental  imaging findings with patient in detail and advised appropriate, recommended follow-up in detail.  Clinical Impression: 1. Fall, initial encounter   2. Sprain of left knee, unspecified ligament, initial encounter   3. Sprain of low back, initial encounter     Disposition: Discharge  Prior to providing a prescription for a controlled substance, I independently reviewed the patient's recent prescription history on the [place]. The patient had no recent or regular prescriptions and was deemed appropriate for a brief, less than 3 day prescription of narcotic for acute analgesia.  This note was prepared with assistance of Systems analyst. Occasional wrong-word or sound-a-like substitutions may have occurred due to the inherent limitations of voice recognition software.   Final Clinical Impressions(s) / ED Diagnoses   Final diagnoses:  Fall, initial encounter  Sprain of left knee, unspecified ligament, initial encounter  Sprain of low back, initial encounter    ED Discharge Orders         Ordered    naproxen (NAPROSYN) 500 MG tablet  2 times daily     07/25/19 2047    cyclobenzaprine (FLEXERIL) 10 MG tablet  2 times daily PRN     07/25/19 2047           Kristine Royal 07/25/19 2048    Fredia Sorrow, MD 08/05/19 [phone]

## 2019-07-25 NOTE — ED Triage Notes (Signed)
Pt fell yesterday and is having left knee pain and right lower back pain. Pt ambulatory with an unsteady gait

## 2019-07-25 NOTE — Discharge Instructions (Addendum)
Thank you for allowing me to care for you today. Please return to the emergency department if you have new or worsening symptoms. Take your medications as instructed.  ° °

## 2019-09-03 DIAGNOSIS — E113513 Type 2 diabetes mellitus with proliferative diabetic retinopathy with macular edema, bilateral: Secondary | ICD-10-CM | POA: Diagnosis not present

## 2019-09-03 DIAGNOSIS — H18413 Arcus senilis, bilateral: Secondary | ICD-10-CM | POA: Diagnosis not present

## 2019-09-03 DIAGNOSIS — H3582 Retinal ischemia: Secondary | ICD-10-CM | POA: Diagnosis not present

## 2019-09-03 DIAGNOSIS — H25013 Cortical age-related cataract, bilateral: Secondary | ICD-10-CM | POA: Diagnosis not present

## 2019-09-03 DIAGNOSIS — H2511 Age-related nuclear cataract, right eye: Secondary | ICD-10-CM | POA: Diagnosis not present

## 2019-09-03 DIAGNOSIS — H4311 Vitreous hemorrhage, right eye: Secondary | ICD-10-CM | POA: Diagnosis not present

## 2019-09-03 DIAGNOSIS — H25043 Posterior subcapsular polar age-related cataract, bilateral: Secondary | ICD-10-CM | POA: Diagnosis not present

## 2019-09-03 DIAGNOSIS — H2513 Age-related nuclear cataract, bilateral: Secondary | ICD-10-CM | POA: Diagnosis not present

## 2019-09-03 DIAGNOSIS — H4313 Vitreous hemorrhage, bilateral: Secondary | ICD-10-CM | POA: Diagnosis not present

## 2019-09-03 DIAGNOSIS — E113393 Type 2 diabetes mellitus with moderate nonproliferative diabetic retinopathy without macular edema, bilateral: Secondary | ICD-10-CM | POA: Diagnosis not present

## 2019-09-09 DIAGNOSIS — H4311 Vitreous hemorrhage, right eye: Secondary | ICD-10-CM | POA: Diagnosis not present

## 2019-09-09 DIAGNOSIS — H2511 Age-related nuclear cataract, right eye: Secondary | ICD-10-CM | POA: Diagnosis not present

## 2019-09-09 DIAGNOSIS — E113511 Type 2 diabetes mellitus with proliferative diabetic retinopathy with macular edema, right eye: Secondary | ICD-10-CM | POA: Diagnosis not present

## 2019-09-10 ENCOUNTER — Other Ambulatory Visit: Payer: Self-pay

## 2019-09-10 DIAGNOSIS — Z20822 Contact with and (suspected) exposure to covid-19: Secondary | ICD-10-CM

## 2019-09-12 LAB — NOVEL CORONAVIRUS, NAA: SARS-CoV-2, NAA: NOT DETECTED

## 2019-09-17 DIAGNOSIS — E113512 Type 2 diabetes mellitus with proliferative diabetic retinopathy with macular edema, left eye: Secondary | ICD-10-CM | POA: Diagnosis not present

## 2019-09-17 DIAGNOSIS — E113513 Type 2 diabetes mellitus with proliferative diabetic retinopathy with macular edema, bilateral: Secondary | ICD-10-CM | POA: Diagnosis not present

## 2019-10-15 DIAGNOSIS — E113513 Type 2 diabetes mellitus with proliferative diabetic retinopathy with macular edema, bilateral: Secondary | ICD-10-CM | POA: Diagnosis not present

## 2019-11-01 DIAGNOSIS — E119 Type 2 diabetes mellitus without complications: Secondary | ICD-10-CM | POA: Diagnosis not present

## 2019-11-01 DIAGNOSIS — N951 Menopausal and female climacteric states: Secondary | ICD-10-CM | POA: Diagnosis not present

## 2019-11-01 DIAGNOSIS — E1165 Type 2 diabetes mellitus with hyperglycemia: Secondary | ICD-10-CM | POA: Diagnosis not present

## 2019-11-01 DIAGNOSIS — Z1389 Encounter for screening for other disorder: Secondary | ICD-10-CM | POA: Diagnosis not present

## 2019-11-01 DIAGNOSIS — K047 Periapical abscess without sinus: Secondary | ICD-10-CM | POA: Diagnosis not present

## 2019-11-01 DIAGNOSIS — Z6833 Body mass index (BMI) 33.0-33.9, adult: Secondary | ICD-10-CM | POA: Diagnosis not present

## 2019-11-01 DIAGNOSIS — E6609 Other obesity due to excess calories: Secondary | ICD-10-CM | POA: Diagnosis not present

## 2019-11-01 DIAGNOSIS — Z0001 Encounter for general adult medical examination with abnormal findings: Secondary | ICD-10-CM | POA: Diagnosis not present

## 2019-11-01 DIAGNOSIS — I10 Essential (primary) hypertension: Secondary | ICD-10-CM | POA: Diagnosis not present

## 2019-11-12 ENCOUNTER — Emergency Department (HOSPITAL_COMMUNITY)
Admission: EM | Admit: 2019-11-12 | Discharge: 2019-11-12 | Disposition: A | Discharge status: Home / Self Care | Payer: BC Managed Care – PPO | Attending: Emergency Medicine | Admitting: Emergency Medicine

## 2019-11-12 ENCOUNTER — Emergency Department (HOSPITAL_COMMUNITY): Payer: BC Managed Care – PPO

## 2019-11-12 ENCOUNTER — Other Ambulatory Visit: Payer: Self-pay

## 2019-11-12 ENCOUNTER — Encounter (HOSPITAL_COMMUNITY): Payer: Self-pay | Admitting: Emergency Medicine

## 2019-11-12 DIAGNOSIS — U071 COVID-19: Secondary | ICD-10-CM | POA: Diagnosis not present

## 2019-11-12 DIAGNOSIS — R519 Headache, unspecified: Secondary | ICD-10-CM | POA: Diagnosis not present

## 2019-11-12 DIAGNOSIS — Z7984 Long term (current) use of oral hypoglycemic drugs: Secondary | ICD-10-CM | POA: Diagnosis not present

## 2019-11-12 DIAGNOSIS — E1165 Type 2 diabetes mellitus with hyperglycemia: Secondary | ICD-10-CM | POA: Insufficient documentation

## 2019-11-12 DIAGNOSIS — R739 Hyperglycemia, unspecified: Secondary | ICD-10-CM

## 2019-11-12 DIAGNOSIS — I10 Essential (primary) hypertension: Secondary | ICD-10-CM | POA: Insufficient documentation

## 2019-11-12 DIAGNOSIS — R1110 Vomiting, unspecified: Secondary | ICD-10-CM | POA: Diagnosis not present

## 2019-11-12 DIAGNOSIS — Z79899 Other long term (current) drug therapy: Secondary | ICD-10-CM | POA: Diagnosis not present

## 2019-11-12 DIAGNOSIS — R7611 Nonspecific reaction to tuberculin skin test without active tuberculosis: Secondary | ICD-10-CM | POA: Diagnosis not present

## 2019-11-12 LAB — CBC WITH DIFFERENTIAL/PLATELET
Abs Immature Granulocytes: 0.03 10*3/uL (ref 0.00–0.07)
Basophils Absolute: 0 10*3/uL (ref 0.0–0.1)
Basophils Relative: 0 %
Eosinophils Absolute: 0 10*3/uL (ref 0.0–0.5)
Eosinophils Relative: 0 %
HCT: 34.5 % — ABNORMAL LOW (ref 36.0–46.0)
Hemoglobin: 11.2 g/dL — ABNORMAL LOW (ref 12.0–15.0)
Immature Granulocytes: 1 %
Lymphocytes Relative: 21 %
Lymphs Abs: 0.8 10*3/uL (ref 0.7–4.0)
MCH: 27.9 pg (ref 26.0–34.0)
MCHC: 32.5 g/dL (ref 30.0–36.0)
MCV: 85.8 fL (ref 80.0–100.0)
Monocytes Absolute: 0.5 10*3/uL (ref 0.1–1.0)
Monocytes Relative: 13 %
Neutro Abs: 2.6 10*3/uL (ref 1.7–7.7)
Neutrophils Relative %: 65 %
Platelets: 273 10*3/uL (ref 150–400)
RBC: 4.02 MIL/uL (ref 3.87–5.11)
RDW: 13.2 % (ref 11.5–15.5)
WBC: 4 10*3/uL (ref 4.0–10.5)
nRBC: 0 % (ref 0.0–0.2)

## 2019-11-12 LAB — COMPREHENSIVE METABOLIC PANEL
ALT: 19 U/L (ref 0–44)
AST: 17 U/L (ref 15–41)
Albumin: 3.5 g/dL (ref 3.5–5.0)
Alkaline Phosphatase: 67 U/L (ref 38–126)
Anion gap: 9 (ref 5–15)
BUN: 28 mg/dL — ABNORMAL HIGH (ref 6–20)
CO2: 27 mmol/L (ref 22–32)
Calcium: 8.9 mg/dL (ref 8.9–10.3)
Chloride: 97 mmol/L — ABNORMAL LOW (ref 98–111)
Creatinine, Ser: 1.7 mg/dL — ABNORMAL HIGH (ref 0.44–1.00)
GFR calc Af Amer: 40 mL/min — ABNORMAL LOW (ref >60)
GFR calc non Af Amer: 35 mL/min — ABNORMAL LOW (ref >60)
Glucose, Bld: 465 mg/dL — ABNORMAL HIGH (ref 70–99)
Potassium: 4.3 mmol/L (ref 3.5–5.1)
Sodium: 133 mmol/L — ABNORMAL LOW (ref 135–145)
Total Bilirubin: 0.8 mg/dL (ref 0.3–1.2)
Total Protein: 7.1 g/dL (ref 6.5–8.1)

## 2019-11-12 LAB — POC SARS CORONAVIRUS 2 AG -  ED: SARS Coronavirus 2 Ag: POSITIVE — AB

## 2019-11-12 MED ORDER — ALBUTEROL SULFATE HFA 108 (90 BASE) MCG/ACT IN AERS
6.0000 | INHALATION_SPRAY | Freq: Once | RESPIRATORY_TRACT | Status: AC
  Administered 2019-11-12: 18:00:00 8 via RESPIRATORY_TRACT
  Filled 2019-11-12: qty 6.7

## 2019-11-12 MED ORDER — ACETAMINOPHEN 325 MG PO TABS
650.0000 mg | ORAL_TABLET | Freq: Once | ORAL | Status: AC
  Administered 2019-11-12: 650 mg via ORAL
  Filled 2019-11-12: qty 2

## 2019-11-12 MED ORDER — ONDANSETRON HCL 4 MG/2ML IJ SOLN
4.0000 mg | Freq: Once | INTRAMUSCULAR | Status: AC
  Administered 2019-11-12: 18:00:00 4 mg via INTRAVENOUS
  Filled 2019-11-12: qty 2

## 2019-11-12 MED ORDER — SODIUM CHLORIDE 0.9 % IV BOLUS
1000.0000 mL | Freq: Once | INTRAVENOUS | Status: AC
  Administered 2019-11-12: 1000 mL via INTRAVENOUS

## 2019-11-12 NOTE — ED Triage Notes (Signed)
Pt reports chills, headache, nausea, body aches for last several days.

## 2019-11-12 NOTE — ED Provider Notes (Signed)
[facility] EMERGENCY DEPARTMENT Provider Note   CSN: [id number] Arrival date & time: 11/12/19  1450     History Chief Complaint  Patient presents with  . Emesis    Alexis Landry is a 49 y.o. female with past medical history significant for type 2 diabetes, hypertension presenting to emergency department today with chief complaint of emesis x1 day. She states last night she had subjective fever and chills.  She went to work today and while eating lunch noticed that she could not taste her food.  She also continued to have chills and then developed a headache and nausea.  She had one episode of nonbloody nonbilious emesis.  Headache has progressively worsened since onset and feels like headaches she has had in the past.  She denies any associated neck pain.  She came to the emergency department for Covid testing as she also notices feeling short of breath with exertion and nonproductive cough over the last x2 days.  She denies any known positive Covid contacts.  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  She did not take any medications for symptoms prior to arrival.  Denies fever, syncope, chest pain,  head trauma, photophobia, phonophobia, UL throbbing, N/V, visual changes, stiff neck, neck pain, rash, or "thunderclap" onset.    Past Medical History:  Diagnosis Date  . Diabetic neuropathy ([place])   . DM (diabetes mellitus), type 2, uncontrolled ([place]) 02/24/2015  . Epiretinal membrane, right   . GERD (gastroesophageal reflux disease) 02/24/2015  . HTN (hypertension) 02/24/2015  . Hyperlipidemia 02/24/2015  . Macular edema, diabetic ([place])   . Rheumatoid arthritis ([place]) 02/24/2015  . Vitamin D deficiency disease 02/24/2015    Patient Active Problem List   Diagnosis Date Noted  . Encounter for screening colonoscopy 12/19/2016  . Constipation 12/19/2016  . Vitamin D deficiency disease 02/24/2015  . HTN (hypertension) 02/24/2015  .  Hyperlipidemia 02/24/2015  . GERD (gastroesophageal reflux disease) 02/24/2015  . DM (diabetes mellitus), type 2, uncontrolled ([place]) 02/24/2015  . Rheumatoid arthritis ([place]) 02/24/2015    Past Surgical History:  Procedure Laterality Date  . COLONOSCOPY N/A 01/04/2017   Procedure: COLONOSCOPY;  Surgeon: Danie Binder, MD;  [facility];  Service: Endoscopy;  Laterality: N/A;  10:30am  . KENALOG INJECTION Right 12/03/2018   Procedure: LUCENTIS 0.3MG  INJECTION;  Surgeon: Sherlynn Stalls, MD;  Location: [place];  Service: Ophthalmology;  Laterality: Right;  . MEMBRANE PEEL Right 12/03/2018   Procedure: MEMBRANE PEEL;  Surgeon: Sherlynn Stalls, MD;  Location: [place];  Service: Ophthalmology;  Laterality: Right;  . PARS PLANA VITRECTOMY Right 12/03/2018   Procedure: PARS PLANA VITRECTOMY WITH 25 GAUGE;  Surgeon: Sherlynn Stalls, MD;  Location: [place];  Service: Ophthalmology;  Laterality: Right;  . PHOTOCOAGULATION WITH LASER Right 12/03/2018   Procedure: PHOTOCOAGULATION WITH LASER;  Surgeon: Sherlynn Stalls, MD;  Location: [place];  Service: Ophthalmology;  Laterality: Right;  . TUBAL LIGATION       OB History    Gravida  5   Para  4   Term      Preterm      AB  1   Living  4     SAB      TAB      Ectopic      Multiple      Live Births              Family History  Problem Relation Age of Onset  . Heart disease Mother   . Kidney disease Father   . Diabetes Maternal Grandfather   . Diabetes Paternal Grandmother   . Diabetes Paternal Grandfather   . Colon cancer Neg Hx     Social History   Tobacco Use  . Smoking status: Never Smoker  . Smokeless tobacco: Never Used  Substance Use Topics  . Alcohol use: Yes    Alcohol/week: 0.0 standard drinks    Comment: two times per month  . Drug use: No    Home Medications Prior to Admission medications   Medication Sig Start Date End Date Taking? Authorizing Provider  atorvastatin (LIPITOR) 10 MG tablet Take 10 mg by  mouth every evening.     [provider]  BESIVANCE 0.6 % SUSP Place 1 drop into the right eye 3 (three) times daily. 09/07/19   [provider]  cloNIDine HCl (KAPVAY) 0.1 MG TB12 ER tablet Take 0.1 mg by mouth at bedtime.    [provider]  DUREZOL 0.05 % EMUL Place 1 drop into the right eye 3 (three) times daily. 10/03/19   [provider]  JANUVIA 100 MG tablet Take 100 mg by mouth daily. 05/17/18   [provider]  losartan-hydrochlorothiazide (HYZAAR) 100-25 MG tablet Take 1 tablet by mouth daily.    [provider]  meloxicam (MOBIC) 15 MG tablet Take 15 mg by mouth daily as needed for pain.    [provider]  metFORMIN (GLUCOPHAGE-XR) 500 MG 24 hr tablet Take 500 mg by mouth 2 (two) times daily.    [provider]  naproxen (NAPROSYN) 500 MG tablet Take 1 tablet (500 mg total) by mouth 2 (two) times daily. 07/25/19   Madilyn Hook A, PA-C  naproxen sodium (ALEVE) 220 MG tablet Take 440 mg by mouth daily as needed (pain).    [provider]  PROLENSA 0.07 % SOLN Place 1 drop into the right eye at bedtime. 09/03/19   [provider]  TRESIBA FLEXTOUCH 100 UNIT/ML SOPN FlexTouch Pen Inject 12 Units into the skin at bedtime.  05/08/18   [provider]    Allergies    Lisinopril and Sulfa antibiotics  Review of Systems   Review of Systems  All other systems are reviewed and are negative for acute change except as noted in the HPI.   Physical Exam Updated Vital Signs BP (!) 159/79 (BP Location: Right Arm)   Pulse 95   Temp (!) 101.4 F (38.6 C) (Oral)   Resp 16   Ht 5\' 6"  (1.676 m)   Wt 100.7 kg   LMP 09/19/2016 (Approximate)   SpO2 100%   BMI 35.83 kg/m   Physical Exam Vitals and nursing note reviewed.  Constitutional:      General: She is not in acute distress.    Appearance: She is not ill-appearing.  HENT:     Head: Normocephalic and atraumatic.     Comments: No sinus or  temporal tenderness.    Right Ear: Tympanic membrane and external ear normal.     Left Ear: Tympanic membrane and external ear normal.     Nose: Nose normal.     Mouth/Throat:     Mouth: Mucous membranes are moist.     Pharynx: Oropharynx is clear.  Eyes:     General: No scleral icterus.       Right eye: No discharge.        Left eye: No discharge.  Extraocular Movements: Extraocular movements intact.     Conjunctiva/sclera: Conjunctivae normal.     Pupils: Pupils are equal, round, and reactive to light.  Neck:     Vascular: No JVD.     Comments: Full ROM intact without spinous process TTP. No bony stepoffs or deformities, no paraspinous muscle TTP or muscle spasms. No rigidity or meningeal signs. No bruising, erythema, or swelling.  Cardiovascular:     Rate and Rhythm: Normal rate and regular rhythm.     Pulses: Normal pulses.          Radial pulses are 2+ on the right side and 2+ on the left side.     Heart sounds: Normal heart sounds.  Pulmonary:     Comments: Lungs clear to auscultation in all fields. Symmetric chest rise. No wheezing, rales, or rhonchi. SpO2 is 99% on room air. No respiratory distress. Abdominal:     Comments: Abdomen is soft, non-distended, and non-tender in all quadrants. No rigidity, no guarding. No peritoneal signs.  Musculoskeletal:        General: Normal range of motion.     Cervical back: Normal range of motion.  Skin:    General: Skin is warm and dry.     Capillary Refill: Capillary refill takes less than 2 seconds.     Findings: No rash.  Neurological:     Mental Status: She is oriented to person, place, and time.     GCS: GCS eye subscore is 4. GCS verbal subscore is 5. GCS motor subscore is 6.     Comments: Fluent speech, no facial droop.  Speech is clear and goal oriented, follows commands CN III-XII intact, no facial droop Normal strength in upper and lower extremities bilaterally including dorsiflexion and plantar flexion, strong and  equal grip strength Sensation normal to light and sharp touch Moves extremities without ataxia, coordination intact Normal finger to nose and rapid alternating movements Normal gait and balance   Psychiatric:        Behavior: Behavior normal.       ED Results / Procedures / Treatments   Labs (all labs ordered are listed, but only abnormal results are displayed) Labs Reviewed  CBC WITH DIFFERENTIAL/PLATELET - Abnormal; Notable for the following components:      Result Value   Hemoglobin 11.2 (*)    HCT 34.5 (*)    All other components within normal limits  COMPREHENSIVE METABOLIC PANEL - Abnormal; Notable for the following components:   Sodium 133 (*)    Chloride 97 (*)    Glucose, Bld 465 (*)    BUN 28 (*)    Creatinine, Ser 1.70 (*)    GFR calc non Af Amer 35 (*)    GFR calc Af Amer 40 (*)    All other components within normal limits  POC SARS CORONAVIRUS 2 AG -  ED - Abnormal; Notable for the following components:   SARS Coronavirus 2 Ag POSITIVE (*)    All other components within normal limits    EKG None  Radiology DG Chest Portable 1 View  Result Date: 11/12/2019 CLINICAL DATA:  49 year old female with positive COVID-19. EXAM: PORTABLE CHEST 1 VIEW COMPARISON:  Chest radiograph dated 11/23/2018. FINDINGS: There is no focal consolidation, pleural effusion, pneumothorax. The cardiac silhouette is within normal limits. No acute osseous pathology. IMPRESSION: No active disease. Electronically Signed   By: Anner Crete M.D.   On: 11/12/2019 16:54    Procedures Procedures (including critical care time)  Medications  Ordered in ED Medications  sodium chloride 0.9 % bolus 1,000 mL (0 mLs Intravenous Stopped 11/12/19 2010)  acetaminophen (TYLENOL) tablet 650 mg (650 mg Oral Given 11/12/19 1804)  ondansetron (ZOFRAN) injection 4 mg (4 mg Intravenous Given 11/12/19 1805)  albuterol (VENTOLIN HFA) 108 (90 Base) MCG/ACT inhaler 6-8 puff (8 puffs Inhalation Given 11/12/19 1804)      ED Course  I have reviewed the triage vital signs and the nursing notes.  Pertinent labs & imaging results that were available during my care of the patient were reviewed by me and considered in my medical decision making (see chart for details).    MDM Rules/Calculators/A&P                      Patient seen and examined. Patient presents awake, alert, hemodynamically stable, non toxic. She was febrile to 101.4  in triage, no tachycardia or hypoxia. On my exam patient looks to not feel well, but is not septic or toxic appearing. Patient test for covid in triage with positive result.  Neuro exam is normal. Headache is in the setting of covid, feel that fever is related to covid. No meningeal signs, no rash. Presentation is like pts typical HA and non concerning for [facility], ICH, Meningitis, or temporal arteritis. Pt has no focal neuro deficits, nuchal rigidity, or change in vision. Lungs are clear to auscultation in all fields. Patient ambulated in the emergency department without respiratory distress, tachycardia, or hypoxia. SpO2 during ambulation >94% on room air.  CBC without leukocytosis, anemia consistent with baseline. CMP with hyperglycemia 465, elevated kidney function with BUN/creatinine 28/1.70. No severe electrolyte derangement. Normal anion gap of 9, DKA unlikely. IVF, tylenol, albuterol, zofran given.  On reassessment patient reports headache has improved. She is tolerating PO intake while in ED. No indications for admission at this time. Will discharge home with symptomatic care. Findings and plan of care discussed with supervising physician Dr. Lacinda Axon who agrees with plan.   The patient appears reasonably screened and/or stabilized for discharge and I doubt any other medical condition or other [facility] requiring further screening, evaluation, or treatment in the ED at this time prior to discharge. The patient is safe for discharge with strict return precautions discussed. Recommend pcp  follow up to further discuss hyperglycemia and have creatinine rechecked within 1 week. Patient appears reliable for follow up.   Alexis Landry was evaluated in Emergency Department on 11/13/2019 for the symptoms described in the history of present illness. She was evaluated in the context of the global COVID-19 pandemic, which necessitated consideration that the patient might be at risk for infection with the SARS-CoV-2 virus that causes COVID-19. Institutional protocols and algorithms that pertain to the evaluation of patients at risk for COVID-19 are in a state of rapid change based on information released by regulatory bodies including the CDC and federal and state organizations. These policies and algorithms were followed during the patient's care in the ED.   Portions of this note were generated with [employer]. Dictation errors may occur despite best attempts at proofreading.   Final Clinical Impression(s) / ED Diagnoses Final diagnoses:  [id number] virus infection  Hyperglycemia    Rx / DC Orders ED Discharge Orders    None       Flint Melter 11/13/19 Prentiss Bells, MD 11/13/19 1524

## 2019-11-12 NOTE — Discharge Instructions (Addendum)
Thank you for allowing [place] to care for you today.   Please return to the emergency department if you have any new or worsening symptoms.  You tested positive for covid-19 today.   Also your blood sugar was elevated in the 400s. Please monitor your blood sugar closely at home and follow up with your priamry care doctor to recheck and discuss this.  Medications- You can take medications to help treat your symptoms: -Tylenol for fever and body aches. Please take as prescribed on the bottle. -Over the coutner cough medicine such as mucinex, robitussin, or other brands. -Flonase or saline nasal spray for nasal congestion -Vitamins as recommended by CDC  Treatment- This is a virus and unfortunately there are no antibitotics approved to treat this virus at this time. It is important to monitor your symptoms closely: -You should have a theremometer at home to check your temperature when feeling feverish. -Use a pulse ox meter to measure your oxygen when feeling short of breath.  -If your fever is over 100.4 despite taking tylenol or if your oxygen level drops below 94% these are reasons to rturn to the emergency department for further evaluation. Please call the emergency department before you come to make [place] aware.    We recommend you self-isolate for 10 days and to inform your work/family/friends that you has the virus.  They will need to self-quarantine for 14 days to monitor for symptoms.    Again: symptoms of shortness of breath, chest pain, difficulty breathing, new onset of confusion, any symptoms that are concerning. If any of these symptoms you should come to emergency department for evaluation.   I hope you feel better soon

## 2019-11-13 ENCOUNTER — Telehealth: Payer: Self-pay | Admitting: Internal Medicine

## 2019-11-13 NOTE — Telephone Encounter (Signed)
Called to discuss with patient about Covid symptoms and the use of bamlanivimab, a monoclonal antibody infusion for those with mild to moderate Covid symptoms and at high risk of hospitalization.   Pt appears to qualify for this infusion at [facility] infusion center due to co-morbid conditions and/or member of at risk group. Hx of DM2. Need to determine duration of symptoms.   Unable to reach pt. Left message on MyChart.  Alan Ripper, NP-C [place]

## 2019-11-14 DIAGNOSIS — U071 COVID-19: Secondary | ICD-10-CM | POA: Diagnosis not present

## 2019-11-14 DIAGNOSIS — I10 Essential (primary) hypertension: Secondary | ICD-10-CM | POA: Diagnosis not present

## 2019-12-20 DIAGNOSIS — H4312 Vitreous hemorrhage, left eye: Secondary | ICD-10-CM | POA: Diagnosis not present

## 2019-12-20 DIAGNOSIS — H3582 Retinal ischemia: Secondary | ICD-10-CM | POA: Diagnosis not present

## 2019-12-20 DIAGNOSIS — E113513 Type 2 diabetes mellitus with proliferative diabetic retinopathy with macular edema, bilateral: Secondary | ICD-10-CM | POA: Diagnosis not present

## 2019-12-27 DIAGNOSIS — R0989 Other specified symptoms and signs involving the circulatory and respiratory systems: Secondary | ICD-10-CM | POA: Diagnosis not present

## 2020-01-17 DIAGNOSIS — Z6832 Body mass index (BMI) 32.0-32.9, adult: Secondary | ICD-10-CM | POA: Diagnosis not present

## 2020-01-17 DIAGNOSIS — E1165 Type 2 diabetes mellitus with hyperglycemia: Secondary | ICD-10-CM | POA: Diagnosis not present

## 2020-01-17 DIAGNOSIS — Z124 Encounter for screening for malignant neoplasm of cervix: Secondary | ICD-10-CM | POA: Diagnosis not present

## 2020-01-17 DIAGNOSIS — I10 Essential (primary) hypertension: Secondary | ICD-10-CM | POA: Diagnosis not present

## 2020-01-17 DIAGNOSIS — Z9189 Other specified personal risk factors, not elsewhere classified: Secondary | ICD-10-CM | POA: Diagnosis not present

## 2020-01-17 DIAGNOSIS — E6609 Other obesity due to excess calories: Secondary | ICD-10-CM | POA: Diagnosis not present

## 2020-01-17 DIAGNOSIS — E7849 Other hyperlipidemia: Secondary | ICD-10-CM | POA: Diagnosis not present

## 2020-01-28 ENCOUNTER — Other Ambulatory Visit (HOSPITAL_COMMUNITY): Payer: Self-pay | Admitting: Family Medicine

## 2020-01-28 DIAGNOSIS — H26491 Other secondary cataract, right eye: Secondary | ICD-10-CM | POA: Diagnosis not present

## 2020-01-28 DIAGNOSIS — Z1231 Encounter for screening mammogram for malignant neoplasm of breast: Secondary | ICD-10-CM

## 2020-01-28 DIAGNOSIS — H4313 Vitreous hemorrhage, bilateral: Secondary | ICD-10-CM | POA: Diagnosis not present

## 2020-01-28 DIAGNOSIS — E113513 Type 2 diabetes mellitus with proliferative diabetic retinopathy with macular edema, bilateral: Secondary | ICD-10-CM | POA: Diagnosis not present

## 2020-02-03 DIAGNOSIS — E113511 Type 2 diabetes mellitus with proliferative diabetic retinopathy with macular edema, right eye: Secondary | ICD-10-CM | POA: Diagnosis not present

## 2020-02-03 DIAGNOSIS — H26491 Other secondary cataract, right eye: Secondary | ICD-10-CM | POA: Diagnosis not present

## 2020-02-03 DIAGNOSIS — H4311 Vitreous hemorrhage, right eye: Secondary | ICD-10-CM | POA: Diagnosis not present

## 2020-02-03 DIAGNOSIS — H43311 Vitreous membranes and strands, right eye: Secondary | ICD-10-CM | POA: Diagnosis not present

## 2020-02-04 DIAGNOSIS — E113511 Type 2 diabetes mellitus with proliferative diabetic retinopathy with macular edema, right eye: Secondary | ICD-10-CM | POA: Diagnosis not present

## 2020-02-11 DIAGNOSIS — E113511 Type 2 diabetes mellitus with proliferative diabetic retinopathy with macular edema, right eye: Secondary | ICD-10-CM | POA: Diagnosis not present

## 2020-02-14 ENCOUNTER — Ambulatory Visit (HOSPITAL_COMMUNITY)
Admission: RE | Admit: 2020-02-14 | Discharge: 2020-02-14 | Disposition: A | Discharge status: Home / Self Care | Payer: BC Managed Care – PPO | Source: Ambulatory Visit | Attending: Family Medicine | Admitting: Family Medicine

## 2020-02-14 ENCOUNTER — Other Ambulatory Visit: Payer: Self-pay

## 2020-02-14 DIAGNOSIS — Z1231 Encounter for screening mammogram for malignant neoplasm of breast: Secondary | ICD-10-CM | POA: Insufficient documentation

## 2020-03-03 DIAGNOSIS — E113532 Type 2 diabetes mellitus with proliferative diabetic retinopathy with traction retinal detachment not involving the macula, left eye: Secondary | ICD-10-CM | POA: Diagnosis not present

## 2020-03-03 DIAGNOSIS — H4312 Vitreous hemorrhage, left eye: Secondary | ICD-10-CM | POA: Diagnosis not present

## 2020-03-10 DIAGNOSIS — Z794 Long term (current) use of insulin: Secondary | ICD-10-CM | POA: Diagnosis not present

## 2020-03-10 DIAGNOSIS — Z961 Presence of intraocular lens: Secondary | ICD-10-CM | POA: Diagnosis not present

## 2020-03-10 DIAGNOSIS — H2512 Age-related nuclear cataract, left eye: Secondary | ICD-10-CM | POA: Diagnosis not present

## 2020-03-10 DIAGNOSIS — H25012 Cortical age-related cataract, left eye: Secondary | ICD-10-CM | POA: Diagnosis not present

## 2020-03-10 DIAGNOSIS — H18413 Arcus senilis, bilateral: Secondary | ICD-10-CM | POA: Diagnosis not present

## 2020-03-10 DIAGNOSIS — E113393 Type 2 diabetes mellitus with moderate nonproliferative diabetic retinopathy without macular edema, bilateral: Secondary | ICD-10-CM | POA: Diagnosis not present

## 2020-03-16 DIAGNOSIS — H4311 Vitreous hemorrhage, right eye: Secondary | ICD-10-CM | POA: Diagnosis not present

## 2020-03-16 DIAGNOSIS — E113591 Type 2 diabetes mellitus with proliferative diabetic retinopathy without macular edema, right eye: Secondary | ICD-10-CM | POA: Diagnosis not present

## 2020-03-16 DIAGNOSIS — E113532 Type 2 diabetes mellitus with proliferative diabetic retinopathy with traction retinal detachment not involving the macula, left eye: Secondary | ICD-10-CM | POA: Diagnosis not present

## 2020-03-23 ENCOUNTER — Encounter (HOSPITAL_COMMUNITY): Payer: Self-pay | Admitting: *Deleted

## 2020-03-23 ENCOUNTER — Other Ambulatory Visit: Payer: Self-pay

## 2020-03-23 ENCOUNTER — Emergency Department (HOSPITAL_COMMUNITY): Payer: BC Managed Care – PPO

## 2020-03-23 ENCOUNTER — Emergency Department (HOSPITAL_COMMUNITY)
Admission: EM | Admit: 2020-03-23 | Discharge: 2020-03-23 | Disposition: A | Discharge status: Home / Self Care | Payer: BC Managed Care – PPO | Attending: Emergency Medicine | Admitting: Emergency Medicine

## 2020-03-23 DIAGNOSIS — Z5321 Procedure and treatment not carried out due to patient leaving prior to being seen by health care provider: Secondary | ICD-10-CM | POA: Insufficient documentation

## 2020-03-23 DIAGNOSIS — R079 Chest pain, unspecified: Secondary | ICD-10-CM | POA: Diagnosis not present

## 2020-03-23 LAB — CBC
HCT: 32.1 % — ABNORMAL LOW (ref 36.0–46.0)
Hemoglobin: 10.4 g/dL — ABNORMAL LOW (ref 12.0–15.0)
MCH: 28.4 pg (ref 26.0–34.0)
MCHC: 32.4 g/dL (ref 30.0–36.0)
MCV: 87.7 fL (ref 80.0–100.0)
Platelets: 323 10*3/uL (ref 150–400)
RBC: 3.66 MIL/uL — ABNORMAL LOW (ref 3.87–5.11)
RDW: 13.6 % (ref 11.5–15.5)
WBC: 7.7 10*3/uL (ref 4.0–10.5)
nRBC: 0 % (ref 0.0–0.2)

## 2020-03-23 LAB — BASIC METABOLIC PANEL
Anion gap: 7 (ref 5–15)
BUN: 24 mg/dL — ABNORMAL HIGH (ref 6–20)
CO2: 28 mmol/L (ref 22–32)
Calcium: 9 mg/dL (ref 8.9–10.3)
Chloride: 104 mmol/L (ref 98–111)
Creatinine, Ser: 1.41 mg/dL — ABNORMAL HIGH (ref 0.44–1.00)
GFR calc Af Amer: 50 mL/min — ABNORMAL LOW (ref >60)
GFR calc non Af Amer: 43 mL/min — ABNORMAL LOW (ref >60)
Glucose, Bld: 265 mg/dL — ABNORMAL HIGH (ref 70–99)
Potassium: 4.1 mmol/L (ref 3.5–5.1)
Sodium: 139 mmol/L (ref 135–145)

## 2020-03-23 LAB — TROPONIN I (HIGH SENSITIVITY): Troponin I (High Sensitivity): 7 ng/L (ref <18)

## 2020-03-23 MED ORDER — SODIUM CHLORIDE 0.9% FLUSH: 3.0000 mL | Freq: Once | INTRAVENOUS | Status: DC

## 2020-03-23 NOTE — ED Triage Notes (Signed)
Chest pain x 2 days

## 2020-03-30 DIAGNOSIS — H2512 Age-related nuclear cataract, left eye: Secondary | ICD-10-CM | POA: Diagnosis not present

## 2020-03-30 DIAGNOSIS — E113512 Type 2 diabetes mellitus with proliferative diabetic retinopathy with macular edema, left eye: Secondary | ICD-10-CM | POA: Diagnosis not present

## 2020-03-30 DIAGNOSIS — H35372 Puckering of macula, left eye: Secondary | ICD-10-CM | POA: Diagnosis not present

## 2020-03-30 DIAGNOSIS — H4312 Vitreous hemorrhage, left eye: Secondary | ICD-10-CM | POA: Diagnosis not present

## 2020-04-07 DIAGNOSIS — E113511 Type 2 diabetes mellitus with proliferative diabetic retinopathy with macular edema, right eye: Secondary | ICD-10-CM | POA: Diagnosis not present

## 2020-04-07 DIAGNOSIS — E113532 Type 2 diabetes mellitus with proliferative diabetic retinopathy with traction retinal detachment not involving the macula, left eye: Secondary | ICD-10-CM | POA: Diagnosis not present

## 2020-04-14 DIAGNOSIS — H4311 Vitreous hemorrhage, right eye: Secondary | ICD-10-CM | POA: Diagnosis not present

## 2020-04-14 DIAGNOSIS — E113532 Type 2 diabetes mellitus with proliferative diabetic retinopathy with traction retinal detachment not involving the macula, left eye: Secondary | ICD-10-CM | POA: Diagnosis not present

## 2020-04-28 DIAGNOSIS — E113513 Type 2 diabetes mellitus with proliferative diabetic retinopathy with macular edema, bilateral: Secondary | ICD-10-CM | POA: Diagnosis not present

## 2020-05-26 DIAGNOSIS — E113511 Type 2 diabetes mellitus with proliferative diabetic retinopathy with macular edema, right eye: Secondary | ICD-10-CM | POA: Diagnosis not present

## 2020-06-23 DIAGNOSIS — E113513 Type 2 diabetes mellitus with proliferative diabetic retinopathy with macular edema, bilateral: Secondary | ICD-10-CM | POA: Diagnosis not present

## 2020-07-03 DIAGNOSIS — E6609 Other obesity due to excess calories: Secondary | ICD-10-CM | POA: Diagnosis not present

## 2020-07-03 DIAGNOSIS — R5383 Other fatigue: Secondary | ICD-10-CM | POA: Diagnosis not present

## 2020-07-03 DIAGNOSIS — E1165 Type 2 diabetes mellitus with hyperglycemia: Secondary | ICD-10-CM | POA: Diagnosis not present

## 2020-07-03 DIAGNOSIS — I10 Essential (primary) hypertension: Secondary | ICD-10-CM | POA: Diagnosis not present

## 2020-07-03 DIAGNOSIS — E538 Deficiency of other specified B group vitamins: Secondary | ICD-10-CM | POA: Diagnosis not present

## 2020-07-03 DIAGNOSIS — Z6835 Body mass index (BMI) 35.0-35.9, adult: Secondary | ICD-10-CM | POA: Diagnosis not present

## 2020-07-03 DIAGNOSIS — E7849 Other hyperlipidemia: Secondary | ICD-10-CM | POA: Diagnosis not present

## 2020-07-24 DIAGNOSIS — H26492 Other secondary cataract, left eye: Secondary | ICD-10-CM | POA: Diagnosis not present

## 2020-07-24 DIAGNOSIS — E113513 Type 2 diabetes mellitus with proliferative diabetic retinopathy with macular edema, bilateral: Secondary | ICD-10-CM | POA: Diagnosis not present

## 2020-08-24 DIAGNOSIS — E113513 Type 2 diabetes mellitus with proliferative diabetic retinopathy with macular edema, bilateral: Secondary | ICD-10-CM | POA: Diagnosis not present

## 2020-08-31 ENCOUNTER — Ambulatory Visit
Admission: EM | Admit: 2020-08-31 | Discharge: 2020-08-31 | Disposition: A | Discharge status: Home / Self Care | Payer: BC Managed Care – PPO

## 2020-08-31 ENCOUNTER — Other Ambulatory Visit: Payer: Self-pay

## 2020-08-31 DIAGNOSIS — R0789 Other chest pain: Secondary | ICD-10-CM

## 2020-08-31 DIAGNOSIS — I440 Atrioventricular block, first degree: Secondary | ICD-10-CM | POA: Diagnosis not present

## 2020-08-31 DIAGNOSIS — Z1152 Encounter for screening for COVID-19: Secondary | ICD-10-CM | POA: Diagnosis not present

## 2020-08-31 DIAGNOSIS — R0602 Shortness of breath: Secondary | ICD-10-CM | POA: Diagnosis not present

## 2020-08-31 MED ORDER — BENZONATATE 100 MG PO CAPS
100.0000 mg | ORAL_CAPSULE | Freq: Three times a day (TID) | ORAL | 0 refills | Status: DC
Start: 2020-08-31 — End: 2020-10-23

## 2020-08-31 MED ORDER — ALBUTEROL SULFATE HFA 108 (90 BASE) MCG/ACT IN AERS: 1.0000 | INHALATION_SPRAY | Freq: Four times a day (QID) | RESPIRATORY_TRACT | 0 refills | Status: DC | PRN

## 2020-08-31 NOTE — ED Provider Notes (Signed)
[place]   [id number] 08/31/20 Arrival Time: 1232   CC: CHEST PAIN  SUBJECTIVE:  Alexis Landry is a 49 y.o. female who presented to the urgent care for complaint of chest pressure, cough and shortness of breath that started yesterday.  Denies a precipitating event, trauma,  or, strenuous upper body activities.  Localizes chest pressure to the substernal region.  Describes as stable, that is constant with a wax and waning pain and achy in character.  Rates pain as 5/10.   Has tried OTC medication without relief.  Denies aggravating or alleviating factors.  Denies radiates symptoms.  Denies previous symptoms in the past.  Denies fever, chills, lightheadedness, dizziness, palpitations, tachycardia,  nausea, vomiting, abdominal pain, changes in bowel or bladder habits, diaphoresis, numbness/tingling in extremities, peripheral edema, or anxiety.    Denies close relatives with cardiac hx  Previous cardiac testing: none.  ROS: As per HPI.  All other pertinent ROS negative.    Past Medical History:  Diagnosis Date   Diabetic neuropathy ([place])    DM (diabetes mellitus), type 2, uncontrolled ([place]) 02/24/2015   Epiretinal membrane, right    GERD (gastroesophageal reflux disease) 02/24/2015   HTN (hypertension) 02/24/2015   Hyperlipidemia 02/24/2015   Macular edema, diabetic ([place])    Rheumatoid arthritis ([place]) 02/24/2015   Vitamin D deficiency disease 02/24/2015   Past Surgical History:  Procedure Laterality Date   COLONOSCOPY N/A 01/04/2017   Procedure: COLONOSCOPY;  Surgeon: Danie Binder, MD;  [facility];  Service: Endoscopy;  Laterality: N/A;  10:30am   KENALOG INJECTION Right 12/03/2018   Procedure: LUCENTIS 0.3MG  INJECTION;  Surgeon: Sherlynn Stalls, MD;  Location: [place];  Service: Ophthalmology;  Laterality: Right;   MEMBRANE PEEL Right 12/03/2018   Procedure: MEMBRANE PEEL;  Surgeon: Sherlynn Stalls, MD;  Location: [place];  Service: Ophthalmology;   Laterality: Right;   PARS PLANA VITRECTOMY Right 12/03/2018   Procedure: PARS PLANA VITRECTOMY WITH 25 GAUGE;  Surgeon: Sherlynn Stalls, MD;  Location: [place];  Service: Ophthalmology;  Laterality: Right;   PHOTOCOAGULATION WITH LASER Right 12/03/2018   Procedure: PHOTOCOAGULATION WITH LASER;  Surgeon: Sherlynn Stalls, MD;  Location: [place];  Service: Ophthalmology;  Laterality: Right;   TUBAL LIGATION     Allergies  Allergen Reactions   Lisinopril Anaphylaxis    Throat swelling    Sulfa Antibiotics Itching   No current facility-administered medications on file prior to encounter.   Current Outpatient Medications on File Prior to Encounter  Medication Sig Dispense Refill   atorvastatin (LIPITOR) 10 MG tablet Take 10 mg by mouth every evening.      BESIVANCE 0.6 % SUSP Place 1 drop into the right eye 3 (three) times daily.     cloNIDine HCl (KAPVAY) 0.1 MG TB12 ER tablet Take 0.1 mg by mouth at bedtime.     DUREZOL 0.05 % EMUL Place 1 drop into the right eye 3 (three) times daily.     JANUVIA 100 MG tablet Take 100 mg by mouth daily.  3   losartan-hydrochlorothiazide (HYZAAR) 100-25 MG tablet Take 1 tablet by mouth daily.     meloxicam (MOBIC) 15 MG tablet Take 15 mg by mouth daily as needed for pain.     metFORMIN (GLUCOPHAGE-XR) 500 MG 24 hr tablet Take 500 mg by mouth 2 (two) times daily.     naproxen (NAPROSYN) 500 MG tablet Take 1 tablet (500 mg total) by mouth 2 (two) times daily. 30 tablet 0  naproxen sodium (ALEVE) 220 MG tablet Take 440 mg by mouth daily as needed (pain).     PROLENSA 0.07 % SOLN Place 1 drop into the right eye at bedtime.     SYNJARDY 12.01-999 MG TABS Take by mouth.     TRESIBA FLEXTOUCH 100 UNIT/ML SOPN FlexTouch Pen Inject 12 Units into the skin at bedtime.   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  Tobacco Use   Smoking status: Never Smoker   Smokeless tobacco: Never Used  Vaping Use   Vaping Use: Never used  Substance and Sexual Activity   Alcohol use: Yes    Alcohol/week: 0.0 standard drinks    Comment: two times per month   Drug use: No   Sexual activity: Not Currently    Birth control/protection: Surgical  Other Topics Concern   Not on file  Social History Narrative   Not on file   Social Determinants of Health   Financial Resource Strain:    Difficulty of Paying Living Expenses: Not on file  Food Insecurity:    Worried About [place] in the Last Year: Not on file   Ran Out of Food in the Last Year: Not on file  Transportation Needs:    Lack of Transportation (Medical): Not on file   Lack of Transportation (Non-Medical): Not on file  Physical Activity:    Days of Exercise per Week: Not on file   Minutes of Exercise per Session: Not on file  Stress:    Feeling of Stress : Not on file  Social Connections:    Frequency of Communication with Friends and Family: Not on file   Frequency of Social Gatherings with Friends and Family: Not on file   Attends Religious Services: Not on file   Active Member of Clubs or Organizations: Not on file   Attends Archivist Meetings: Not on file   Marital Status: Not on file  Intimate Partner Violence:    Fear of Current or Ex-Partner: Not on file   Emotionally Abused: Not on file   Physically Abused: Not on file   Sexually Abused: Not on file   Family History  Problem Relation Age of Onset   Heart disease Mother    Kidney disease Father    Diabetes Maternal Grandfather    Diabetes Paternal Grandmother    Diabetes Paternal Grandfather    Colon cancer Neg Hx      OBJECTIVE:  Vitals:   08/31/20 1255  BP: (!) 193/82  Pulse: 78  Resp: 18  Temp: 98.4 F (36.9 C)  SpO2:  98%    General appearance: alert; no distress Eyes: PERRLA; EOMI; conjunctiva normal HENT: normocephalic; atraumatic Neck: supple Lungs: clear to auscultation bilaterally without adventitious breath sounds Heart: regular rate and rhythm.  Clear S1 and S2 without rubs, gallops, or murmur. Chest Wall: [place] no thrills Abdomen: soft, non-tender; bowel sounds normal; no masses or organomegaly; no guarding or rebound tenderness Extremities: no cyanosis or edema; symmetrical with no gross deformities Skin: warm and dry Psychological: alert and cooperative; normal mood and affect  ECG: Orders placed or performed during the hospital encounter of 03/23/20   ED EKG   ED EKG  EKG    EKG sinus rhythm with first-degree AV block without ST elevations, depressions, or prolonged PR interval .  No narrowing or widening of the QRS complexes.    LABS:  Results for orders placed or performed during the hospital encounter of 03/49/17  Basic metabolic panel  Result Value Ref Range   Sodium 139 135 - 145 mmol/L   Potassium 4.1 3.5 - 5.1 mmol/L   Chloride 104 98 - 111 mmol/L   CO2 28 22 - 32 mmol/L   Glucose, Bld 265 (H) 70 - 99 mg/dL   BUN 24 (H) 6 - 20 mg/dL   Creatinine, Ser 1.41 (H) 0.44 - 1.00 mg/dL   Calcium 9.0 8.9 - 10.3 mg/dL   GFR calc non Af Amer 43 (L) >60 mL/min   GFR calc Af Amer 50 (L) >60 mL/min   Anion gap 7 5 - 15  CBC  Result Value Ref Range   WBC 7.7 4.0 - 10.5 K/uL   RBC 3.66 (L) 3.87 - 5.11 MIL/uL   Hemoglobin 10.4 (L) 12.0 - 15.0 g/dL   HCT 32.1 (L) 36 - 46 %   MCV 87.7 80.0 - 100.0 fL   MCH 28.4 26.0 - 34.0 pg   MCHC 32.4 30.0 - 36.0 g/dL   RDW 13.6 11.5 - 15.5 %   Platelets 323 150 - 400 K/uL   nRBC 0.0 0.0 - 0.2 %  Troponin I (High Sensitivity)  Result Value Ref Range   Troponin I (High Sensitivity) 7 <18 ng/L   Labs Reviewed - No data to display  DIAGNOSTIC STUDIES:  No results found.   ASSESSMENT & PLAN:  1. Other chest pain   2. Encounter for  screening for COVID-19   3. First degree AV block   4. SOB (shortness of breath)     Meds ordered this encounter  Medications   albuterol (VENTOLIN HFA) 108 (90 Base) MCG/ACT inhaler    Sig: Inhale 1-2 puffs into the lungs every 6 (six) hours as needed for shortness of breath.    Dispense:  18 g    Refill:  0   benzonatate (TESSALON) 100 MG capsule    Sig: Take 1 capsule (100 mg total) by mouth every 8 (eight) hours.    Dispense:  30 capsule    Refill:  0    Patient history and exam consistent with non-cardiac cause of chest pain.  Discharge instructions   Chest pain precautions given. COVID-19 test was completed for rule out.  Test will take 2-7 days for results to return.  Someone will call if your result is positive ProAir was prescribed for SOB Tessalon Perles prescribed for cough Go to ER if you are having any new or worsening chest pain or pressure  Reviewed expectations re: course of current medical issues. Questions answered. Outlined signs and symptoms indicating need for more acute intervention. Patient verbalized understanding. After Visit Summary given.   Emerson Monte, [place] 08/31/20 1351

## 2020-08-31 NOTE — Discharge Instructions (Addendum)
Chest pain precautions given. COVID-19 test was completed for rule out.  Test will take 2-7 days for results to return.  Someone will call if your result is positive ProAir was prescribed for SOB Tessalon Perles prescribed for cough Follow-up with cardiologist regarding first-degree AV block Go to ER if you are having any new or worsening chest pain or pressure

## 2020-08-31 NOTE — ED Triage Notes (Signed)
Pt presents with c/o chest pressure, describes as if something is sitting on chest, began yesterday

## 2020-09-01 ENCOUNTER — Emergency Department (HOSPITAL_COMMUNITY): Payer: BC Managed Care – PPO

## 2020-09-01 ENCOUNTER — Encounter (HOSPITAL_COMMUNITY): Payer: Self-pay

## 2020-09-01 ENCOUNTER — Emergency Department (HOSPITAL_COMMUNITY)
Admission: EM | Admit: 2020-09-01 | Discharge: 2020-09-01 | Disposition: A | Discharge status: Home / Self Care | Payer: BC Managed Care – PPO | Attending: Emergency Medicine | Admitting: Emergency Medicine

## 2020-09-01 ENCOUNTER — Other Ambulatory Visit: Payer: Self-pay

## 2020-09-01 DIAGNOSIS — Z79899 Other long term (current) drug therapy: Secondary | ICD-10-CM | POA: Insufficient documentation

## 2020-09-01 DIAGNOSIS — I313 Pericardial effusion (noninflammatory): Secondary | ICD-10-CM

## 2020-09-01 DIAGNOSIS — Z7984 Long term (current) use of oral hypoglycemic drugs: Secondary | ICD-10-CM | POA: Diagnosis not present

## 2020-09-01 DIAGNOSIS — R0789 Other chest pain: Secondary | ICD-10-CM | POA: Diagnosis not present

## 2020-09-01 DIAGNOSIS — R0602 Shortness of breath: Secondary | ICD-10-CM | POA: Insufficient documentation

## 2020-09-01 DIAGNOSIS — R079 Chest pain, unspecified: Secondary | ICD-10-CM | POA: Diagnosis not present

## 2020-09-01 DIAGNOSIS — Z20822 Contact with and (suspected) exposure to covid-19: Secondary | ICD-10-CM | POA: Diagnosis not present

## 2020-09-01 DIAGNOSIS — E1140 Type 2 diabetes mellitus with diabetic neuropathy, unspecified: Secondary | ICD-10-CM | POA: Diagnosis not present

## 2020-09-01 DIAGNOSIS — J90 Pleural effusion, not elsewhere classified: Secondary | ICD-10-CM | POA: Diagnosis not present

## 2020-09-01 DIAGNOSIS — E1169 Type 2 diabetes mellitus with other specified complication: Secondary | ICD-10-CM | POA: Diagnosis not present

## 2020-09-01 DIAGNOSIS — E785 Hyperlipidemia, unspecified: Secondary | ICD-10-CM | POA: Diagnosis not present

## 2020-09-01 DIAGNOSIS — I517 Cardiomegaly: Secondary | ICD-10-CM | POA: Diagnosis not present

## 2020-09-01 DIAGNOSIS — I3139 Other pericardial effusion (noninflammatory): Secondary | ICD-10-CM

## 2020-09-01 DIAGNOSIS — R059 Cough, unspecified: Secondary | ICD-10-CM | POA: Diagnosis not present

## 2020-09-01 LAB — CBC WITH DIFFERENTIAL/PLATELET
Abs Immature Granulocytes: 0.03 10*3/uL (ref 0.00–0.07)
Basophils Absolute: 0 10*3/uL (ref 0.0–0.1)
Basophils Relative: 0 %
Eosinophils Absolute: 0.1 10*3/uL (ref 0.0–0.5)
Eosinophils Relative: 1 %
HCT: 35.9 % — ABNORMAL LOW (ref 36.0–46.0)
Hemoglobin: 11.7 g/dL — ABNORMAL LOW (ref 12.0–15.0)
Immature Granulocytes: 1 %
Lymphocytes Relative: 23 %
Lymphs Abs: 1.5 10*3/uL (ref 0.7–4.0)
MCH: 27.9 pg (ref 26.0–34.0)
MCHC: 32.6 g/dL (ref 30.0–36.0)
MCV: 85.5 fL (ref 80.0–100.0)
Monocytes Absolute: 0.4 10*3/uL (ref 0.1–1.0)
Monocytes Relative: 7 %
Neutro Abs: 4.2 10*3/uL (ref 1.7–7.7)
Neutrophils Relative %: 68 %
Platelets: 342 10*3/uL (ref 150–400)
RBC: 4.2 MIL/uL (ref 3.87–5.11)
RDW: 14 % (ref 11.5–15.5)
WBC: 6.2 10*3/uL (ref 4.0–10.5)
nRBC: 0 % (ref 0.0–0.2)

## 2020-09-01 LAB — D-DIMER, QUANTITATIVE: D-Dimer, Quant: 1.09 ug/mL-FEU — ABNORMAL HIGH (ref 0.00–0.50)

## 2020-09-01 LAB — BRAIN NATRIURETIC PEPTIDE: B Natriuretic Peptide: 200 pg/mL — ABNORMAL HIGH (ref 0.0–100.0)

## 2020-09-01 LAB — COMPREHENSIVE METABOLIC PANEL
ALT: 21 U/L (ref 0–44)
AST: 20 U/L (ref 15–41)
Albumin: 3.3 g/dL — ABNORMAL LOW (ref 3.5–5.0)
Alkaline Phosphatase: 56 U/L (ref 38–126)
Anion gap: 7 (ref 5–15)
BUN: 16 mg/dL (ref 6–20)
CO2: 28 mmol/L (ref 22–32)
Calcium: 9.2 mg/dL (ref 8.9–10.3)
Chloride: 104 mmol/L (ref 98–111)
Creatinine, Ser: 1.16 mg/dL — ABNORMAL HIGH (ref 0.44–1.00)
GFR, Estimated: 57 mL/min — ABNORMAL LOW (ref >60)
Glucose, Bld: 91 mg/dL (ref 70–99)
Potassium: 3.5 mmol/L (ref 3.5–5.1)
Sodium: 139 mmol/L (ref 135–145)
Total Bilirubin: 1.3 mg/dL — ABNORMAL HIGH (ref 0.3–1.2)
Total Protein: 7 g/dL (ref 6.5–8.1)

## 2020-09-01 LAB — SARS-COV-2, NAA 2 DAY TAT

## 2020-09-01 LAB — TROPONIN I (HIGH SENSITIVITY)
Troponin I (High Sensitivity): 17 ng/L (ref <18)
Troponin I (High Sensitivity): 17 ng/L (ref <18)
Troponin I (High Sensitivity): 18 ng/L — ABNORMAL HIGH (ref <18)

## 2020-09-01 LAB — RESP PANEL BY RT-PCR (FLU A&B, COVID) ARPGX2
Influenza A by PCR: NEGATIVE
Influenza B by PCR: NEGATIVE
SARS Coronavirus 2 by RT PCR: NEGATIVE

## 2020-09-01 LAB — NOVEL CORONAVIRUS, NAA: SARS-CoV-2, NAA: NOT DETECTED

## 2020-09-01 MED ORDER — LOSARTAN POTASSIUM 25 MG PO TABS
100.0000 mg | ORAL_TABLET | Freq: Once | ORAL | Status: AC
  Administered 2020-09-01: 100 mg via ORAL
  Filled 2020-09-01: qty 4

## 2020-09-01 MED ORDER — IPRATROPIUM-ALBUTEROL 0.5-2.5 (3) MG/3ML IN SOLN
3.0000 mL | Freq: Once | RESPIRATORY_TRACT | Status: AC
  Administered 2020-09-01: 3 mL via RESPIRATORY_TRACT
  Filled 2020-09-01: qty 3

## 2020-09-01 MED ORDER — NAPROXEN 375 MG PO TABS
375.0000 mg | ORAL_TABLET | Freq: Two times a day (BID) | ORAL | 0 refills | Status: DC
Start: 2020-09-01 — End: 2021-06-29

## 2020-09-01 MED ORDER — HYDROCHLOROTHIAZIDE 25 MG PO TABS
25.0000 mg | ORAL_TABLET | Freq: Every day | ORAL | Status: DC
  Administered 2020-09-01: 25 mg via ORAL
  Filled 2020-09-01: qty 1

## 2020-09-01 MED ORDER — IOHEXOL 350 MG/ML SOLN
100.0000 mL | Freq: Once | INTRAVENOUS | Status: AC | PRN
  Administered 2020-09-01: 100 mL via INTRAVENOUS

## 2020-09-01 NOTE — ED Triage Notes (Signed)
Pt reports cough and chest pressure x 3 days.  Went to urgent care yesterday.  Still having chest pressure today.

## 2020-09-01 NOTE — ED Provider Notes (Signed)
[facility] EMERGENCY DEPARTMENT Provider Note   CSN: [id number] Arrival date & time: 09/01/20  3662     History Chief Complaint  Patient presents with  . Chest Pain    Alexis Landry is a 49 y.o. female.  HPI 49 year old female with history of DM type II, GERD, hypertension, hyperlipidemia, diabetic neuropathy presents to the ER with several days of chest tightness.  She rates the pain a 5/10, however worse at night.  She states that the pain is in the substernal/central chest region.  She describes the sensation as "someone sitting on her chest".  She denies any fevers, however does endorse a dry cough and some wheezing.  She was seen at urgent care yesterday, was given albuterol and Tessalon Perles.  She states that these were not helpful.  She continues to have chest tightness.  Pain does not travel, not worse after eating.  No episodes of diaphoresis.No nausea of vomitting. No history of PE, no recent travel, surgeries, no oral estrogen. No prior history of smoking, asthma, or COPD.     Past Medical History:  Diagnosis Date  . Diabetic neuropathy ([place])   . DM (diabetes mellitus), type 2, uncontrolled ([place]) 02/24/2015  . Epiretinal membrane, right   . GERD (gastroesophageal reflux disease) 02/24/2015  . HTN (hypertension) 02/24/2015  . Hyperlipidemia 02/24/2015  . Macular edema, diabetic ([place])   . Rheumatoid arthritis ([place]) 02/24/2015  . Vitamin D deficiency disease 02/24/2015    Patient Active Problem List   Diagnosis Date Noted  . Encounter for screening colonoscopy 12/19/2016  . Constipation 12/19/2016  . Vitamin D deficiency disease 02/24/2015  . HTN (hypertension) 02/24/2015  . Hyperlipidemia 02/24/2015  . GERD (gastroesophageal reflux disease) 02/24/2015  . DM (diabetes mellitus), type 2, uncontrolled ([place]) 02/24/2015  . Rheumatoid arthritis ([place]) 02/24/2015    Past Surgical History:  Procedure Laterality Date  . COLONOSCOPY N/A 01/04/2017   Procedure:  COLONOSCOPY;  Surgeon: Danie Binder, MD;  [facility];  Service: Endoscopy;  Laterality: N/A;  10:30am  . KENALOG INJECTION Right 12/03/2018   Procedure: LUCENTIS 0.3MG  INJECTION;  Surgeon: Sherlynn Stalls, MD;  Location: [place];  Service: Ophthalmology;  Laterality: Right;  . MEMBRANE PEEL Right 12/03/2018   Procedure: MEMBRANE PEEL;  Surgeon: Sherlynn Stalls, MD;  Location: [place];  Service: Ophthalmology;  Laterality: Right;  . PARS PLANA VITRECTOMY Right 12/03/2018   Procedure: PARS PLANA VITRECTOMY WITH 25 GAUGE;  Surgeon: Sherlynn Stalls, MD;  Location: [place];  Service: Ophthalmology;  Laterality: Right;  . PHOTOCOAGULATION WITH LASER Right 12/03/2018   Procedure: PHOTOCOAGULATION WITH LASER;  Surgeon: Sherlynn Stalls, MD;  Location: [place];  Service: Ophthalmology;  Laterality: Right;  . TUBAL LIGATION       OB History    Gravida  5   Para  4   Term      Preterm      AB  1   Living  4     SAB      TAB      Ectopic      Multiple      Live Births              Family History  Problem Relation Age of Onset  . Heart disease Mother   . Kidney disease Father   . Diabetes Maternal Grandfather   . Diabetes Paternal Grandmother   . Diabetes Paternal Grandfather   . Colon cancer Neg Hx     Social History  Tobacco Use  . Smoking status: Never Smoker  . Smokeless tobacco: Never Used  Vaping Use  . Vaping Use: Never used  Substance Use Topics  . Alcohol use: Yes    Alcohol/week: 0.0 standard drinks    Comment: two times per month  . Drug use: No    Home Medications Prior to Admission medications   Medication Sig Start Date End Date Taking? Authorizing Provider  albuterol (VENTOLIN HFA) 108 (90 Base) MCG/ACT inhaler Inhale 1-2 puffs into the lungs every 6 (six) hours as needed for shortness of breath. 08/31/20   Avegno, Darrelyn Hillock, FNP  atorvastatin (LIPITOR) 10 MG tablet Take 10 mg by mouth every evening.     [provider]  benzonatate  (TESSALON) 100 MG capsule Take 1 capsule (100 mg total) by mouth every 8 (eight) hours. 08/31/20   Avegno, Darrelyn Hillock, FNP  BESIVANCE 0.6 % SUSP Place 1 drop into the right eye 3 (three) times daily. 09/07/19   [provider]  cloNIDine HCl (KAPVAY) 0.1 MG TB12 ER tablet Take 0.1 mg by mouth at bedtime.    [provider]  DUREZOL 0.05 % EMUL Place 1 drop into the right eye 3 (three) times daily. 10/03/19   [provider]  JANUVIA 100 MG tablet Take 100 mg by mouth daily. 05/17/18   [provider]  losartan-hydrochlorothiazide (HYZAAR) 100-25 MG tablet Take 1 tablet by mouth daily.    [provider]  meloxicam (MOBIC) 15 MG tablet Take 15 mg by mouth daily as needed for pain.    [provider]  metFORMIN (GLUCOPHAGE-XR) 500 MG 24 hr tablet Take 500 mg by mouth 2 (two) times daily.    [provider]  naproxen (NAPROSYN) 500 MG tablet Take 1 tablet (500 mg total) by mouth 2 (two) times daily. 07/25/19   Madilyn Hook A, PA-C  naproxen sodium (ALEVE) 220 MG tablet Take 440 mg by mouth daily as needed (pain).    [provider]  PROLENSA 0.07 % SOLN Place 1 drop into the right eye at bedtime. 09/03/19   [provider]  SYNJARDY 12.01-999 MG TABS Take by mouth. 08/19/20   [provider]  TRESIBA FLEXTOUCH 100 UNIT/ML SOPN FlexTouch Pen Inject 12 Units into the skin at bedtime.  05/08/18   [provider]    Allergies    Lisinopril and Sulfa antibiotics  Review of Systems   Review of Systems  Constitutional: Negative for chills and fever.  HENT: Negative for ear pain and sore throat.   Eyes: Negative for pain and visual disturbance.  Respiratory: Positive for cough and shortness of breath.   Cardiovascular: Positive for chest pain. Negative for palpitations and leg swelling.  Gastrointestinal: Negative for abdominal pain and vomiting.  Genitourinary: Negative for dysuria and hematuria.    Musculoskeletal: Negative for arthralgias and back pain.  Skin: Negative for color change and rash.  Neurological: Negative for seizures and syncope.  All other systems reviewed and are negative.   Physical Exam Updated Vital Signs BP (!) 157/82   Pulse 74   Temp (!) 97.4 F (36.3 C) (Oral)   Resp 18   Ht 5\' 6"  (1.676 m)   Wt 104.3 kg   LMP 09/19/2016 (Approximate)   SpO2 100%   BMI 37.12 kg/m   Physical Exam Vitals and nursing note reviewed.  Constitutional:      General: She is not in acute distress.    Appearance: She is well-developed. She is  not ill-appearing, toxic-appearing or diaphoretic.  HENT:     Head: Normocephalic and atraumatic.  Eyes:     Conjunctiva/sclera: Conjunctivae normal.  Cardiovascular:     Rate and Rhythm: Normal rate and regular rhythm.     Pulses:          Radial pulses are 2+ on the right side and 2+ on the left side.     Heart sounds: Normal heart sounds. No murmur heard.   Pulmonary:     Effort: Pulmonary effort is normal. No respiratory distress.     Breath sounds: Normal breath sounds. No decreased breath sounds, wheezing, rhonchi or rales.  Chest:     Chest wall: No tenderness.  Abdominal:     Palpations: Abdomen is soft.     Tenderness: There is no abdominal tenderness.  Musculoskeletal:     Cervical back: Neck supple.     Right lower leg: No tenderness. No edema.     Left lower leg: No tenderness. No edema.  Skin:    General: Skin is warm and dry.     Findings: No erythema.  Neurological:     General: No focal deficit present.     Mental Status: She is alert.  Psychiatric:        Mood and Affect: Mood normal.        Behavior: Behavior normal.      ED Results / Procedures / Treatments   Labs (all labs ordered are listed, but only abnormal results are displayed) Labs Reviewed  CBC WITH DIFFERENTIAL/PLATELET - Abnormal; Notable for the following components:      Result Value   Hemoglobin 11.7 (*)    HCT 35.9 (*)     All other components within normal limits  COMPREHENSIVE METABOLIC PANEL - Abnormal; Notable for the following components:   Creatinine, Ser 1.16 (*)    Albumin 3.3 (*)    Total Bilirubin 1.3 (*)    GFR, Estimated 57 (*)    All other components within normal limits  D-DIMER, QUANTITATIVE (NOT AT [facility]) - Abnormal; Notable for the following components:   D-Dimer, Quant 1.09 (*)    All other components within normal limits  RESP PANEL BY RT-PCR (FLU A&B, COVID) ARPGX2  BRAIN NATRIURETIC PEPTIDE  TROPONIN I (HIGH SENSITIVITY)  TROPONIN I (HIGH SENSITIVITY)  TROPONIN I (HIGH SENSITIVITY)    EKG EKG Interpretation  Date/Time:  Tuesday September 01 2020 09:32:33 EST Ventricular Rate:  78 PR Interval:  202 QRS Duration: 76 QT Interval:  406 QTC Calculation: 462 R Axis:   98 Text Interpretation: Normal sinus rhythm Rightward axis Cannot rule out Anterior infarct , age undetermined Abnormal ECG Confirmed by Gerlene Fee [phone]) on 09/01/2020 2:57:11 PM   Radiology DG Chest 2 View  Result Date: 09/01/2020 CLINICAL DATA:  Chest pain. EXAM: CHEST - 2 VIEW COMPARISON:  June 21, 21. FINDINGS: The heart size and mediastinal contours are within normal limits. Low lung volumes. No consolidation. No visible pleural effusions or pneumothorax. The visualized skeletal structures are unremarkable. IMPRESSION: No acute cardiopulmonary disease. Electronically Signed   By: Margaretha Sheffield MD   On: 09/01/2020 10:38   CT Angio Chest PE W and/or Wo Contrast  Result Date: 09/01/2020 CLINICAL DATA:  Cough and chest pressure for 3 days, positive D dimer EXAM: CT ANGIOGRAPHY CHEST WITH CONTRAST TECHNIQUE: Multidetector CT imaging of the chest was performed using the standard protocol during bolus administration of intravenous contrast. Multiplanar CT image reconstructions and MIPs were obtained to  evaluate the vascular anatomy. CONTRAST:  18mL OMNIPAQUE IOHEXOL 350 MG/ML SOLN COMPARISON:  09/01/2020  FINDINGS: Cardiovascular: This is a technically adequate evaluation of the pulmonary vasculature. There are no filling defects or pulmonary emboli. The heart is mildly enlarged with trace pericardial effusion. No thoracic aortic aneurysm or dissection. Mediastinum/Nodes: No enlarged mediastinal, hilar, or axillary lymph nodes. Thyroid gland, trachea, and esophagus demonstrate no significant findings. Lungs/Pleura: There are small bilateral pleural effusions volume estimated less than several hundred ccs each. No airspace disease or pneumothorax. Central airways are patent. Upper Abdomen: No acute abnormality. Musculoskeletal: No acute or destructive bony lesions. Reconstructed images demonstrate no additional findings. Review of the MIP images confirms the above findings. IMPRESSION: 1. Cardiomegaly, with trace pericardial effusion. 2. Small bilateral pleural effusions. 3. No evidence of pulmonary embolus. Electronically Signed   By: Randa Ngo M.D.   On: 09/01/2020 18:04    Procedures Procedures (including critical care time)  Medications Ordered in ED Medications  hydrochlorothiazide (HYDRODIURIL) tablet 25 mg (25 mg Oral Given 09/01/20 1559)  losartan (COZAAR) tablet 100 mg (100 mg Oral Given 09/01/20 1557)  iohexol (OMNIPAQUE) 350 MG/ML injection 100 mL (100 mLs Intravenous Contrast Given 09/01/20 1715)  ipratropium-albuterol (DUONEB) 0.5-2.5 (3) MG/3ML nebulizer solution 3 mL (3 mLs Nebulization Given 09/01/20 1824)    ED Course  I have reviewed the triage vital signs and the nursing notes.  Pertinent labs & imaging results that were available during my care of the patient were reviewed by me and considered in my medical decision making (see chart for details).    MDM Rules/Calculators/A&P                          49 year old female with several days of chest tightness, shortness of breath. On presentation, she is alert, oriented, nontoxic-appearing, no acute distress, resting  comfortably in the ER bed.  Speaking full sentences without increased work of breathing.  Was on arrival with a blood pressure of 200/91, patient reports that she has not taken her morning blood pressure medicines.  Afebrile, not tachycardic, tachypneic or hypoxic.  DDx includes ACS, PE, Covid, viral URI, pneumonia  Labs reviewed and interpreted by me -CBC without leukocytosis, hemoglobin 11.7 -CMP without any significant electrode abnormalities, creatinine of 1.16 which appears to be improved from previous.   -Initial troponin of 17. -D-dimer here is elevated at 1.09 -Covid test is pending  Plain films reviewed by myself and radiology -Chest x-ray without evidence of pneumonia or any intrathoracic process  EKG reviewed by myself and my supervising physician -No evidence of ischemia  DM: Given elevated D-dimer, CT PE study was ordered.  Patient was given her home losartan and HCTZ.  Blood pressure improved.  CT PE study with cardiomegaly with trace pericardial pleural effusion.  She also has small bilateral pleural effusions.  No evidence of PE.  Repeat troponin and BNP pending.  Patient has no prior history of heart failure.  Vitals overall reassuring, she is not tachycardic.  Suspect that if her second troponin and BNP is normal she will be stable with follow-up with cardiology.  Potentially could be a viral source.  Signed out care to [facility]. He will oversee her troponin and BNP if these are within normal limits, suspect she will be stable w/ cardiology followup.    Final Clinical Impression(s) / ED Diagnoses Final diagnoses:  Pericardial effusion  Pleural effusion  Chest tightness    Rx / DC Orders  ED Discharge Orders    None       Lyndel Safe 09/01/20 Lewis Moccasin, MD 09/01/20 2209

## 2020-09-01 NOTE — Discharge Instructions (Addendum)
You were evaluated in the Emergency Department and after careful evaluation, we did not find any emergent condition requiring admission or further testing in the hospital.  Your exam/testing today is overall reassuring.  Your symptoms may be due to pericarditis, or inflammation around the heart.  We recommend taking the Naprosyn medication twice daily as prescribed and following up closely with your regular doctor.  Please return to the Emergency Department if you experience any worsening of your condition.   Thank you for allowing [place] to be a part of your care.

## 2020-09-03 DIAGNOSIS — R0789 Other chest pain: Secondary | ICD-10-CM | POA: Diagnosis not present

## 2020-09-03 DIAGNOSIS — K219 Gastro-esophageal reflux disease without esophagitis: Secondary | ICD-10-CM | POA: Diagnosis not present

## 2020-09-03 DIAGNOSIS — I10 Essential (primary) hypertension: Secondary | ICD-10-CM | POA: Diagnosis not present

## 2020-09-03 DIAGNOSIS — J984 Other disorders of lung: Secondary | ICD-10-CM | POA: Diagnosis not present

## 2020-09-03 DIAGNOSIS — Z6834 Body mass index (BMI) 34.0-34.9, adult: Secondary | ICD-10-CM | POA: Diagnosis not present

## 2020-09-23 DIAGNOSIS — E113513 Type 2 diabetes mellitus with proliferative diabetic retinopathy with macular edema, bilateral: Secondary | ICD-10-CM | POA: Diagnosis not present

## 2020-10-21 DIAGNOSIS — E113513 Type 2 diabetes mellitus with proliferative diabetic retinopathy with macular edema, bilateral: Secondary | ICD-10-CM | POA: Diagnosis not present

## 2020-10-23 ENCOUNTER — Encounter: Payer: Self-pay | Admitting: Internal Medicine

## 2020-10-23 ENCOUNTER — Other Ambulatory Visit: Payer: Self-pay

## 2020-10-23 ENCOUNTER — Ambulatory Visit (INDEPENDENT_AMBULATORY_CARE_PROVIDER_SITE_OTHER): Payer: BC Managed Care – PPO | Admitting: Internal Medicine

## 2020-10-23 VITALS — BP 154/90 | HR 76 | Ht 66.0 in | Wt 227.0 lb

## 2020-10-23 DIAGNOSIS — R011 Cardiac murmur, unspecified: Secondary | ICD-10-CM

## 2020-10-23 DIAGNOSIS — R0602 Shortness of breath: Secondary | ICD-10-CM | POA: Diagnosis not present

## 2020-10-23 DIAGNOSIS — I10 Essential (primary) hypertension: Secondary | ICD-10-CM

## 2020-10-23 MED ORDER — AMLODIPINE BESYLATE 5 MG PO TABS: 5.0000 mg | ORAL_TABLET | Freq: Every day | ORAL | 3 refills | Status: DC

## 2020-10-23 NOTE — Progress Notes (Signed)
Cardiology Office Note   Date:  10/23/2020   ID:  Alexis Landry, DOB 1970-10-08, MRN [id number]  PCP:  Alexis Samples, PA-C  Cardiologist:   [name], MD   Pt is a 50 yo who was referred from [facility] for evaluation of chrst pressure   History of Present Illness: Alexis Landry is a 50 y.o. female with a history of HTN, HL, DM who is followed at [place] in Dec 2021  COmplained of chest pressure   [name] had an episode of  Chest pressurel on 08/31/20  Chest was heavy  [facility] says it wouldn't  stop   Felt like someone sitting on chest   Repeated  Not pleuritc   Couldn't walk up stps  OUt of breatth   She was seen in ER  BP elevateid to 200/  Had not taken morning meds   Trop neg  D Dimer 1.09   CTneg for PE  Small pericardial effusion noted   BNP 200 Consideration for pericarditis   Rx naprosyn and lasix   Since ER visit she has had no further CP   Breathing is OK          Current Meds  Medication Sig   albuterol (VENTOLIN HFA) 108 (90 Base) MCG/ACT inhaler Inhale 1-2 puffs into the lungs every 6 (six) hours as needed for shortness of breath.   atorvastatin (LIPITOR) 10 MG tablet Take 10 mg by mouth every evening.    BESIVANCE 0.6 % SUSP Place 1 drop into the right eye 3 (three) times daily.   cloNIDine HCl (KAPVAY) 0.1 MG TB12 ER tablet Take 0.1 mg by mouth at bedtime.   DUREZOL 0.05 % EMUL Place 1 drop into the right eye 3 (three) times daily.   furosemide (LASIX) 20 MG tablet Take 20 mg by mouth daily.   losartan-hydrochlorothiazide (HYZAAR) 100-25 MG tablet Take 1 tablet by mouth daily.   meloxicam (MOBIC) 15 MG tablet Take 15 mg by mouth daily as needed for pain.   naproxen (NAPROSYN) 375 MG tablet Take 1 tablet (375 mg total) by mouth 2 (two) times daily.   ofloxacin (OCUFLOX) 0.3 % ophthalmic solution Place 1 drop into both eyes every 4 (four) hours.   omeprazole (PRILOSEC) 20 MG capsule Take 20 mg by mouth daily.   PROLENSA  0.07 % SOLN Place 1 drop into the right eye at bedtime.   SOLIQUA 100-33 UNT-MCG/ML SOPN Inject 40 Units into the skin in the morning.   SYNJARDY 12.01-999 MG TABS Take 1 tablet by mouth in the morning and at bedtime.    triamcinolone (KENALOG) 0.1 % Apply 1 application topically See admin instructions. 1 application 2 to 3 times daily     Allergies:   Lisinopril and Sulfa antibiotics   Past Medical History:  Diagnosis Date   Diabetic neuropathy ([place])    DM (diabetes mellitus), type 2, uncontrolled ([place]) 02/24/2015   Epiretinal membrane, right    GERD (gastroesophageal reflux disease) 02/24/2015   HTN (hypertension) 02/24/2015   Hyperlipidemia 02/24/2015   Macular edema, diabetic ([place])    Rheumatoid arthritis ([place]) 02/24/2015   Vitamin D deficiency disease 02/24/2015    Past Surgical History:  Procedure Laterality Date   COLONOSCOPY N/A 01/04/2017   Procedure: COLONOSCOPY;  Surgeon: Danie Binder, MD;  [facility];  Service: Endoscopy;  Laterality: N/A;  10:30am   KENALOG INJECTION Right 12/03/2018   Procedure: LUCENTIS 0.3MG  INJECTION;  Surgeon:  Sherlynn Stalls, MD;  Location: [place];  Service: Ophthalmology;  Laterality: Right;   MEMBRANE PEEL Right 12/03/2018   Procedure: MEMBRANE PEEL;  Surgeon: Sherlynn Stalls, MD;  Location: [place];  Service: Ophthalmology;  Laterality: Right;   PARS PLANA VITRECTOMY Right 12/03/2018   Procedure: PARS PLANA VITRECTOMY WITH 25 GAUGE;  Surgeon: Sherlynn Stalls, MD;  Location: [place];  Service: Ophthalmology;  Laterality: Right;   PHOTOCOAGULATION WITH LASER Right 12/03/2018   Procedure: PHOTOCOAGULATION WITH LASER;  Surgeon: Sherlynn Stalls, MD;  Location: [place];  Service: Ophthalmology;  Laterality: Right;   TUBAL LIGATION       Social History:  The patient  reports that she has never smoked. She has never used smokeless tobacco. She reports current alcohol use. She reports that she does not use drugs.   Family History:  The  patient's family history includes Diabetes in her maternal grandfather, paternal grandfather, and paternal grandmother; Heart disease in her mother; Kidney disease in her father.    ROS:  Please see the history of present illness. All other systems are reviewed and  Negative to the above problem except as noted.    PHYSICAL EXAM: VS:  BP (!) 154/90    Pulse 76    Ht 5\' 6"  (1.676 m)    Wt 227 lb (103 kg)    LMP 09/19/2016 (Approximate)    SpO2 98%    BMI 36.64 kg/m   GEN: Morbidly obese 50 yo in no acute distress  HEENT: normal  Neck: no JVD, Murmur L neck   Cardiac: RRR; no murmurs  No LE  edema  Respiratory:  clear to auscultation bilaterally, GI: soft, nontender, nondistended, + BS  No hepatomegaly  MS: no deformity Moving all extremities   Skin: warm and dry, no rash Neuro:  Strength and sensation are intact Psych: euthymic mood, full affect   EKG:  EKG is ordered today.   Lipid Panel No results found for: CHOL, TRIG, HDL, CHOLHDL, VLDL, LDLCALC, LDLDIRECT    Wt Readings from Last 3 Encounters:  10/23/20 227 lb (103 kg)  09/01/20 230 lb (104.3 kg)  11/12/19 222 lb (100.7 kg)      ASSESSMENT AND PLAN:  1  Chest pressure    Hx does not sound like coronary ischemiia or pericarditis  ? If pressure related to diastolic dysfunction in setting of severe HTN     Keep on current regimen including lasix   Set up for echo to eval systolic and diastolic function      2  HTN  BP is still high today   Would add amlodipine 5 mg to regimen    3  Neck murmur   Check carotid USN    4 HL  Stressed importance of lipitor   Will need to follow lipids in future when she has been taking regularly   5 DM  Has had for over 20 years   Discussed diet      Current medicines are reviewed at length with the patient today.  The patient does not have concerns regarding medicines.  Signed, [name], MD  10/23/2020 10:43 AM    [address],  [place], [place]  [address] Phone: [phone]; Fax: [phone]

## 2020-10-23 NOTE — Patient Instructions (Addendum)
Medication Instructions:  Your physician has recommended you make the following change in your medication:  1.) amlodipine 5 mg - one tablet by mouth daily  *If you need a refill on your cardiac medications before your next appointment, please call your pharmacy*   Lab Work: BMET, BNP  If you have labs (blood work) drawn today and your tests are completely normal, you will receive your results only by: Mar[place] Kitchen MyChart Message (if you have MyChart) OR . A paper copy in the mail If you have any lab test that is abnormal or we need to change your treatment, we will call you to review the results.   Testing/Procedures:  [place] physician has requested that you have an echocardiogram. Echocardiography is a painless test that uses sound waves to create images of your heart. It provides your doctor with information about the size and shape of your heart and how well your heart's chambers and valves are working. This procedure takes approximately one hour. There are no restrictions for this procedure. EDEN Your physician has requested that you have a carotid duplex. This test is an ultrasound of the carotid arteries in your neck. It looks at blood flow through these arteries that supply the brain with blood. Allow one hour for this exam. There are no restrictions or special instructions.   Follow-Up: At [facility], you and your health needs are our priority.  As part of our continuing mission to provide you with exceptional heart care, we have created designated Provider Care Teams.  These Care Teams include your primary Cardiologist (physician) and Advanced Practice Providers (APPs -  Physician Assistants and Nurse Practitioners) who all work together to provide you with the care you need, when you need it.  Your next appointment:   4-6 week(s)  The format for your next appointment:   In Person in [place]  Provider:   You may see [name], MD or one of the following Advanced  Practice Providers on your designated Care Team:    Bernerd Pho, PA-C   Ermalinda Barrios, [place]    Other Instructions

## 2020-10-25 LAB — BASIC METABOLIC PANEL
BUN/Creatinine Ratio: 18 (ref 9–23)
BUN: 23 mg/dL (ref 6–24)
CO2: 23 mmol/L (ref 20–29)
Calcium: 9.6 mg/dL (ref 8.7–10.2)
Chloride: 104 mmol/L (ref 96–106)
Creatinine, Ser: 1.29 mg/dL — ABNORMAL HIGH (ref 0.57–1.00)
GFR calc Af Amer: 55 mL/min/{1.73_m2} — ABNORMAL LOW (ref >59)
GFR calc non Af Amer: 48 mL/min/{1.73_m2} — ABNORMAL LOW (ref >59)
Glucose: 199 mg/dL — ABNORMAL HIGH (ref 65–99)
Potassium: 4.3 mmol/L (ref 3.5–5.2)
Sodium: 140 mmol/L (ref 134–144)

## 2020-10-25 LAB — PRO B NATRIURETIC PEPTIDE: NT-Pro BNP: 353 pg/mL — ABNORMAL HIGH (ref 0–249)

## 2020-10-27 ENCOUNTER — Other Ambulatory Visit: Payer: Self-pay | Admitting: Internal Medicine

## 2020-10-27 DIAGNOSIS — R0602 Shortness of breath: Secondary | ICD-10-CM

## 2020-10-27 DIAGNOSIS — R011 Cardiac murmur, unspecified: Secondary | ICD-10-CM

## 2020-10-27 DIAGNOSIS — R0989 Other specified symptoms and signs involving the circulatory and respiratory systems: Secondary | ICD-10-CM

## 2020-10-30 DIAGNOSIS — E7849 Other hyperlipidemia: Secondary | ICD-10-CM | POA: Diagnosis not present

## 2020-10-30 DIAGNOSIS — E1165 Type 2 diabetes mellitus with hyperglycemia: Secondary | ICD-10-CM | POA: Diagnosis not present

## 2020-10-30 DIAGNOSIS — Z1331 Encounter for screening for depression: Secondary | ICD-10-CM | POA: Diagnosis not present

## 2020-10-30 DIAGNOSIS — I10 Essential (primary) hypertension: Secondary | ICD-10-CM | POA: Diagnosis not present

## 2020-10-30 DIAGNOSIS — E118 Type 2 diabetes mellitus with unspecified complications: Secondary | ICD-10-CM | POA: Diagnosis not present

## 2020-10-30 DIAGNOSIS — Z6834 Body mass index (BMI) 34.0-34.9, adult: Secondary | ICD-10-CM | POA: Diagnosis not present

## 2020-11-10 ENCOUNTER — Ambulatory Visit (INDEPENDENT_AMBULATORY_CARE_PROVIDER_SITE_OTHER): Payer: BC Managed Care – PPO

## 2020-11-10 DIAGNOSIS — R0989 Other specified symptoms and signs involving the circulatory and respiratory systems: Secondary | ICD-10-CM | POA: Diagnosis not present

## 2020-11-10 DIAGNOSIS — R011 Cardiac murmur, unspecified: Secondary | ICD-10-CM

## 2020-11-10 DIAGNOSIS — R0602 Shortness of breath: Secondary | ICD-10-CM

## 2020-11-10 LAB — ECHOCARDIOGRAM COMPLETE
Area-P 1/2: 3.93 cm2
Calc EF: 59.4 %
MV M vel: 4.44 m/s
MV Peak grad: 78.9 mmHg
S' Lateral: 2.29 cm
Single Plane A2C EF: 58.6 %
Single Plane A4C EF: 61.9 %

## 2020-11-18 DIAGNOSIS — E113513 Type 2 diabetes mellitus with proliferative diabetic retinopathy with macular edema, bilateral: Secondary | ICD-10-CM | POA: Diagnosis not present

## 2020-11-18 DIAGNOSIS — H26492 Other secondary cataract, left eye: Secondary | ICD-10-CM | POA: Diagnosis not present

## 2020-12-10 DIAGNOSIS — H02831 Dermatochalasis of right upper eyelid: Secondary | ICD-10-CM | POA: Diagnosis not present

## 2020-12-10 DIAGNOSIS — Z961 Presence of intraocular lens: Secondary | ICD-10-CM | POA: Diagnosis not present

## 2020-12-10 DIAGNOSIS — E113393 Type 2 diabetes mellitus with moderate nonproliferative diabetic retinopathy without macular edema, bilateral: Secondary | ICD-10-CM | POA: Diagnosis not present

## 2020-12-10 DIAGNOSIS — H18413 Arcus senilis, bilateral: Secondary | ICD-10-CM | POA: Diagnosis not present

## 2020-12-10 DIAGNOSIS — Z794 Long term (current) use of insulin: Secondary | ICD-10-CM | POA: Diagnosis not present

## 2020-12-15 NOTE — Progress Notes (Unsigned)
Cardiology Office Note   Date:  12/16/2020   ID:  Alexis Landry, DOB 10-Feb-1971, MRN [id number]  PCP:  [name], PA-C  Cardiologist:   [name], MD   Pt is a 50 yo who was referred from [facility] for evaluation of chrst pressure   History of Present Illness: Alexis Landry is a 50 y.o. female with a history of HTN, HL, DM who is followed at [facility]  She also has a hx of chest pressure / heaviness   Ween in ED in the fall  BP elevated   Trop neg  D dimer mildly elevated but CT neg for PE   Small pericardial effusion noted   She was placed on naprosyn and lasix   I saw her in Jan 2022   I did not think it sounded like pericarditis or ischemia   Set pt up for echo   Also set up for USN of carotids  BP was elevated and I added amlodpine    Echo was done on  11/10/20  OVerall Normal   I recomm watching salt     Carotid USN (done for murmur) was also normal   Since seen she denies CP   Still gets SOB with walking    Is not too active  DIet:     Breakfast  Mostly gets a biscuit Coffee Lunch:  May skip lunch  Doesn/t eat much   Dinner  Eats out a lot     Drinks water and Zero water      Current Meds  Medication Sig  . albuterol (VENTOLIN HFA) 108 (90 Base) MCG/ACT inhaler Inhale 1-2 puffs into the lungs every 6 (six) hours as needed for shortness of breath.  Marland Kitchen amLODipine (NORVASC) 5 MG tablet Take 1 tablet (5 mg total) by mouth daily.  Marland Kitchen atorvastatin (LIPITOR) 10 MG tablet Take 10 mg by mouth every evening.   Marland Kitchen BESIVANCE 0.6 % SUSP Place 1 drop into the right eye 3 (three) times daily.  . cloNIDine HCl (KAPVAY) 0.1 MG TB12 ER tablet Take 0.1 mg by mouth at bedtime.  . DUREZOL 0.05 % EMUL Place 1 drop into the right eye 3 (three) times daily.  . furosemide (LASIX) 20 MG tablet Take 20 mg by mouth daily.  Marland Kitchen losartan-hydrochlorothiazide (HYZAAR) 100-25 MG tablet Take 1 tablet by mouth daily.  . meloxicam (MOBIC) 15 MG tablet Take 15 mg by mouth daily as  needed for pain.  Marland Kitchen ofloxacin (OCUFLOX) 0.3 % ophthalmic solution Place 1 drop into both eyes every 4 (four) hours.  Marland Kitchen omeprazole (PRILOSEC) 20 MG capsule Take 20 mg by mouth daily.  Marland Kitchen PROLENSA 0.07 % SOLN Place 1 drop into the right eye at bedtime.  Willeen Niece 100-33 UNT-MCG/ML SOPN Inject 40 Units into the skin in the morning.  . triamcinolone (KENALOG) 0.1 % Apply 1 application topically See admin instructions. 1 application 2 to 3 times daily     Allergies:   Lisinopril and Sulfa antibiotics   Past Medical History:  Diagnosis Date  . Diabetic neuropathy ([place])   . DM (diabetes mellitus), type 2, uncontrolled ([place]) 02/24/2015  . Epiretinal membrane, right   . GERD (gastroesophageal reflux disease) 02/24/2015  . HTN (hypertension) 02/24/2015  . Hyperlipidemia 02/24/2015  . Macular edema, diabetic ([place])   . Rheumatoid arthritis ([place]) 02/24/2015  . Vitamin D deficiency disease 02/24/2015    Past Surgical History:  Procedure Laterality Date  . COLONOSCOPY N/A 01/04/2017  Procedure: COLONOSCOPY;  Surgeon: Danie Binder, MD;  [facility];  Service: Endoscopy;  Laterality: N/A;  10:30am  . KENALOG INJECTION Right 12/03/2018   Procedure: LUCENTIS 0.3MG  INJECTION;  Surgeon: Sherlynn Stalls, MD;  Location: [address];  Service: Ophthalmology;  Laterality: Right;  . MEMBRANE PEEL Right 12/03/2018   Procedure: MEMBRANE PEEL;  Surgeon: Sherlynn Stalls, MD;  Location: [place];  Service: Ophthalmology;  Laterality: Right;  . PARS PLANA VITRECTOMY Right 12/03/2018   Procedure: PARS PLANA VITRECTOMY WITH 25 GAUGE;  Surgeon: Sherlynn Stalls, MD;  Location: [place];  Service: Ophthalmology;  Laterality: Right;  . PHOTOCOAGULATION WITH LASER Right 12/03/2018   Procedure: PHOTOCOAGULATION WITH LASER;  Surgeon: Sherlynn Stalls, MD;  Location: [place];  Service: Ophthalmology;  Laterality: Right;  . TUBAL LIGATION       Social History:  The patient  reports that she has never smoked. She has never used smokeless  tobacco. She reports current alcohol use. She reports that she does not use drugs.   Family History:  The patient's family history includes Diabetes in her maternal grandfather, paternal grandfather, and paternal grandmother; Heart disease in her mother; Kidney disease in her father.    ROS:  Please see the history of present illness. All other systems are reviewed and  Negative to the above problem except as noted.    PHYSICAL EXAM: VS:  BP 140/78   Pulse 76   Ht 5\' 6"  (1.676 m)   Wt 235 lb (106.6 kg)   LMP 09/19/2016 (Approximate)   SpO2 96%   BMI 37.93 kg/m   GEN: Morbidly obese 50 yo in no acute distress  HEENT: normal  Neck: no JVD, Cardiac: RRR; no murmurs  No LE  edema  Respiratory:  clear to auscultation bilaterally, GI: soft, nontender, nondistended, + BS  No hepatomegaly  MS: no deformity Moving all extremities   Skin: warm and dry, no rash Neuro:  Strength and sensation are intact Psych: euthymic mood, full affect   EKG:  EKG is not ordered today.   Echo  11/10/20  1. Left ventricular ejection fraction, by estimation, is 65 to 70%. The left ventricle has normal function. The left ventricle has no regional wall motion abnormalities. There is mild left ventricular hypertrophy. Left ventricular diastolic parameters are indeterminate. Global longitudinal strain measurement low normal at -16.4% and does not track myocardium completely, suspect higher actual measurment. 2. Right ventricular systolic function is normal. The right ventricular size is normal. There is normal pulmonary artery systolic pressure. The estimated right ventricular systolic pressure is 17.6 mmHg. 3. Left atrial size was upper normal. 4. There is a trivial pericardial effusion that is circumferential. 5. The mitral valve is grossly normal. Mild mitral valve regurgitation. 6. The aortic valve is tricuspid. Aortic valve regurgitation is not visualized. 7. The inferior vena cava is normal in size  with greater than 50% respiratory variability, suggesting right atrial pressure of 3 mmHg.  11/10/20  Carotid USN  Normal     Lipid Panel No results found for: CHOL, TRIG, HDL, CHOLHDL, VLDL, LDLCALC, LDLDIRECT    Wt Readings from Last 3 Encounters:  12/16/20 235 lb (106.6 kg)  10/23/20 227 lb (103 kg)  09/01/20 230 lb (104.3 kg)      ASSESSMENT AND PLAN:  1  Hx of Chest pressure    Pt currently denies        2   DOE   I think this most like represents deconditioning and obesity  Echo does not show signif diastolic dysfunction Need to keep watch on BP   Not optimal,but I will not make changes     2  HTN  BP is Better  Keep on current regimen  May need to titrate     3  Neck murmur   Carotid USN was normal     4 HL  Keep on lipitor    5 Obesity   DIscussed diet, limit carbs   COnsider TRE      Plan for f/u in November      Current medicines are reviewed at length with the patient today.  The patient does not have concerns regarding medicines.  Signed, [name], MD  12/16/2020 2:03 PM    [address], [place], [place]  [address] Phone: [phone]; Fax: [phone]

## 2020-12-16 ENCOUNTER — Other Ambulatory Visit: Payer: Self-pay

## 2020-12-16 ENCOUNTER — Other Ambulatory Visit (HOSPITAL_COMMUNITY)
Admission: RE | Admit: 2020-12-16 | Discharge: 2020-12-16 | Disposition: A | Discharge status: Home / Self Care | Payer: BC Managed Care – PPO | Source: Ambulatory Visit | Attending: Internal Medicine | Admitting: Internal Medicine

## 2020-12-16 ENCOUNTER — Encounter: Payer: Self-pay | Admitting: Internal Medicine

## 2020-12-16 ENCOUNTER — Ambulatory Visit (INDEPENDENT_AMBULATORY_CARE_PROVIDER_SITE_OTHER): Payer: BC Managed Care – PPO | Admitting: Internal Medicine

## 2020-12-16 VITALS — BP 140/78 | HR 76 | Ht 66.0 in | Wt 235.0 lb

## 2020-12-16 DIAGNOSIS — E113513 Type 2 diabetes mellitus with proliferative diabetic retinopathy with macular edema, bilateral: Secondary | ICD-10-CM | POA: Diagnosis not present

## 2020-12-16 DIAGNOSIS — H26492 Other secondary cataract, left eye: Secondary | ICD-10-CM | POA: Diagnosis not present

## 2020-12-16 DIAGNOSIS — Z79899 Other long term (current) drug therapy: Secondary | ICD-10-CM

## 2020-12-16 LAB — BASIC METABOLIC PANEL
Anion gap: 9 (ref 5–15)
BUN: 22 mg/dL — ABNORMAL HIGH (ref 6–20)
CO2: 28 mmol/L (ref 22–32)
Calcium: 8.4 mg/dL — ABNORMAL LOW (ref 8.9–10.3)
Chloride: 105 mmol/L (ref 98–111)
Creatinine, Ser: 1.34 mg/dL — ABNORMAL HIGH (ref 0.44–1.00)
GFR, Estimated: 48 mL/min — ABNORMAL LOW (ref >60)
Glucose, Bld: 382 mg/dL — ABNORMAL HIGH (ref 70–99)
Potassium: 3.9 mmol/L (ref 3.5–5.1)
Sodium: 142 mmol/L (ref 135–145)

## 2020-12-16 NOTE — Patient Instructions (Signed)
Medication Instructions:  Your physician recommends that you continue on your current medications as directed. Please refer to the Current Medication list given to you today.  *If you need a refill on your cardiac medications before your next appointment, please call your pharmacy*   Lab Work: Your physician recommends that you return for lab work in: BMET   If you have labs (blood work) drawn today and your tests are completely normal, you will receive your results only by: Marland Kitchen MyChart Message (if you have MyChart) OR . A paper copy in the mail If you have any lab test that is abnormal or we need to change your treatment, we will call you to review the results.   Testing/Procedures: NONE    Follow-Up: At [facility], you and your health needs are our priority.  As part of our continuing mission to provide you with exceptional heart care, we have created designated Provider Care Teams.  These Care Teams include your primary Cardiologist (physician) and Advanced Practice Providers (APPs -  Physician Assistants and Nurse Practitioners) who all work together to provide you with the care you need, when you need it.  We recommend signing up for the patient portal called "MyChart".  Sign up information is provided on this After Visit Summary.  MyChart is used to connect with patients for Virtual Visits (Telemedicine).  Patients are able to view lab/test results, encounter notes, upcoming appointments, etc.  Non-urgent messages can be sent to your provider as well.   To learn more about what you can do with MyChart, go to [email or web address].    Your next appointment:   8 month(s)  The format for your next appointment:   In Person  Provider:   [name], MD   Other Instructions Thank you for choosing [facility]!

## 2020-12-22 ENCOUNTER — Ambulatory Visit: Payer: BC Managed Care – PPO | Admitting: Internal Medicine

## 2021-01-13 DIAGNOSIS — E113513 Type 2 diabetes mellitus with proliferative diabetic retinopathy with macular edema, bilateral: Secondary | ICD-10-CM | POA: Diagnosis not present

## 2021-01-13 DIAGNOSIS — H26492 Other secondary cataract, left eye: Secondary | ICD-10-CM | POA: Diagnosis not present

## 2021-02-10 DIAGNOSIS — L308 Other specified dermatitis: Secondary | ICD-10-CM | POA: Diagnosis not present

## 2021-02-17 DIAGNOSIS — E113513 Type 2 diabetes mellitus with proliferative diabetic retinopathy with macular edema, bilateral: Secondary | ICD-10-CM | POA: Diagnosis not present

## 2021-02-17 DIAGNOSIS — H26492 Other secondary cataract, left eye: Secondary | ICD-10-CM | POA: Diagnosis not present

## 2021-02-25 DIAGNOSIS — Z01411 Encounter for gynecological examination (general) (routine) with abnormal findings: Secondary | ICD-10-CM | POA: Diagnosis not present

## 2021-02-25 DIAGNOSIS — Z6834 Body mass index (BMI) 34.0-34.9, adult: Secondary | ICD-10-CM | POA: Diagnosis not present

## 2021-02-25 DIAGNOSIS — E7849 Other hyperlipidemia: Secondary | ICD-10-CM | POA: Diagnosis not present

## 2021-02-25 DIAGNOSIS — E6609 Other obesity due to excess calories: Secondary | ICD-10-CM | POA: Diagnosis not present

## 2021-02-25 DIAGNOSIS — I10 Essential (primary) hypertension: Secondary | ICD-10-CM | POA: Diagnosis not present

## 2021-02-25 DIAGNOSIS — E1165 Type 2 diabetes mellitus with hyperglycemia: Secondary | ICD-10-CM | POA: Diagnosis not present

## 2021-02-25 DIAGNOSIS — M13841 Other specified arthritis, right hand: Secondary | ICD-10-CM | POA: Diagnosis not present

## 2021-02-25 DIAGNOSIS — Z124 Encounter for screening for malignant neoplasm of cervix: Secondary | ICD-10-CM | POA: Diagnosis not present

## 2021-03-17 DIAGNOSIS — E113513 Type 2 diabetes mellitus with proliferative diabetic retinopathy with macular edema, bilateral: Secondary | ICD-10-CM | POA: Diagnosis not present

## 2021-03-17 DIAGNOSIS — H26492 Other secondary cataract, left eye: Secondary | ICD-10-CM | POA: Diagnosis not present

## 2021-04-16 DIAGNOSIS — E113513 Type 2 diabetes mellitus with proliferative diabetic retinopathy with macular edema, bilateral: Secondary | ICD-10-CM | POA: Diagnosis not present

## 2021-04-16 DIAGNOSIS — H26492 Other secondary cataract, left eye: Secondary | ICD-10-CM | POA: Diagnosis not present

## 2021-04-23 DIAGNOSIS — R319 Hematuria, unspecified: Secondary | ICD-10-CM | POA: Diagnosis not present

## 2021-04-23 DIAGNOSIS — E6609 Other obesity due to excess calories: Secondary | ICD-10-CM | POA: Diagnosis not present

## 2021-04-23 DIAGNOSIS — N342 Other urethritis: Secondary | ICD-10-CM | POA: Diagnosis not present

## 2021-04-23 DIAGNOSIS — Z6834 Body mass index (BMI) 34.0-34.9, adult: Secondary | ICD-10-CM | POA: Diagnosis not present

## 2021-05-14 DIAGNOSIS — E782 Mixed hyperlipidemia: Secondary | ICD-10-CM | POA: Diagnosis not present

## 2021-05-14 DIAGNOSIS — E6609 Other obesity due to excess calories: Secondary | ICD-10-CM | POA: Diagnosis not present

## 2021-05-14 DIAGNOSIS — E11319 Type 2 diabetes mellitus with unspecified diabetic retinopathy without macular edema: Secondary | ICD-10-CM | POA: Diagnosis not present

## 2021-05-14 DIAGNOSIS — E113511 Type 2 diabetes mellitus with proliferative diabetic retinopathy with macular edema, right eye: Secondary | ICD-10-CM | POA: Diagnosis not present

## 2021-05-14 DIAGNOSIS — I10 Essential (primary) hypertension: Secondary | ICD-10-CM | POA: Diagnosis not present

## 2021-05-14 DIAGNOSIS — E1165 Type 2 diabetes mellitus with hyperglycemia: Secondary | ICD-10-CM | POA: Diagnosis not present

## 2021-05-14 DIAGNOSIS — Z6834 Body mass index (BMI) 34.0-34.9, adult: Secondary | ICD-10-CM | POA: Diagnosis not present

## 2021-06-11 DIAGNOSIS — E113513 Type 2 diabetes mellitus with proliferative diabetic retinopathy with macular edema, bilateral: Secondary | ICD-10-CM | POA: Diagnosis not present

## 2021-06-11 DIAGNOSIS — H26492 Other secondary cataract, left eye: Secondary | ICD-10-CM | POA: Diagnosis not present

## 2021-06-29 ENCOUNTER — Encounter (HOSPITAL_COMMUNITY): Payer: Self-pay

## 2021-06-29 ENCOUNTER — Emergency Department (HOSPITAL_COMMUNITY)
Admission: EM | Admit: 2021-06-29 | Discharge: 2021-06-29 | Disposition: A | Discharge status: Home / Self Care | Payer: BC Managed Care – PPO | Attending: Emergency Medicine | Admitting: Emergency Medicine

## 2021-06-29 ENCOUNTER — Other Ambulatory Visit: Payer: Self-pay

## 2021-06-29 DIAGNOSIS — Y92410 Unspecified street and highway as the place of occurrence of the external cause: Secondary | ICD-10-CM | POA: Diagnosis not present

## 2021-06-29 DIAGNOSIS — I10 Essential (primary) hypertension: Secondary | ICD-10-CM | POA: Diagnosis not present

## 2021-06-29 DIAGNOSIS — E1140 Type 2 diabetes mellitus with diabetic neuropathy, unspecified: Secondary | ICD-10-CM | POA: Diagnosis not present

## 2021-06-29 DIAGNOSIS — Z79899 Other long term (current) drug therapy: Secondary | ICD-10-CM | POA: Insufficient documentation

## 2021-06-29 DIAGNOSIS — S169XXA Unspecified injury of muscle, fascia and tendon at neck level, initial encounter: Secondary | ICD-10-CM | POA: Diagnosis not present

## 2021-06-29 DIAGNOSIS — Z7984 Long term (current) use of oral hypoglycemic drugs: Secondary | ICD-10-CM | POA: Insufficient documentation

## 2021-06-29 DIAGNOSIS — S161XXA Strain of muscle, fascia and tendon at neck level, initial encounter: Secondary | ICD-10-CM

## 2021-06-29 DIAGNOSIS — Z794 Long term (current) use of insulin: Secondary | ICD-10-CM | POA: Diagnosis not present

## 2021-06-29 MED ORDER — CYCLOBENZAPRINE HCL 10 MG PO TABS: 10.0000 mg | ORAL_TABLET | Freq: Two times a day (BID) | ORAL | 0 refills | Status: DC | PRN

## 2021-06-29 MED ORDER — CYCLOBENZAPRINE HCL 10 MG PO TABS
10.0000 mg | ORAL_TABLET | Freq: Once | ORAL | Status: AC
  Administered 2021-06-29: 10 mg via ORAL
  Filled 2021-06-29: qty 1

## 2021-06-29 MED ORDER — NAPROXEN 375 MG PO TABS: 375.0000 mg | ORAL_TABLET | Freq: Two times a day (BID) | ORAL | 0 refills | Status: DC

## 2021-06-29 NOTE — Discharge Instructions (Addendum)
Take the medications as needed for pain.  Follow-up with your doctor if not improving next week
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
## 2021-07-13 DIAGNOSIS — E113511 Type 2 diabetes mellitus with proliferative diabetic retinopathy with macular edema, right eye: Secondary | ICD-10-CM | POA: Diagnosis not present

## 2021-07-16 ENCOUNTER — Other Ambulatory Visit: Payer: Self-pay

## 2021-07-16 ENCOUNTER — Ambulatory Visit (INDEPENDENT_AMBULATORY_CARE_PROVIDER_SITE_OTHER): Payer: BC Managed Care – PPO | Admitting: Urology

## 2021-07-16 ENCOUNTER — Encounter: Payer: Self-pay | Admitting: Urology

## 2021-07-16 VITALS — BP 132/78 | HR 69 | Temp 98.9°F | Wt 226.4 lb

## 2021-07-16 DIAGNOSIS — R350 Frequency of micturition: Secondary | ICD-10-CM

## 2021-07-16 DIAGNOSIS — R3129 Other microscopic hematuria: Secondary | ICD-10-CM

## 2021-07-16 LAB — URINALYSIS, ROUTINE W REFLEX MICROSCOPIC
Bilirubin, UA: NEGATIVE
Ketones, UA: NEGATIVE
Leukocytes,UA: NEGATIVE
Nitrite, UA: NEGATIVE
Specific Gravity, UA: >1.03 — ABNORMAL HIGH (ref 1.005–1.030)
Urobilinogen, Ur: 0.2 mg/dL (ref 0.2–1.0)
pH, UA: 5 (ref 5.0–7.5)

## 2021-07-16 LAB — MICROSCOPIC EXAMINATION
Epithelial Cells (non renal): >10 /hpf — AB (ref 0–10)
Renal Epithel, UA: NONE SEEN /hpf

## 2021-07-16 NOTE — Progress Notes (Signed)
Urological Symptom Review  Patient is experiencing the following symptoms: Frequent urination Hard to postpone urination Get up at night to urinate Stream starts and stops Trouble starting stream Have to strain to urinate Weak stream   Review of Systems  Gastrointestinal (upper)  : Negative for upper GI symptoms  Gastrointestinal (lower) : Constipation  Constitutional : Negative for symptoms  Skin: Negative for skin symptoms  Eyes: Negative for eye symptoms  Ear/Nose/Throat : Negative for Ear/Nose/Throat symptoms  Hematologic/Lymphatic: Negative for Hematologic/Lymphatic symptoms  Cardiovascular : Negative for cardiovascular symptoms  Respiratory : Negative for respiratory symptoms  Endocrine: Negative for endocrine symptoms  Musculoskeletal: Back pain  Neurological: Negative for neurological symptoms  Psychologic: Negative for psychiatric symptoms

## 2021-07-16 NOTE — Patient Instructions (Signed)

## 2021-07-16 NOTE — Progress Notes (Signed)
07/16/2021 10:06 AM   Alexis Landry May 31, 1971 [id number]  Referring provider: [name], PA-C [address] [place],  [place] [address]  microhematuria   HPI: Alexis Landry is a 50yo here for evaluation of microhematuria. UA today shows 3-10 RBCs. She denies any history of nephrolithiasis. She denies any flank pain. No tobacco abuse history. No history of UTI. No significant LUTS. No family history of microhematuria. No other complaints today   PMH: Past Medical History:  Diagnosis Date   Diabetic neuropathy ([place])    DM (diabetes mellitus), type 2, uncontrolled 02/24/2015   Epiretinal membrane, right    GERD (gastroesophageal reflux disease) 02/24/2015   HTN (hypertension) 02/24/2015   Hyperlipidemia 02/24/2015   Macular edema, diabetic ([place])    Rheumatoid arthritis ([place]) 02/24/2015   Vitamin D deficiency disease 02/24/2015    Surgical History: Past Surgical History:  Procedure Laterality Date   COLONOSCOPY N/A 01/04/2017   Procedure: COLONOSCOPY;  Surgeon: Danie Binder, MD;  [facility];  Service: Endoscopy;  Laterality: N/A;  10:30am   KENALOG INJECTION Right 12/03/2018   Procedure: LUCENTIS 0.3MG  INJECTION;  Surgeon: Sherlynn Stalls, MD;  Location: [place];  Service: Ophthalmology;  Laterality: Right;   MEMBRANE PEEL Right 12/03/2018   Procedure: MEMBRANE PEEL;  Surgeon: Sherlynn Stalls, MD;  Location: [place];  Service: Ophthalmology;  Laterality: Right;   PARS PLANA VITRECTOMY Right 12/03/2018   Procedure: PARS PLANA VITRECTOMY WITH 25 GAUGE;  Surgeon: Sherlynn Stalls, MD;  Location: [place];  Service: Ophthalmology;  Laterality: Right;   PHOTOCOAGULATION WITH LASER Right 12/03/2018   Procedure: PHOTOCOAGULATION WITH LASER;  Surgeon: Sherlynn Stalls, MD;  Location: [place];  Service: Ophthalmology;  Laterality: Right;   TUBAL LIGATION      Home Medications:  Allergies as of 07/16/2021       Reactions   Lisinopril Anaphylaxis   Throat swelling    Sulfa  Antibiotics Itching        Medication List        Accurate as of July 16, 2021 10:06 AM. If you have any questions, ask your nurse or doctor.          albuterol 108 (90 Base) MCG/ACT inhaler Commonly known as: VENTOLIN HFA Inhale 1-2 puffs into the lungs every 6 (six) hours as needed for shortness of breath.   amLODipine 5 MG tablet Commonly known as: NORVASC Take 1 tablet (5 mg total) by mouth daily.   atorvastatin 10 MG tablet Commonly known as: LIPITOR Take 10 mg by mouth every evening.   Besivance 0.6 % Susp Generic drug: Besifloxacin HCl Place 1 drop into the right eye 3 (three) times daily.   cloNIDine HCl 0.1 MG Tb12 ER tablet Commonly known as: KAPVAY Take 0.1 mg by mouth at bedtime.   cyclobenzaprine 10 MG tablet Commonly known as: FLEXERIL Take 1 tablet (10 mg total) by mouth 2 (two) times daily as needed for muscle spasms.   Durezol 0.05 % Emul Generic drug: Difluprednate Place 1 drop into the right eye 3 (three) times daily.   furosemide 20 MG tablet Commonly known as: LASIX Take 20 mg by mouth daily.   Jardiance 25 MG Tabs tablet Generic drug: empagliflozin Take 25 mg by mouth every morning.   losartan-hydrochlorothiazide 100-25 MG tablet Commonly known as: HYZAAR Take 1 tablet by mouth daily.   meloxicam 15 MG tablet Commonly known as: MOBIC Take 15 mg by mouth daily as needed for pain.   metFORMIN 500 MG  24 hr tablet Commonly known as: GLUCOPHAGE-XR Take by mouth.   naproxen 375 MG tablet Commonly known as: NAPROSYN Take 1 tablet (375 mg total) by mouth 2 (two) times daily.   ofloxacin 0.3 % ophthalmic solution Commonly known as: OCUFLOX Place 1 drop into both eyes every 4 (four) hours.   omeprazole 20 MG capsule Commonly known as: PRILOSEC Take 20 mg by mouth daily.   Prolensa 0.07 % Soln Generic drug: Bromfenac Sodium Place 1 drop into the right eye at bedtime.   Soliqua 100-33 UNT-MCG/ML Sopn Generic drug: Insulin  Glargine-Lixisenatide Inject 40 Units into the skin in the morning.   triamcinolone cream 0.1 % Commonly known as: KENALOG Apply 1 application topically See admin instructions. 1 application 2 to 3 times daily        Allergies:  Allergies  Allergen Reactions   Lisinopril Anaphylaxis    Throat swelling    Sulfa Antibiotics Itching    Family History: Family History  Problem Relation Age of Onset   Heart disease Mother    Kidney disease Father    Diabetes Maternal Grandfather    Diabetes Paternal Grandmother    Diabetes Paternal Grandfather    Colon cancer Neg Hx     Social History:  reports that she has never smoked. She has never used smokeless tobacco. She reports current alcohol use. She reports that she does not use drugs.  ROS: All other review of systems were reviewed and are negative except what is noted above in HPI  Physical Exam: BP 132/78   Pulse 69   Temp 98.9 F (37.2 C)   Wt 226 lb 6.4 oz (102.7 kg)   LMP 09/19/2016 (Approximate)   BMI 36.54 kg/m   Constitutional:  Alert and oriented, No acute distress. HEENT: Alexis Landry AT, moist mucus membranes.  Trachea midline, no masses. Cardiovascular: No clubbing, cyanosis, or edema. Respiratory: Normal respiratory effort, no increased work of breathing. GI: Abdomen is soft, nontender, nondistended, no abdominal masses GU: No CVA tenderness.  Lymph: No cervical or inguinal lymphadenopathy. Skin: No rashes, bruises or suspicious lesions. Neurologic: Grossly intact, no focal deficits, moving all 4 extremities. Psychiatric: Normal mood and affect.  Laboratory Data: Lab Results  Component Value Date   WBC 6.2 09/01/2020   HGB 11.7 (L) 09/01/2020   HCT 35.9 (L) 09/01/2020   MCV 85.5 09/01/2020   PLT 342 09/01/2020    Lab Results  Component Value Date   CREATININE 1.34 (H) 12/16/2020    No results found for: PSA  No results found for: TESTOSTERONE  No results found for: HGBA1C  Urinalysis    Component  Value Date/Time   COLORURINE YELLOW 09/28/2016 1811   APPEARANCEUR CLEAR 09/28/2016 1811   LABSPEC >1.030 (H) 09/28/2016 1811   PHURINE 6.0 09/28/2016 1811   GLUCOSEU >=500 (A) 09/28/2016 1811   HGBUR SMALL (A) 09/28/2016 1811   BILIRUBINUR NEGATIVE 09/28/2016 1811   KETONESUR NEGATIVE 09/28/2016 1811   PROTEINUR 100 (A) 09/28/2016 1811   UROBILINOGEN 0.2 07/24/2009 1050   NITRITE NEGATIVE 09/28/2016 1811   LEUKOCYTESUR NEGATIVE 09/28/2016 1811    Lab Results  Component Value Date   BACTERIA RARE (A) 09/28/2016    Pertinent Imaging:  Results for orders placed during the hospital encounter of 07/24/09  DG Abd 1 View  Narrative Clinical Data: Right flank pain  ABDOMEN - 1 VIEW  Comparison: 05/10/2008  Findings: Stable calcifications in the pelvis compatible with phleboliths.  No calcifications project over the kidneys or expected course  of the ureters.  Moderate stool in the rectosigmoid colon.  No obstruction.  No acute bony abnormality.  IMPRESSION: No acute findings.  Moderate stool burden and rectosigmoid colon.  Provider: Jennye Boroughs  No results found for this or any previous visit.  No results found for this or any previous visit.  No results found for this or any previous visit.  No results found for this or any previous visit.  No results found for this or any previous visit.  No results found for this or any previous visit.  Results for orders placed during the hospital encounter of 09/28/16  CT RENAL STONE STUDY  Narrative CLINICAL DATA:  Right flank pain.  Urinary frequency.  EXAM: CT ABDOMEN AND PELVIS WITHOUT CONTRAST  TECHNIQUE: Multidetector CT imaging of the abdomen and pelvis was performed following the standard protocol without IV contrast.  COMPARISON:  Pelvic ultrasound 02/25/2015.  CT 522 L5  FINDINGS: Lower chest: The lung bases are clear.  Hepatobiliary: No focal liver abnormality is seen. No gallstones, gallbladder  wall thickening, or biliary dilatation.  Pancreas: No ductal dilatation or inflammation. Faint curvilinear calcification about the pancreatic tail is felt to be secondary to splenic vasculature.  Spleen: Normal in size without focal abnormality.  Adrenals/Urinary Tract: Adrenal glands are unremarkable. Kidneys are symmetric in size without hydronephrosis or perinephric edema. No evidence of urolithiasis. Suspect small cyst in the mid left kidney, suboptimally assessed without contrast. Bladder is unremarkable. Multiple pelvic calcifications felt to be phleboliths and outside the course of the ureters.  Stomach/Bowel: Stomach is within normal limits. Appendix appears normal. No evidence of bowel wall thickening, distention, or inflammatory changes.  Vascular/Lymphatic: Vascular calcifications in the pelvis. Abdominal aorta is normal in caliber. Small retroperitoneal nodes not enlarged by size criteria.  Reproductive: Bowel is uterus with probable fibroid, better characterized on prior MRI. Ovaries not well characterized but grossly symmetric in size. Small amount of free fluid in the pelvis is physiologic.  Other: No free air.  Tiny fat containing umbilical hernia.  Musculoskeletal: Degenerative disc disease at L4-L5 with vacuum phenomenon and surrounding endplate change. There are no acute or suspicious osseous abnormalities.  IMPRESSION: 1. No renal stones or obstructive uropathy. No acute abnormality in the abdomen/pelvis. 2. Uterine fibroid.   Electronically Signed By: Jeb Levering M.D. On: 09/28/2016 21:30   Assessment & Plan:    1. microhematuria -BMP -CT hematuria -next available office cystoscopy - Urinalysis, Routine w reflex microscopic   No follow-ups on file.  Nicolette Bang, MD  [facility] [place]

## 2021-07-17 LAB — BASIC METABOLIC PANEL
BUN/Creatinine Ratio: 17 (ref 9–23)
BUN: 22 mg/dL (ref 6–24)
CO2: 25 mmol/L (ref 20–29)
Calcium: 9.3 mg/dL (ref 8.7–10.2)
Chloride: 105 mmol/L (ref 96–106)
Creatinine, Ser: 1.33 mg/dL — ABNORMAL HIGH (ref 0.57–1.00)
Glucose: 70 mg/dL (ref 70–99)
Potassium: 4.3 mmol/L (ref 3.5–5.2)
Sodium: 144 mmol/L (ref 134–144)
eGFR: 48 mL/min/{1.73_m2} — ABNORMAL LOW (ref >59)

## 2021-07-27 DIAGNOSIS — E113393 Type 2 diabetes mellitus with moderate nonproliferative diabetic retinopathy without macular edema, bilateral: Secondary | ICD-10-CM | POA: Diagnosis not present

## 2021-07-27 DIAGNOSIS — H26492 Other secondary cataract, left eye: Secondary | ICD-10-CM | POA: Diagnosis not present

## 2021-07-27 DIAGNOSIS — H18413 Arcus senilis, bilateral: Secondary | ICD-10-CM | POA: Diagnosis not present

## 2021-07-27 DIAGNOSIS — Z794 Long term (current) use of insulin: Secondary | ICD-10-CM | POA: Diagnosis not present

## 2021-07-27 DIAGNOSIS — Z961 Presence of intraocular lens: Secondary | ICD-10-CM | POA: Diagnosis not present

## 2021-08-08 NOTE — Progress Notes (Signed)
Cardiology Office Note   Date:  08/09/2021   ID:  Alexis Landry, DOB [birth year], MRN [id number]  PCP:  [name], PA-C  Cardiologist:   Alexis Carnes, MD   Pt presents for follow up of chest pressure   History of Present Illness: Alexis Landry is a 50 y.o. female with a history of HTN, HL, DM who is followed at [facility]  She also has a hx of chest pressure / heaviness   In 2021 was seen in ED .BP elevated   Trop neg  D dimer mildly elevated but CT neg for PE   Small pericardial effusion noted   She was placed on naprosyn and lasix   I saw her in Jan 2022   I did not think it sounded like pericarditis or ischemia   Set pt up for echo   Also set up for USN of carotids  BP was elevated and I added amlodpine    Echo was done on  11/10/20  OVerall Normal   I recomm watching salt     Carotid USN (done for murmur) was also normal   I saw her in March  2022    She denes CP   Breathing is good  No palpitaitons   No dizzness  Diet:  1 meal per day usually  Does eat carbs     Current Meds  Medication Sig   amLODipine (NORVASC) 10 MG tablet Take 1 tablet (10 mg total) by mouth daily.   atorvastatin (LIPITOR) 10 MG tablet Take 10 mg by mouth every evening.    cloNIDine HCl (KAPVAY) 0.1 MG TB12 ER tablet Take 0.1 mg by mouth at bedtime.   FARXIGA 10 MG TABS tablet Take 10 mg by mouth every morning.   furosemide (LASIX) 20 MG tablet Take 20 mg by mouth daily.   losartan-hydrochlorothiazide (HYZAAR) 100-25 MG tablet Take 1 tablet by mouth daily.   meloxicam (MOBIC) 15 MG tablet Take 15 mg by mouth daily as needed for pain.   metFORMIN (GLUCOPHAGE-XR) 500 MG 24 hr tablet Take by mouth.   ofloxacin (OCUFLOX) 0.3 % ophthalmic solution Place 1 drop into both eyes every 4 (four) hours.   omeprazole (PRILOSEC) 20 MG capsule Take 20 mg by mouth daily.   SOLIQUA 100-33 UNT-MCG/ML SOPN Inject 40 Units into the skin in the morning.   triamcinolone (KENALOG) 0.1 % Apply 1 application  topically See admin instructions. 1 application 2 to 3 times daily   [DISCONTINUED] amLODipine (NORVASC) 5 MG tablet Take 1 tablet (5 mg total) by mouth daily.     Allergies:   Lisinopril and Sulfa antibiotics   Past Medical History:  Diagnosis Date   Diabetic neuropathy ([place])    DM (diabetes mellitus), type 2, uncontrolled 02/24/2015   Epiretinal membrane, right    GERD (gastroesophageal reflux disease) 02/24/2015   HTN (hypertension) 02/24/2015   Hyperlipidemia 02/24/2015   Macular edema, diabetic ([place])    Rheumatoid arthritis ([place]) 02/24/2015   Vitamin D deficiency disease 02/24/2015    Past Surgical History:  Procedure Laterality Date   COLONOSCOPY N/A 01/04/2017   Procedure: COLONOSCOPY;  Surgeon: Danie Binder, MD;  [facility];  Service: Endoscopy;  Laterality: N/A;  10:30am   KENALOG INJECTION Right 12/03/2018   Procedure: LUCENTIS 0.3MG  INJECTION;  Surgeon: Sherlynn Stalls, MD;  Location: [place];  Service: Ophthalmology;  Laterality: Right;   MEMBRANE PEEL Right 12/03/2018   Procedure: MEMBRANE PEEL;  Surgeon: Sherlynn Stalls,  MD;  Location: [place];  Service: Ophthalmology;  Laterality: Right;   PARS PLANA VITRECTOMY Right 12/03/2018   Procedure: PARS PLANA VITRECTOMY WITH 25 GAUGE;  Surgeon: Sherlynn Stalls, MD;  Location: [place];  Service: Ophthalmology;  Laterality: Right;   PHOTOCOAGULATION WITH LASER Right 12/03/2018   Procedure: PHOTOCOAGULATION WITH LASER;  Surgeon: Sherlynn Stalls, MD;  Location: [place];  Service: Ophthalmology;  Laterality: Right;   TUBAL LIGATION       Social History:  The patient  reports that she has never smoked. She has never used smokeless tobacco. She reports current alcohol use. She reports that she does not use drugs.   Family History:  The patient's family history includes Diabetes in her maternal grandfather, paternal grandfather, and paternal grandmother; Heart disease in her mother; Kidney disease in her father.    ROS:  Please see the  history of present illness. All other systems are reviewed and  Negative to the above problem except as noted.    PHYSICAL EXAM: VS:  BP (!) 148/70   Pulse 68   Ht 5\' 6"  (1.676 m)   Wt 231 lb (104.8 kg)   LMP 09/19/2016 (Approximate)   SpO2 98%   BMI 37.28 kg/m   GEN: Morbidly obese 50 yo in no acute distress  HEENT: normal  Neck: no JVD, Cardiac: RRR; no murmurs  No LE  edema  Respiratory:  clear to auscultation bilaterally, GI: soft, nontender, nondistended, + BS   MS: no deformity Moving all extremities   Skin: warm and dry, no rash Neuro:  Strength and sensation are intact Psych: euthymic mood, full affect   EKG:  EKG is not ordered today.   Echo  11/10/20  1. Left ventricular ejection fraction, by estimation, is 65 to 70%. The left ventricle has normal function. The left ventricle has no regional wall motion abnormalities. There is mild left ventricular hypertrophy. Left ventricular diastolic parameters are indeterminate. Global longitudinal strain measurement low normal at -16.4% and does not track myocardium completely, suspect higher actual measurment. 2. Right ventricular systolic function is normal. The right ventricular size is normal. There is normal pulmonary artery systolic pressure. The estimated right ventricular systolic pressure is 24.0 mmHg. 3. Left atrial size was upper normal. 4. There is a trivial pericardial effusion that is circumferential. 5. The mitral valve is grossly normal. Mild mitral valve regurgitation. 6. The aortic valve is tricuspid. Aortic valve regurgitation is not visualized. 7. The inferior vena cava is normal in size with greater than 50% respiratory variability, suggesting right atrial pressure of 3 mmHg.  11/10/20  Carotid USN  Normal     Lipid Panel No results found for: CHOL, TRIG, HDL, CHOLHDL, VLDL, LDLCALC, LDLDIRECT    Wt Readings from Last 3 Encounters:  08/09/21 231 lb (104.8 kg)  07/16/21 226 lb 6.4 oz (102.7 kg)   06/29/21 229 lb (103.9 kg)      ASSESSMENT AND PLAN:  1  Hx of Chest pressure  Denies  2  HTN  BP is not optimal  146/76 on my check   She says it is like this or higher at home     Would increase amloidipine to 10 mg    Follow up in  January   3  DOE   VOlume is OK on exam   Follow   Stay active        4  Neck murmur   Carotid USN was normal     5 HL  Keep  on lipitor    6   Obesity  Again discussed low carb, keop and TRE   DIscussed diet, limit carbs   COnsider TRE      Plan for f/u inApril    Current medicines are reviewed at length with the patient today.  The patient does not have concerns regarding medicines.  Signed, Alexis Carnes, MD  08/09/2021 1:03 PM    [address], [place], [place]  [address] Phone: [phone]; Fax: [phone]

## 2021-08-09 ENCOUNTER — Ambulatory Visit (INDEPENDENT_AMBULATORY_CARE_PROVIDER_SITE_OTHER): Payer: BC Managed Care – PPO | Admitting: Internal Medicine

## 2021-08-09 ENCOUNTER — Encounter: Payer: Self-pay | Admitting: Internal Medicine

## 2021-08-09 ENCOUNTER — Other Ambulatory Visit: Payer: Self-pay

## 2021-08-09 VITALS — BP 148/70 | HR 68 | Ht 66.0 in | Wt 231.0 lb

## 2021-08-09 DIAGNOSIS — I10 Essential (primary) hypertension: Secondary | ICD-10-CM

## 2021-08-09 MED ORDER — AMLODIPINE BESYLATE 10 MG PO TABS: 10.0000 mg | ORAL_TABLET | Freq: Every day | ORAL | 3 refills | Status: DC

## 2021-08-09 NOTE — Patient Instructions (Signed)
Medication Instructions:   Increase Norvasc to 10 mg Daily   *If you need a refill on your cardiac medications before your next appointment, please call your pharmacy*   Lab Work: NONE  If you have labs (blood work) drawn today and your tests are completely normal, you will receive your results only by: [place] (if you have MyChart) OR A paper copy in the mail If you have any lab test that is abnormal or we need to change your treatment, we will call you to review the results.   Testing/Procedures: NONE    Follow-Up: At [facility], you and your health needs are our priority.  As part of our continuing mission to provide you with exceptional heart care, we have created designated Provider Care Teams.  These Care Teams include your primary Cardiologist (physician) and Advanced Practice Providers (APPs -  Physician Assistants and Nurse Practitioners) who all work together to provide you with the care you need, when you need it.  We recommend signing up for the patient portal called "MyChart".  Sign up information is provided on this After Visit Summary.  MyChart is used to connect with patients for Virtual Visits (Telemedicine).  Patients are able to view lab/test results, encounter notes, upcoming appointments, etc.  Non-urgent messages can be sent to your provider as well.   To learn more about what you can do with MyChart, go to [email or web address].    Your next appointment:    April   The format for your next appointment:   In Person  Provider:   [name], MD  Other Instructions Thank you for choosing [facility]!

## 2021-08-13 DIAGNOSIS — H26491 Other secondary cataract, right eye: Secondary | ICD-10-CM | POA: Diagnosis not present

## 2021-08-13 DIAGNOSIS — H31093 Other chorioretinal scars, bilateral: Secondary | ICD-10-CM | POA: Diagnosis not present

## 2021-08-13 DIAGNOSIS — E113513 Type 2 diabetes mellitus with proliferative diabetic retinopathy with macular edema, bilateral: Secondary | ICD-10-CM | POA: Diagnosis not present

## 2021-09-01 ENCOUNTER — Other Ambulatory Visit: Payer: Self-pay | Admitting: Internal Medicine

## 2021-09-02 MED ORDER — AMLODIPINE BESYLATE 10 MG PO TABS: 10.0000 mg | ORAL_TABLET | Freq: Every day | ORAL | 3 refills | Status: DC

## 2021-09-03 DIAGNOSIS — E113513 Type 2 diabetes mellitus with proliferative diabetic retinopathy with macular edema, bilateral: Secondary | ICD-10-CM | POA: Diagnosis not present

## 2021-09-03 DIAGNOSIS — H26491 Other secondary cataract, right eye: Secondary | ICD-10-CM | POA: Diagnosis not present

## 2021-09-03 DIAGNOSIS — H31093 Other chorioretinal scars, bilateral: Secondary | ICD-10-CM | POA: Diagnosis not present

## 2021-09-06 ENCOUNTER — Ambulatory Visit (HOSPITAL_COMMUNITY): Admission: RE | Admit: 2021-09-06 | Payer: BC Managed Care – PPO | Source: Ambulatory Visit

## 2021-09-10 ENCOUNTER — Other Ambulatory Visit: Payer: BC Managed Care – PPO | Admitting: Urology

## 2021-09-10 DIAGNOSIS — R350 Frequency of micturition: Secondary | ICD-10-CM

## 2021-09-10 DIAGNOSIS — R3129 Other microscopic hematuria: Secondary | ICD-10-CM

## 2021-09-13 ENCOUNTER — Observation Stay (HOSPITAL_COMMUNITY)
Admission: EM | Admit: 2021-09-13 | Discharge: 2021-09-15 | Disposition: A | Discharge status: Home / Self Care | Payer: BC Managed Care – PPO | Attending: Cardiology | Admitting: Cardiology

## 2021-09-13 ENCOUNTER — Other Ambulatory Visit: Payer: Self-pay

## 2021-09-13 ENCOUNTER — Emergency Department (HOSPITAL_COMMUNITY): Payer: BC Managed Care – PPO

## 2021-09-13 ENCOUNTER — Encounter (HOSPITAL_COMMUNITY): Payer: Self-pay | Admitting: Cardiology

## 2021-09-13 ENCOUNTER — Observation Stay (HOSPITAL_COMMUNITY): Payer: BC Managed Care – PPO

## 2021-09-13 DIAGNOSIS — Z7984 Long term (current) use of oral hypoglycemic drugs: Secondary | ICD-10-CM | POA: Diagnosis not present

## 2021-09-13 DIAGNOSIS — Z794 Long term (current) use of insulin: Secondary | ICD-10-CM | POA: Diagnosis not present

## 2021-09-13 DIAGNOSIS — I200 Unstable angina: Secondary | ICD-10-CM | POA: Diagnosis not present

## 2021-09-13 DIAGNOSIS — Z79899 Other long term (current) drug therapy: Secondary | ICD-10-CM | POA: Insufficient documentation

## 2021-09-13 DIAGNOSIS — R0602 Shortness of breath: Secondary | ICD-10-CM | POA: Diagnosis not present

## 2021-09-13 DIAGNOSIS — R079 Chest pain, unspecified: Secondary | ICD-10-CM | POA: Diagnosis present

## 2021-09-13 DIAGNOSIS — J90 Pleural effusion, not elsewhere classified: Secondary | ICD-10-CM | POA: Diagnosis not present

## 2021-09-13 DIAGNOSIS — M069 Rheumatoid arthritis, unspecified: Secondary | ICD-10-CM | POA: Diagnosis present

## 2021-09-13 DIAGNOSIS — E13319 Other specified diabetes mellitus with unspecified diabetic retinopathy without macular edema: Secondary | ICD-10-CM | POA: Diagnosis not present

## 2021-09-13 DIAGNOSIS — Z20822 Contact with and (suspected) exposure to covid-19: Secondary | ICD-10-CM | POA: Insufficient documentation

## 2021-09-13 DIAGNOSIS — E119 Type 2 diabetes mellitus without complications: Secondary | ICD-10-CM | POA: Diagnosis not present

## 2021-09-13 DIAGNOSIS — J9811 Atelectasis: Secondary | ICD-10-CM | POA: Diagnosis not present

## 2021-09-13 DIAGNOSIS — E1169 Type 2 diabetes mellitus with other specified complication: Secondary | ICD-10-CM | POA: Diagnosis not present

## 2021-09-13 DIAGNOSIS — Z23 Encounter for immunization: Secondary | ICD-10-CM | POA: Diagnosis not present

## 2021-09-13 DIAGNOSIS — I10 Essential (primary) hypertension: Secondary | ICD-10-CM | POA: Insufficient documentation

## 2021-09-13 DIAGNOSIS — I25110 Atherosclerotic heart disease of native coronary artery with unstable angina pectoris: Principal | ICD-10-CM | POA: Insufficient documentation

## 2021-09-13 DIAGNOSIS — R0789 Other chest pain: Secondary | ICD-10-CM | POA: Diagnosis not present

## 2021-09-13 DIAGNOSIS — E785 Hyperlipidemia, unspecified: Secondary | ICD-10-CM | POA: Diagnosis not present

## 2021-09-13 HISTORY — DX: Vitamin D deficiency, unspecified: E55.9

## 2021-09-13 LAB — D-DIMER, QUANTITATIVE: D-Dimer, Quant: 1.3 ug/mL-FEU — ABNORMAL HIGH (ref 0.00–0.50)

## 2021-09-13 LAB — COMPREHENSIVE METABOLIC PANEL
ALT: 18 U/L (ref 0–44)
AST: 18 U/L (ref 15–41)
Albumin: 3.2 g/dL — ABNORMAL LOW (ref 3.5–5.0)
Alkaline Phosphatase: 48 U/L (ref 38–126)
Anion gap: 1 — ABNORMAL LOW (ref 5–15)
BUN: 19 mg/dL (ref 6–20)
CO2: 26 mmol/L (ref 22–32)
Calcium: 8.6 mg/dL — ABNORMAL LOW (ref 8.9–10.3)
Chloride: 111 mmol/L (ref 98–111)
Creatinine, Ser: 1.21 mg/dL — ABNORMAL HIGH (ref 0.44–1.00)
GFR, Estimated: 54 mL/min — ABNORMAL LOW (ref >60)
Glucose, Bld: 181 mg/dL — ABNORMAL HIGH (ref 70–99)
Potassium: 3.5 mmol/L (ref 3.5–5.1)
Sodium: 138 mmol/L (ref 135–145)
Total Bilirubin: 1.2 mg/dL (ref 0.3–1.2)
Total Protein: 6.7 g/dL (ref 6.5–8.1)

## 2021-09-13 LAB — CBC
HCT: 32.3 % — ABNORMAL LOW (ref 36.0–46.0)
Hemoglobin: 10.7 g/dL — ABNORMAL LOW (ref 12.0–15.0)
MCH: 29.2 pg (ref 26.0–34.0)
MCHC: 33.1 g/dL (ref 30.0–36.0)
MCV: 88.3 fL (ref 80.0–100.0)
Platelets: 282 10*3/uL (ref 150–400)
RBC: 3.66 MIL/uL — ABNORMAL LOW (ref 3.87–5.11)
RDW: 14.4 % (ref 11.5–15.5)
WBC: 4.4 10*3/uL (ref 4.0–10.5)
nRBC: 0 % (ref 0.0–0.2)

## 2021-09-13 LAB — BRAIN NATRIURETIC PEPTIDE: B Natriuretic Peptide: 304 pg/mL — ABNORMAL HIGH (ref 0.0–100.0)

## 2021-09-13 LAB — GLUCOSE, CAPILLARY
Glucose-Capillary: 150 mg/dL — ABNORMAL HIGH (ref 70–99)
Glucose-Capillary: 294 mg/dL — ABNORMAL HIGH (ref 70–99)

## 2021-09-13 LAB — HEMOGLOBIN A1C
Hgb A1c MFr Bld: 9.4 % — ABNORMAL HIGH (ref 4.8–5.6)
Mean Plasma Glucose: 223.08 mg/dL

## 2021-09-13 LAB — TROPONIN I (HIGH SENSITIVITY)
Troponin I (High Sensitivity): 6 ng/L (ref <18)
Troponin I (High Sensitivity): 6 ng/L (ref <18)

## 2021-09-13 LAB — RAPID URINE DRUG SCREEN, HOSP PERFORMED
Amphetamines: NOT DETECTED
Barbiturates: NOT DETECTED
Benzodiazepines: NOT DETECTED
Cocaine: NOT DETECTED
Opiates: POSITIVE — AB
Tetrahydrocannabinol: NOT DETECTED

## 2021-09-13 MED ORDER — INSULIN ASPART 100 UNIT/ML IJ SOLN
0.0000 [IU] | Freq: Three times a day (TID) | INTRAMUSCULAR | Status: DC
  Administered 2021-09-14 (×2): 1 [IU] via SUBCUTANEOUS

## 2021-09-13 MED ORDER — NITROGLYCERIN 0.4 MG SL SUBL: 0.4000 mg | SUBLINGUAL_TABLET | SUBLINGUAL | Status: DC | PRN

## 2021-09-13 MED ORDER — AMLODIPINE BESYLATE 10 MG PO TABS
10.0000 mg | ORAL_TABLET | Freq: Every day | ORAL | Status: DC
  Administered 2021-09-13 – 2021-09-15 (×3): 10 mg via ORAL
  Filled 2021-09-13: qty 2
  Filled 2021-09-13: qty 1
  Filled 2021-09-13: qty 2

## 2021-09-13 MED ORDER — HYDROCHLOROTHIAZIDE 25 MG PO TABS
25.0000 mg | ORAL_TABLET | Freq: Every day | ORAL | Status: DC
Start: 2021-09-14 — End: 2021-09-13

## 2021-09-13 MED ORDER — INSULIN GLARGINE-YFGN 100 UNIT/ML ~~LOC~~ SOLN
25.0000 [IU] | Freq: Every morning | SUBCUTANEOUS | Status: DC
  Filled 2021-09-13: qty 0.25

## 2021-09-13 MED ORDER — HYDRALAZINE HCL 20 MG/ML IJ SOLN: 10.0000 mg | INTRAMUSCULAR | Status: DC | PRN

## 2021-09-13 MED ORDER — ONDANSETRON HCL 4 MG/2ML IJ SOLN: 4.0000 mg | Freq: Four times a day (QID) | INTRAMUSCULAR | Status: DC | PRN

## 2021-09-13 MED ORDER — LOSARTAN POTASSIUM-HCTZ 100-25 MG PO TABS: 1.0000 | ORAL_TABLET | Freq: Every day | ORAL | Status: DC

## 2021-09-13 MED ORDER — FUROSEMIDE 10 MG/ML IJ SOLN
INTRAMUSCULAR | Status: AC
  Filled 2021-09-13: qty 2

## 2021-09-13 MED ORDER — CLONIDINE HCL ER 0.1 MG PO TB12
0.1000 mg | ORAL_TABLET | Freq: Every day | ORAL | Status: DC
  Administered 2021-09-13 – 2021-09-14 (×2): 0.1 mg via ORAL
  Filled 2021-09-13: qty 2
  Filled 2021-09-13 (×4): qty 1

## 2021-09-13 MED ORDER — ASPIRIN EC 81 MG PO TBEC
81.0000 mg | DELAYED_RELEASE_TABLET | Freq: Every day | ORAL | Status: DC
  Administered 2021-09-14 – 2021-09-15 (×2): 81 mg via ORAL
  Filled 2021-09-13 (×2): qty 1

## 2021-09-13 MED ORDER — ASPIRIN 325 MG PO TABS
325.0000 mg | ORAL_TABLET | Freq: Once | ORAL | Status: AC
  Administered 2021-09-13: 325 mg via ORAL
  Filled 2021-09-13: qty 1

## 2021-09-13 MED ORDER — INSULIN GLARGINE-YFGN 100 UNIT/ML ~~LOC~~ SOLN
25.0000 [IU] | Freq: Every morning | SUBCUTANEOUS | Status: DC
  Administered 2021-09-13 – 2021-09-15 (×3): 25 [IU] via SUBCUTANEOUS
  Filled 2021-09-13 (×4): qty 0.25

## 2021-09-13 MED ORDER — IOHEXOL 350 MG/ML SOLN
100.0000 mL | Freq: Once | INTRAVENOUS | Status: AC | PRN
  Administered 2021-09-13: 100 mL via INTRAVENOUS

## 2021-09-13 MED ORDER — ACETAMINOPHEN 325 MG PO TABS
650.0000 mg | ORAL_TABLET | Freq: Four times a day (QID) | ORAL | Status: DC | PRN
  Administered 2021-09-13: 650 mg via ORAL
  Filled 2021-09-13: qty 2

## 2021-09-13 MED ORDER — LOSARTAN POTASSIUM 50 MG PO TABS
100.0000 mg | ORAL_TABLET | Freq: Every day | ORAL | Status: DC
Start: 2021-09-14 — End: 2021-09-13

## 2021-09-13 MED ORDER — INSULIN GLARGINE-LIXISENATIDE 100-33 UNT-MCG/ML ~~LOC~~ SOPN: 25.0000 [IU] | PEN_INJECTOR | Freq: Every morning | SUBCUTANEOUS | Status: DC

## 2021-09-13 MED ORDER — INSULIN ASPART 100 UNIT/ML IJ SOLN
0.0000 [IU] | Freq: Every day | INTRAMUSCULAR | Status: DC
  Administered 2021-09-13: 3 [IU] via SUBCUTANEOUS

## 2021-09-13 MED ORDER — FUROSEMIDE 10 MG/ML IJ SOLN
20.0000 mg | Freq: Once | INTRAMUSCULAR | Status: AC
  Administered 2021-09-13: 20 mg via INTRAVENOUS
  Filled 2021-09-13: qty 2

## 2021-09-13 MED ORDER — POLYETHYLENE GLYCOL 3350 17 G PO PACK: 17.0000 g | PACK | Freq: Every day | ORAL | Status: DC | PRN

## 2021-09-13 MED ORDER — ATORVASTATIN CALCIUM 10 MG PO TABS
10.0000 mg | ORAL_TABLET | Freq: Every evening | ORAL | Status: DC
  Administered 2021-09-13 – 2021-09-14 (×2): 10 mg via ORAL
  Filled 2021-09-13 (×2): qty 1

## 2021-09-13 MED ORDER — ACETAMINOPHEN 650 MG RE SUPP: 650.0000 mg | Freq: Four times a day (QID) | RECTAL | Status: DC | PRN

## 2021-09-13 MED ORDER — INFLUENZA VAC SPLIT QUAD 0.5 ML IM SUSY
0.5000 mL | PREFILLED_SYRINGE | INTRAMUSCULAR | Status: AC
  Administered 2021-09-14: 0.5 mL via INTRAMUSCULAR
  Filled 2021-09-13: qty 0.5

## 2021-09-13 MED ORDER — ENOXAPARIN SODIUM 40 MG/0.4ML IJ SOSY
40.0000 mg | PREFILLED_SYRINGE | INTRAMUSCULAR | Status: DC
  Administered 2021-09-13 – 2021-09-14 (×2): 40 mg via SUBCUTANEOUS
  Filled 2021-09-13 (×2): qty 0.4

## 2021-09-13 MED ORDER — MORPHINE SULFATE (PF) 4 MG/ML IV SOLN
4.0000 mg | Freq: Once | INTRAVENOUS | Status: AC
  Administered 2021-09-13: 4 mg via INTRAVENOUS
  Filled 2021-09-13: qty 1

## 2021-09-13 MED ORDER — ONDANSETRON HCL 4 MG PO TABS: 4.0000 mg | ORAL_TABLET | Freq: Four times a day (QID) | ORAL | Status: DC | PRN

## 2021-09-13 NOTE — Plan of Care (Signed)
  Problem: Education: Goal: Knowledge of General Education information will improve Description Including pain rating scale, medication(s)/side effects and non-pharmacologic comfort measures Outcome: Progressing   Problem: Health Behavior/Discharge Planning: Goal: Ability to manage health-related needs will improve Outcome: Progressing   

## 2021-09-13 NOTE — Consult Note (Signed)
Cardiology Consultation:   Patient ID: Alexis Landry; [id number]; December 02, 1970   Admit date: 09/13/2021 Date of Consult: 09/13/2021  Primary Care Provider: [name], PA-C Primary Cardiologist: [name], MD Primary Electrophysiologist: None   Patient Profile:   Alexis Landry is a 50 y.o. female with a history of hypertension, type 2 diabetes mellitus, hyperlipidemia, and rheumatoid arthritis who is being seen today for the evaluation of chest tightness and shortness of breath at the request of Dr. Eulis Foster.  History of Present Illness:   Ms. Alexis Landry presents to the [facility] ER reporting a 3-day history of intermittent chest tightness.  Initial episode woke her from sleep, she had to sit up in bed.  She had an episode at rest yesterday, and again today felt very short of breath just taking a shower and walking about in her house.  She felt like she was wheezing, never any cough, but definite sense of orthopnea and some minor ankle edema.  Her weight has increased about 10 pounds over the last several months.  She saw Dr. Harrington Challenger for follow-up in November, I reviewed the note.  She had an echocardiogram back in February that demonstrated LVEF 65 to 70% with mild LVH and indeterminate diastolic function, trivial pericardial effusion, and mild mitral regurgitation.  She has been treated for hypertension, does admit that she occasionally misses doses of her medications.  Also has been on Lasix intermittently.  No prior ischemic work-up noted on chart review.  Past Medical History:  Diagnosis Date   Diabetic neuropathy ([place])    DM (diabetes mellitus), type 2, uncontrolled    Epiretinal membrane, right    GERD (gastroesophageal reflux disease)    HTN (hypertension)    Hyperlipidemia    Macular edema, diabetic (HCC)    Rheumatoid arthritis ([place])    Vitamin D deficiency     Past Surgical History:  Procedure Laterality Date   COLONOSCOPY N/A 01/04/2017   Procedure: COLONOSCOPY;   Surgeon: Danie Binder, MD;  [facility];  Service: Endoscopy;  Laterality: N/A;  10:30am   KENALOG INJECTION Right 12/03/2018   Procedure: LUCENTIS 0.3MG  INJECTION;  Surgeon: Sherlynn Stalls, MD;  Location: [place];  Service: Ophthalmology;  Laterality: Right;   MEMBRANE PEEL Right 12/03/2018   Procedure: MEMBRANE PEEL;  Surgeon: Sherlynn Stalls, MD;  Location: [place];  Service: Ophthalmology;  Laterality: Right;   PARS PLANA VITRECTOMY Right 12/03/2018   Procedure: PARS PLANA VITRECTOMY WITH 25 GAUGE;  Surgeon: Sherlynn Stalls, MD;  Location: [place];  Service: Ophthalmology;  Laterality: Right;   PHOTOCOAGULATION WITH LASER Right 12/03/2018   Procedure: PHOTOCOAGULATION WITH LASER;  Surgeon: Sherlynn Stalls, MD;  Location: [place];  Service: Ophthalmology;  Laterality: Right;   TUBAL LIGATION       Outpatient Medications: No current facility-administered medications on file prior to encounter.   Current Outpatient Medications on File Prior to Encounter  Medication Sig Dispense Refill   amLODipine (NORVASC) 10 MG tablet Take 1 tablet (10 mg total) by mouth daily. 90 tablet 3   atorvastatin (LIPITOR) 10 MG tablet Take 10 mg by mouth every evening.      cloNIDine HCl (KAPVAY) 0.1 MG TB12 ER tablet Take 0.1 mg by mouth at bedtime.     FARXIGA 10 MG TABS tablet Take 10 mg by mouth every morning.     losartan-hydrochlorothiazide (HYZAAR) 100-25 MG tablet Take 1 tablet by mouth daily.     meloxicam (MOBIC) 15 MG tablet Take 15  mg by mouth daily as needed for pain.     metFORMIN (GLUCOPHAGE-XR) 500 MG 24 hr tablet Take 1,000 mg by mouth in the morning and at bedtime.     ofloxacin (OCUFLOX) 0.3 % ophthalmic solution Place 1 drop into both eyes every 4 (four) hours.     omeprazole (PRILOSEC) 20 MG capsule Take 20 mg by mouth daily.     SOLIQUA 100-33 UNT-MCG/ML SOPN Inject 40 Units into the skin in the morning.     albuterol (VENTOLIN HFA) 108 (90 Base) MCG/ACT inhaler Inhale 1-2 puffs into the lungs  every 6 (six) hours as needed for shortness of breath. (Patient not taking: Reported on 08/09/2021) 18 g 0   BESIVANCE 0.6 % SUSP Place 1 drop into the right eye 3 (three) times daily. (Patient not taking: Reported on 08/09/2021)     cyclobenzaprine (FLEXERIL) 10 MG tablet Take 1 tablet (10 mg total) by mouth 2 (two) times daily as needed for muscle spasms. (Patient not taking: Reported on 08/09/2021) 20 tablet 0   DUREZOL 0.05 % EMUL Place 1 drop into the right eye 3 (three) times daily. (Patient not taking: Reported on 08/09/2021)     furosemide (LASIX) 20 MG tablet Take 20 mg by mouth daily. (Patient not taking: Reported on 09/13/2021)     JARDIANCE 25 MG TABS tablet Take 25 mg by mouth every morning. (Patient not taking: Reported on 08/09/2021)     naproxen (NAPROSYN) 375 MG tablet Take 1 tablet (375 mg total) by mouth 2 (two) times daily. (Patient not taking: Reported on 08/09/2021) 20 tablet 0   PROLENSA 0.07 % SOLN Place 1 drop into the right eye at bedtime. (Patient not taking: Reported on 08/09/2021)     triamcinolone (KENALOG) 0.1 % Apply 1 application topically See admin instructions. 1 application 2 to 3 times daily (Patient not taking: Reported on 09/13/2021)        Allergies:    Allergies  Allergen Reactions   Lisinopril Anaphylaxis    Throat swelling    Sulfa Antibiotics Itching    Social History:   Social History   Tobacco Use   Smoking status: Never   Smokeless tobacco: Never  Substance Use Topics   Alcohol use: Yes    Alcohol/week: 0.0 standard drinks    Comment: two times per month     Family History:   The patient's family history includes Diabetes in her maternal grandfather, paternal grandfather, and paternal grandmother; Heart disease in her mother; Kidney disease in her father. There is no history of Colon cancer.  ROS:  Please see the history of present illness.  No palpitations or syncope.  All other ROS reviewed and negative.     Physical Exam/Data:    Vitals:   09/13/21 1200 09/13/21 1230 09/13/21 1300 09/13/21 1330  BP: (!) 183/90 (!) 191/89 (!) 178/85 (!) 185/91  Pulse: (!) 32 (!) 58 (!) 57 65  Resp: 20 11 15 20   Temp:      TempSrc:      SpO2: 94% 100% 99% 100%  Weight:      Height:       No intake or output data in the 24 hours ending 09/13/21 1514 Filed Weights   09/13/21 0848  Weight: 104.3 kg   Body mass index is 37.12 kg/m.   Gen: Patient appears comfortable at rest. HEENT: Conjunctiva and lids normal, oropharynx clear with moist mucosa. Neck: Supple, no elevated JVP or carotid bruits, no thyromegaly. Lungs: Clear to  auscultation, nonlabored breathing at rest. Cardiac: Regular rate and rhythm, no S3, 1/6 systolic murmur, no pericardial rub. Abdomen: Soft, nontender, bowel sounds present. Extremities: Trace ankle edema, distal pulses 2+. Skin: Warm and dry. Musculoskeletal: No kyphosis. Neuropsychiatric: Alert and oriented x3, affect grossly appropriate.  EKG:  An ECG dated 09/13/2021 was personally reviewed today and demonstrated:  Sinus rhythm with prolonged PR interval and borderline low voltage.  Telemetry:  I personally reviewed telemetry which shows sinus rhythm.  Relevant CV Studies:  Echocardiogram 11/10/2020:  1. Left ventricular ejection fraction, by estimation, is 65 to 70%. The  left ventricle has normal function. The left ventricle has no regional  wall motion abnormalities. There is mild left ventricular hypertrophy.  Left ventricular diastolic parameters  are indeterminate. Global longitudinal strain measurement low normal at  -16.4% and does not track myocardium completely, suspect higher actual  measurment.   2. Right ventricular systolic function is normal. The right ventricular  size is normal. There is normal pulmonary artery systolic pressure. The  estimated right ventricular systolic pressure is 54.0 mmHg.   3. Left atrial size was upper normal.   4. There is a trivial pericardial  effusion that is circumferential.   5. The mitral valve is grossly normal. Mild mitral valve regurgitation.   6. The aortic valve is tricuspid. Aortic valve regurgitation is not  visualized.   7. The inferior vena cava is normal in size with greater than 50%  respiratory variability, suggesting right atrial pressure of 3 mmHg.   Laboratory Data:  Chemistry Recent Labs  Lab 09/13/21 0936  NA 138  K 3.5  CL 111  CO2 26  GLUCOSE 181*  BUN 19  CREATININE 1.21*  CALCIUM 8.6*  GFRNONAA 54*  ANIONGAP 1*    Recent Labs  Lab 09/13/21 0936  PROT 6.7  ALBUMIN 3.2*  AST 18  ALT 18  ALKPHOS 48  BILITOT 1.2   Hematology Recent Labs  Lab 09/13/21 0936  WBC 4.4  RBC 3.66*  HGB 10.7*  HCT 32.3*  MCV 88.3  MCH 29.2  MCHC 33.1  RDW 14.4  PLT 282   Cardiac Enzymes Recent Labs  Lab 09/13/21 0936 09/13/21 1116  TROPONINIHS 6 6   BNP Recent Labs  Lab 09/13/21 0937  BNP 304.0*     Radiology/Studies:  DG Chest 2 View  Result Date: 09/13/2021 CLINICAL DATA:  Chest pain and shortness of breath, history hypertension, diabetes mellitus type II EXAM: CHEST - 2 VIEW COMPARISON:  09/01/2020 FINDINGS: Normal heart size, mediastinal contours, and pulmonary vascularity. Streaky opacities at RIGHT lung base which could represent atelectasis or infiltrate. Central peribronchial thickening. Remaining lungs clear. No pleural effusion or pneumothorax. Osseous structures unremarkable. IMPRESSION: Bronchitic changes with streaky opacities at RIGHT lung base question atelectasis versus infiltrate. Electronically Signed   By: Lavonia Dana M.D.   On: 09/13/2021 09:51    Assessment and Plan:   1.  Recent episodes of chest tightness, associated shortness of breath and orthopnea, also exertional fatigue as discussed above in a 50 year old woman with essential hypertension, type 2 diabetes mellitus, and hyperlipidemia.  High-sensitivity troponin I levels are normal at this point.  3 shows  probable atelectasis at the right lung base.  BNP 304.  Last LVEF was in the range of 65 to 70% as of February, she has not undergone any prior ischemic work-up.  2.  Essential hypertension, currently on Norvasc and Hyzaar as an outpatient.  She admits that she does miss medication doses .  3.  Type 2 diabetes mellitus, currently on Farxiga, Jardiance, and Glucophage XR.  She follows at [facility].  4.  Mixed hyperlipidemia by history, currently on Lipitor.  Patient is being admitted to the hospitalist service for further evaluation.  Would cycle cardiac enzymes and recheck ECG in the morning.  Also obtain follow-up echocardiogram for reevaluation of cardiac structure and function.  Although no definitive ACS by cardiac enzymes so far, symptoms are still concerning for unstable angina and potential ischemic etiology.  Would keep n.p.o. after midnight and we will determine next step in work-up from there.  Signed, Rozann Lesches, MD  09/13/2021 3:14 PM

## 2021-09-13 NOTE — ED Provider Notes (Signed)
[facility] EMERGENCY DEPARTMENT Provider Note   CSN: [id number] Arrival date & time: 09/13/21  4315     History Chief Complaint  Patient presents with   Chest Pain    Alexis Landry is a 50 y.o. female.  Patient with history of DM type II, GERD, hypertension, and hyperlipidemia presents today with chest pain and shortness of breath.  She describes it as a pressure located in the center of her chest, states it is worse at night, and does not radiate.  She states it is relieved with sitting on her chest wall.  She states that her symptoms initially awoke her from sleep and she had difficulty catching her breath and was wheezing.  She had to put multiple pillows underneath her head for relief.  She did not take any medications for symptoms.  She denies any fevers or chills or episodes of diaphoresis.  No nausea or vomiting.  No history of blood clots, no recent travel, surgeries, no oral estrogen or recent periods of being sedentary.  No hemoptysis.  No prior history of smoking, asthma, or COPD.  She does state that she has been more fatigued and short of breath with exertion over the past few days as well.  Of note, she was seen and worked up for similar symptoms on 09/01/2020 with unremarkable findings.  She was then referred to cardiology who performed cardiac stress testing and echocardiogram also without acute findings.  The history is provided by the patient. No language interpreter was used.  Chest Pain Associated symptoms: shortness of breath   Associated symptoms: no abdominal pain, no cough, no dizziness, no fever, no headache, no nausea, no numbness, no palpitations, no vomiting and no weakness       Past Medical History:  Diagnosis Date   Diabetic neuropathy ([place])    DM (diabetes mellitus), type 2, uncontrolled 02/24/2015   Epiretinal membrane, right    GERD (gastroesophageal reflux disease) 02/24/2015   HTN (hypertension) 02/24/2015   Hyperlipidemia 02/24/2015   Macular  edema, diabetic ([place])    Rheumatoid arthritis ([place]) 02/24/2015   Vitamin D deficiency disease 02/24/2015    Patient Active Problem List   Diagnosis Date Noted   Encounter for screening colonoscopy 12/19/2016   Constipation 12/19/2016   Vitamin D deficiency disease 02/24/2015   HTN (hypertension) 02/24/2015   Hyperlipidemia 02/24/2015   GERD (gastroesophageal reflux disease) 02/24/2015   DM (diabetes mellitus), type 2, uncontrolled 02/24/2015   Rheumatoid arthritis ([place]) 02/24/2015    Past Surgical History:  Procedure Laterality Date   COLONOSCOPY N/A 01/04/2017   Procedure: COLONOSCOPY;  Surgeon: Danie Binder, MD;  [facility];  Service: Endoscopy;  Laterality: N/A;  10:30am   KENALOG INJECTION Right 12/03/2018   Procedure: LUCENTIS 0.3MG  INJECTION;  Surgeon: Sherlynn Stalls, MD;  Location: [place];  Service: Ophthalmology;  Laterality: Right;   MEMBRANE PEEL Right 12/03/2018   Procedure: MEMBRANE PEEL;  Surgeon: Sherlynn Stalls, MD;  Location: [place];  Service: Ophthalmology;  Laterality: Right;   PARS PLANA VITRECTOMY Right 12/03/2018   Procedure: PARS PLANA VITRECTOMY WITH 25 GAUGE;  Surgeon: Sherlynn Stalls, MD;  Location: [place];  Service: Ophthalmology;  Laterality: Right;   PHOTOCOAGULATION WITH LASER Right 12/03/2018   Procedure: PHOTOCOAGULATION WITH LASER;  Surgeon: Sherlynn Stalls, MD;  Location: [place];  Service: Ophthalmology;  Laterality: Right;   TUBAL LIGATION       OB History     Gravida  5   Para  4  Term      Preterm      AB  1   Living  4      SAB      IAB      Ectopic      Multiple      Live Births              Family History  Problem Relation Age of Onset   Heart disease Mother    Kidney disease Father    Diabetes Maternal Grandfather    Diabetes Paternal Grandmother    Diabetes Paternal Grandfather    Colon cancer Neg Hx     Social History   Tobacco Use   Smoking status: Never   Smokeless tobacco: Never  Vaping Use    Vaping Use: Never used  Substance Use Topics   Alcohol use: Yes    Alcohol/week: 0.0 standard drinks    Comment: two times per month   Drug use: No    Home Medications Prior to Admission medications   Medication Sig Start Date End Date Taking? Authorizing Provider  albuterol (VENTOLIN HFA) 108 (90 Base) MCG/ACT inhaler Inhale 1-2 puffs into the lungs every 6 (six) hours as needed for shortness of breath. Patient not taking: Reported on 08/09/2021 08/31/20   Emerson Monte, FNP  amLODipine (NORVASC) 10 MG tablet Take 1 tablet (10 mg total) by mouth daily. 09/02/21   Fay Records, MD  atorvastatin (LIPITOR) 10 MG tablet Take 10 mg by mouth every evening.     [provider]  BESIVANCE 0.6 % SUSP Place 1 drop into the right eye 3 (three) times daily. Patient not taking: Reported on 08/09/2021 09/07/19   [provider]  cloNIDine HCl (KAPVAY) 0.1 MG TB12 ER tablet Take 0.1 mg by mouth at bedtime.    [provider]  cyclobenzaprine (FLEXERIL) 10 MG tablet Take 1 tablet (10 mg total) by mouth 2 (two) times daily as needed for muscle spasms. Patient not taking: Reported on 08/09/2021 06/29/21   Dorie Rank, MD  DUREZOL 0.05 % EMUL Place 1 drop into the right eye 3 (three) times daily. Patient not taking: Reported on 08/09/2021 10/03/19   [provider]  FARXIGA 10 MG TABS tablet Take 10 mg by mouth every morning. 07/15/21   [provider]  furosemide (LASIX) 20 MG tablet Take 20 mg by mouth daily. 09/03/20   [provider]  JARDIANCE 25 MG TABS tablet Take 25 mg by mouth every morning. Patient not taking: Reported on 08/09/2021 10/30/20   [provider]  losartan-hydrochlorothiazide (HYZAAR) 100-25 MG tablet Take 1 tablet by mouth daily.    [provider]  meloxicam (MOBIC) 15 MG tablet Take 15 mg by mouth daily as needed for pain.    [provider]  metFORMIN (GLUCOPHAGE-XR) 500 MG 24 hr tablet Take by mouth.  10/30/20   [provider]  naproxen (NAPROSYN) 375 MG tablet Take 1 tablet (375 mg total) by mouth 2 (two) times daily. Patient not taking: Reported on 08/09/2021 06/29/21   Dorie Rank, MD  ofloxacin (OCUFLOX) 0.3 % ophthalmic solution Place 1 drop into both eyes every 4 (four) hours.    [provider]  omeprazole (PRILOSEC) 20 MG capsule Take 20 mg by mouth daily. 09/03/20   [provider]  PROLENSA 0.07 % SOLN Place 1 drop into the right eye at bedtime. Patient not taking: Reported on 08/09/2021 09/03/19   [provider]  Willeen Niece 100-33  UNT-MCG/ML SOPN Inject 40 Units into the skin in the morning. 08/19/20   [provider]  triamcinolone (KENALOG) 0.1 % Apply 1 application topically See admin instructions. 1 application 2 to 3 times daily 08/17/20   [provider]    Allergies    Lisinopril and Sulfa antibiotics  Review of Systems   Review of Systems  Constitutional:  Negative for chills and fever.  HENT:  Negative for congestion, rhinorrhea and sore throat.   Respiratory:  Positive for chest tightness, shortness of breath and wheezing. Negative for apnea, cough, choking and stridor.   Cardiovascular:  Positive for chest pain. Negative for palpitations and leg swelling.  Gastrointestinal:  Negative for abdominal pain, diarrhea, nausea and vomiting.  Skin:  Negative for rash.  Neurological:  Negative for dizziness, tremors, seizures, syncope, facial asymmetry, speech difficulty, weakness, light-headedness, numbness and headaches.  Psychiatric/Behavioral:  Negative for confusion and decreased concentration.   All other systems reviewed and are negative.  Physical Exam Updated Vital Signs BP (!) 179/78 (BP Location: Left Arm)   Pulse 61   Temp 97.9 F (36.6 C) (Oral)   Resp 15   Ht 5\' 6"  (1.676 m)   Wt 104.3 kg   LMP 09/19/2016 (Approximate)   SpO2 98%   BMI 37.12 kg/m   Physical Exam Vitals and nursing note reviewed.   Constitutional:      Appearance: She is well-developed. She is obese.     Comments: Patient resting comfortably in bed in no acute distress  HENT:     Head: Normocephalic and atraumatic.  Eyes:     Extraocular Movements: Extraocular movements intact.     Pupils: Pupils are equal, round, and reactive to light.  Cardiovascular:     Rate and Rhythm: Normal rate and regular rhythm.     Pulses:          Radial pulses are 2+ on the right side and 2+ on the left side.     Heart sounds: Normal heart sounds.  Pulmonary:     Effort: Pulmonary effort is normal. No tachypnea, accessory muscle usage or respiratory distress.     Breath sounds: Normal breath sounds. No stridor. No wheezing.  Chest:     Chest wall: No mass, tenderness or edema.  Abdominal:     General: Bowel sounds are normal.     Palpations: Abdomen is soft.  Musculoskeletal:        General: Normal range of motion.     Cervical back: Normal range of motion.     Right lower leg: No tenderness. No edema.     Left lower leg: No tenderness. No edema.  Skin:    General: Skin is warm and dry.  Neurological:     General: No focal deficit present.     Mental Status: She is alert.  Psychiatric:        Mood and Affect: Mood normal.        Behavior: Behavior normal.    ED Results / Procedures / Treatments   Labs (all labs ordered are listed, but only abnormal results are displayed) Labs Reviewed  CBC - Abnormal; Notable for the following components:      Result Value   RBC 3.66 (*)    Hemoglobin 10.7 (*)    HCT 32.3 (*)    All other components within normal limits  COMPREHENSIVE METABOLIC PANEL - Abnormal; Notable for the following components:   Glucose, Bld 181 (*)    Creatinine, Ser 1.21 (*)  Calcium 8.6 (*)    Albumin 3.2 (*)    GFR, Estimated 54 (*)    Anion gap 1 (*)    All other components within normal limits  BRAIN NATRIURETIC PEPTIDE - Abnormal; Notable for the following components:   B Natriuretic Peptide  304.0 (*)    All other components within normal limits  GLUCOSE, CAPILLARY - Abnormal; Notable for the following components:   Glucose-Capillary 150 (*)    All other components within normal limits  D-DIMER, QUANTITATIVE - Abnormal; Notable for the following components:   D-Dimer, Quant 1.30 (*)    All other components within normal limits  HIV ANTIBODY (ROUTINE TESTING W REFLEX)  RAPID URINE DRUG SCREEN, HOSP PERFORMED  HEMOGLOBIN A1C  TROPONIN I (HIGH SENSITIVITY)  TROPONIN I (HIGH SENSITIVITY)    EKG EKG Interpretation  Date/Time:  Monday September 13 2021 08:51:11 EST Ventricular Rate:  62 PR Interval:  233 QRS Duration: 82 QT Interval:  443 QTC Calculation: 450 R Axis:   51 Text Interpretation: Sinus rhythm Prolonged PR interval Low voltage, precordial leads Since last tracing PR is longer Otherwise no significant change Confirmed by Daleen Bo [phone]) on 09/13/2021 10:38:20 AM  Radiology DG Chest 2 View  Result Date: 09/13/2021 CLINICAL DATA:  Chest pain and shortness of breath, history hypertension, diabetes mellitus type II EXAM: CHEST - 2 VIEW COMPARISON:  09/01/2020 FINDINGS: Normal heart size, mediastinal contours, and pulmonary vascularity. Streaky opacities at RIGHT lung base which could represent atelectasis or infiltrate. Central peribronchial thickening. Remaining lungs clear. No pleural effusion or pneumothorax. Osseous structures unremarkable. IMPRESSION: Bronchitic changes with streaky opacities at RIGHT lung base question atelectasis versus infiltrate. Electronically Signed   By: Lavonia Dana M.D.   On: 09/13/2021 09:51    Procedures Procedures   Medications Ordered in ED Medications  influenza vac split quadrivalent PF (FLUARIX) injection 0.5 mL (has no administration in time range)  insulin aspart (novoLOG) injection 0-9 Units (has no administration in time range)  insulin aspart (novoLOG) injection 0-5 Units (has no administration in time range)   enoxaparin (LOVENOX) injection 40 mg (has no administration in time range)  acetaminophen (TYLENOL) tablet 650 mg (has no administration in time range)    Or  acetaminophen (TYLENOL) suppository 650 mg (has no administration in time range)  ondansetron (ZOFRAN) tablet 4 mg (has no administration in time range)    Or  ondansetron (ZOFRAN) injection 4 mg (has no administration in time range)  polyethylene glycol (MIRALAX / GLYCOLAX) packet 17 g (has no administration in time range)  amLODipine (NORVASC) tablet 10 mg (has no administration in time range)  atorvastatin (LIPITOR) tablet 10 mg (has no administration in time range)  cloNIDine HCl (KAPVAY) ER tablet 0.1 mg (has no administration in time range)  Insulin Glargine-Lixisenatide 100-33 UNT-MCG/ML SOPN 25 Units (has no administration in time range)  losartan (COZAAR) tablet 100 mg (has no administration in time range)    And  hydrochlorothiazide (HYDRODIURIL) tablet 25 mg (has no administration in time range)  furosemide (LASIX) injection 20 mg ( Intravenous Canceled Entry 09/13/21 1550)  morphine 4 MG/ML injection 4 mg (4 mg Intravenous Given 09/13/21 1534)    ED Course  I have reviewed the triage vital signs and the nursing notes.  Pertinent labs & imaging results that were available during my care of the patient were reviewed by me and considered in my medical decision making (see chart for details).    MDM Rules/Calculators/A&P  Patient presents today with intractable chest pressure over the past 3 days which is worse at night and awakes her from sleep with shortness of breath.  CBC without leukocytosis. Anemia unchanged from baseline. Troponins flat and negative. Creatinine elevation per baseline. BNP at 304. CXR with streaky opacities at the right lung base, question atelectasis vs inflitrate.   Some increased DOE over the past few days as well without any URI symptoms or cough.   Of note, she has  also been treated in the past with Lasix, however was stopped at the beginning of November by cardiology. Echo in February with EF 65-70%.  Discussed care with on call cardiology given high risk patient and Heart score of 4 who recommended admission to medicine for ischemic workup and monitoring for changes. Patient still endorsing chest pain at this time. Discussed this with patient who is amenable with plans of admission.  Discussed patient with hospitalist who agrees to admit.   Final Clinical Impression(s) / ED Diagnoses Final diagnoses:  Chest pain, unspecified type    Rx / DC Orders ED Discharge Orders     None        Nestor Lewandowsky 09/13/21 2015    Daleen Bo, MD 09/14/21 1115

## 2021-09-13 NOTE — H&P (Addendum)
History and Physical    Alexis Landry:[id number] DOB: 17-Sep-1971 DOA: 09/13/2021  PCP: [name], PA-C   Patient coming from: home  I have personally briefly reviewed patient's old medical records in [place]  Chief Complaint: chest pain  HPI: Alexis Landry is a 50 y.o. female with medical history significant for diabetes mellitus, hypertension, rheumatoid arthritis. Patient presented to the ED with complaints of persistent chest pains described as pressure-like over the past 3 days.  She reports she was resting/sleeping when chest pain started and woke her up from sleep.  She reports worsening of chest pain with difficulty breathing with activity.  She also reports wheezing.  She never smoked cigarettes.  Chest pain is located at the center of her chest and nonradiating. Chest pain improved when she got to the ED after she was given morphine. History of heart attack in her mother when she was 63. She denies cough or difficulty breathing.  Reports some mild swelling in the ankles.  No personal or family history of blood clots in lungs or legs.  No recent surgeries, no recent trips.  ED Course: Stable vitals.  Troponin 6 x 2.  Potassium 3.5.  Blood glucose 181.  Creatinine 1.2.  Chest x-ray shows bronchitic changes with streaky opacities at right lung base atelectasis versus infiltrate. Cardiology was consulted in the ED, recommended admission, cycle troponins, recheck EKG in the morning, follow-up echo.  Review of Systems: As per HPI all other systems reviewed and negative.  Past Medical History:  Diagnosis Date   Diabetic neuropathy ([place])    DM (diabetes mellitus), type 2, uncontrolled    Epiretinal membrane, right    GERD (gastroesophageal reflux disease)    HTN (hypertension)    Hyperlipidemia    Macular edema, diabetic ([facility])    Rheumatoid arthritis ([place])    Vitamin D deficiency     Past Surgical History:  Procedure Laterality Date   COLONOSCOPY  N/A 01/04/2017   Procedure: COLONOSCOPY;  Surgeon: Danie Binder, MD;  [facility];  Service: Endoscopy;  Laterality: N/A;  10:30am   KENALOG INJECTION Right 12/03/2018   Procedure: LUCENTIS 0.3MG  INJECTION;  Surgeon: Sherlynn Stalls, MD;  Location: [place];  Service: Ophthalmology;  Laterality: Right;   MEMBRANE PEEL Right 12/03/2018   Procedure: MEMBRANE PEEL;  Surgeon: Sherlynn Stalls, MD;  Location: [place];  Service: Ophthalmology;  Laterality: Right;   PARS PLANA VITRECTOMY Right 12/03/2018   Procedure: PARS PLANA VITRECTOMY WITH 25 GAUGE;  Surgeon: Sherlynn Stalls, MD;  Location: [place];  Service: Ophthalmology;  Laterality: Right;   PHOTOCOAGULATION WITH LASER Right 12/03/2018   Procedure: PHOTOCOAGULATION WITH LASER;  Surgeon: Sherlynn Stalls, MD;  Location: [place];  Service: Ophthalmology;  Laterality: Right;   TUBAL LIGATION       reports that she has never smoked. She has never used smokeless tobacco. She reports current alcohol use. She reports that she does not use drugs.  Allergies  Allergen Reactions   Lisinopril Anaphylaxis    Throat swelling    Sulfa Antibiotics Itching    Family History  Problem Relation Age of Onset   Heart disease Mother    Kidney disease Father    Diabetes Maternal Grandfather    Diabetes Paternal Grandmother    Diabetes Paternal Grandfather    Colon cancer Neg Hx    Prior to Admission medications   Medication Sig Start Date End Date Taking? Authorizing Provider  amLODipine (NORVASC) 10 MG tablet  Take 1 tablet (10 mg total) by mouth daily. 09/02/21  Yes Fay Records, MD  atorvastatin (LIPITOR) 10 MG tablet Take 10 mg by mouth every evening.    Yes [provider]  cloNIDine HCl (KAPVAY) 0.1 MG TB12 ER tablet Take 0.1 mg by mouth at bedtime.   Yes [provider]  FARXIGA 10 MG TABS tablet Take 10 mg by mouth every morning. 07/15/21  Yes [provider]  losartan-hydrochlorothiazide (HYZAAR) 100-25 MG tablet Take 1 tablet  by mouth daily.   Yes [provider]  meloxicam (MOBIC) 15 MG tablet Take 15 mg by mouth daily as needed for pain.   Yes [provider]  metFORMIN (GLUCOPHAGE-XR) 500 MG 24 hr tablet Take 1,000 mg by mouth in the morning and at bedtime. 10/30/20  Yes [provider]  ofloxacin (OCUFLOX) 0.3 % ophthalmic solution Place 1 drop into both eyes every 4 (four) hours.   Yes [provider]  omeprazole (PRILOSEC) 20 MG capsule Take 20 mg by mouth daily. 09/03/20  Yes [provider]  SOLIQUA 100-33 UNT-MCG/ML SOPN Inject 40 Units into the skin in the morning. 08/19/20  Yes [provider]  albuterol (VENTOLIN HFA) 108 (90 Base) MCG/ACT inhaler Inhale 1-2 puffs into the lungs every 6 (six) hours as needed for shortness of breath. Patient not taking: Reported on 08/09/2021 08/31/20   Avegno, Darrelyn Hillock, FNP  BESIVANCE 0.6 % SUSP Place 1 drop into the right eye 3 (three) times daily. Patient not taking: Reported on 08/09/2021 09/07/19   [provider]  cyclobenzaprine (FLEXERIL) 10 MG tablet Take 1 tablet (10 mg total) by mouth 2 (two) times daily as needed for muscle spasms. Patient not taking: Reported on 08/09/2021 06/29/21   Dorie Rank, MD  DUREZOL 0.05 % EMUL Place 1 drop into the right eye 3 (three) times daily. Patient not taking: Reported on 08/09/2021 10/03/19   [provider]  furosemide (LASIX) 20 MG tablet Take 20 mg by mouth daily. Patient not taking: Reported on 09/13/2021 09/03/20   [provider]  JARDIANCE 25 MG TABS tablet Take 25 mg by mouth every morning. Patient not taking: Reported on 08/09/2021 10/30/20   [provider]  naproxen (NAPROSYN) 375 MG tablet Take 1 tablet (375 mg total) by mouth 2 (two) times daily. Patient not taking: Reported on 08/09/2021 06/29/21   Dorie Rank, MD  PROLENSA 0.07 % SOLN Place 1 drop into the right eye at bedtime. Patient not taking: Reported on 08/09/2021 09/03/19    [provider]  triamcinolone (KENALOG) 0.1 % Apply 1 application topically See admin instructions. 1 application 2 to 3 times daily Patient not taking: Reported on 09/13/2021 08/17/20   [provider]    Physical Exam: Vitals:   09/13/21 1230 09/13/21 1300 09/13/21 1330 09/13/21 1534  BP: (!) 191/89 (!) 178/85 (!) 185/91 (!) 180/73  Pulse: (!) 58 (!) 57 65 60  Resp: 11 15 20 19   Temp:      TempSrc:      SpO2: 100% 99% 100% 100%  Weight:      Height:        Constitutional: NAD, calm, comfortable Vitals:   09/13/21 1230 09/13/21 1300 09/13/21 1330 09/13/21 1534  BP: (!) 191/89 (!) 178/85 (!) 185/91 (!) 180/73  Pulse: (!) 58 (!) 57 65 60  Resp: 11 15 20 19   Temp:      TempSrc:      SpO2: 100% 99% 100% 100%  Weight:      Height:       Eyes: PERRL, lids and conjunctivae normal ENMT: Mucous membranes are moist. .  Neck: normal, supple, no masses, no thyromegaly Respiratory: clear to auscultation bilaterally, no wheezing, no crackles. Normal respiratory effort. No accessory muscle use.  Cardiovascular: Regular rate and rhythm, no murmurs / rubs / gallops.  Minimal/ trace extremity swelling around ankles.  2+ pedal pulses.   Abdomen: no tenderness, no masses palpated. No hepatosplenomegaly. Bowel sounds positive.  Musculoskeletal: no clubbing / cyanosis. No joint deformity upper and lower extremities. Good ROM, no contractures. Normal muscle tone.  Skin: no rashes, lesions, ulcers. No induration Neurologic: No apparent cranial nerve abnormality moving extremities spontaneously. Psychiatric: Normal judgment and insight. Alert and oriented x 3. Normal mood.   Labs on Admission: I have personally reviewed following labs and imaging studies  CBC: Recent Labs  Lab 09/13/21 0936  WBC 4.4  HGB 10.7*  HCT 32.3*  MCV 88.3  PLT 604   Basic Metabolic Panel: Recent Labs  Lab 09/13/21 0936  NA 138  K 3.5  CL 111  CO2 26  GLUCOSE 181*  BUN 19  CREATININE  1.21*  CALCIUM 8.6*   GFR: Estimated Creatinine Clearance: 66.4 mL/min (A) (by C-G formula based on SCr of 1.21 mg/dL (H)). Liver Function Tests: Recent Labs  Lab 09/13/21 0936  AST 18  ALT 18  ALKPHOS 48  BILITOT 1.2  PROT 6.7  ALBUMIN 3.2*   BNP (last 3 results) Recent Labs    10/23/20 1145  PROBNP 353*    Radiological Exams on Admission: DG Chest 2 View  Result Date: 09/13/2021 CLINICAL DATA:  Chest pain and shortness of breath, history hypertension, diabetes mellitus type II EXAM: CHEST - 2 VIEW COMPARISON:  09/01/2020 FINDINGS: Normal heart size, mediastinal contours, and pulmonary vascularity. Streaky opacities at RIGHT lung base which could represent atelectasis or infiltrate. Central peribronchial thickening. Remaining lungs clear. No pleural effusion or pneumothorax. Osseous structures unremarkable. IMPRESSION: Bronchitic changes with streaky opacities at RIGHT lung base question atelectasis versus infiltrate. Electronically Signed   By: Lavonia Dana M.D.   On: 09/13/2021 09:51    EKG: Independently reviewed.  Sinus rhythm rate 62, QTc 450.  No significant changes from prior.  Assessment/Plan Principal Problem:   Chest pain Active Problems:   HTN (hypertension)   DM (diabetes mellitus) ([facility])   Rheumatoid arthritis ([place])  Chest pain-troponins 6x2.  EKG without significant ST or T wave changes.  High risk for ACS with hypertension and diabetes, also family history of CAD in mother at age 17.  Never smoker. -Cardiology consulted in the ED, trend troponins, echo, EKG in the morning - UDS -Nitroglycerin as needed- -Obtain D-dimer elevated 1.3, will get CTA -Aspirin, resume Lipitor 10 mg daily  Diabetes-random glucose 181. - SSI- S - HgbA1c -Resume home insulin at reduced dose 25 units daily -Hold Farxiga  Rheumatoid arthritis-stable.  Not on medications.  Hypertension-systolic 540J to 811B. -Resume Norvasc 10 - Hold losartan HCTZ 125 mg daily with contrast  exposure - Resume clonidine 0.1 mg at bedtime - PRN hydralazine 10mg    DVT prophylaxis: Lovenox Code Status: Full code Family Communication: None at bedside Disposition Plan: ~ 1 -2 days Consults called:  cardiology Admission status: Obs tele    Bethena Roys MD [facility]  09/13/2021, 3:42 PM

## 2021-09-13 NOTE — ED Triage Notes (Signed)
Pt arrived to triage complaining of chest pressure and shortness of breath x3 days. Shortness of breath gets worse with movement

## 2021-09-14 ENCOUNTER — Encounter (HOSPITAL_COMMUNITY)
Admission: EM | Disposition: A | Discharge status: Home / Self Care | Payer: Self-pay | Source: Home / Self Care | Attending: Emergency Medicine

## 2021-09-14 ENCOUNTER — Observation Stay (HOSPITAL_BASED_OUTPATIENT_CLINIC_OR_DEPARTMENT_OTHER): Payer: BC Managed Care – PPO

## 2021-09-14 DIAGNOSIS — I10 Essential (primary) hypertension: Secondary | ICD-10-CM | POA: Diagnosis not present

## 2021-09-14 DIAGNOSIS — E785 Hyperlipidemia, unspecified: Secondary | ICD-10-CM | POA: Diagnosis not present

## 2021-09-14 DIAGNOSIS — Z79899 Other long term (current) drug therapy: Secondary | ICD-10-CM | POA: Diagnosis not present

## 2021-09-14 DIAGNOSIS — Z20822 Contact with and (suspected) exposure to covid-19: Secondary | ICD-10-CM | POA: Diagnosis not present

## 2021-09-14 DIAGNOSIS — Z23 Encounter for immunization: Secondary | ICD-10-CM | POA: Diagnosis not present

## 2021-09-14 DIAGNOSIS — I200 Unstable angina: Secondary | ICD-10-CM | POA: Diagnosis not present

## 2021-09-14 DIAGNOSIS — R079 Chest pain, unspecified: Secondary | ICD-10-CM | POA: Diagnosis not present

## 2021-09-14 DIAGNOSIS — I2510 Atherosclerotic heart disease of native coronary artery without angina pectoris: Secondary | ICD-10-CM

## 2021-09-14 DIAGNOSIS — E1169 Type 2 diabetes mellitus with other specified complication: Secondary | ICD-10-CM | POA: Diagnosis not present

## 2021-09-14 DIAGNOSIS — E119 Type 2 diabetes mellitus without complications: Secondary | ICD-10-CM | POA: Diagnosis not present

## 2021-09-14 DIAGNOSIS — Z7984 Long term (current) use of oral hypoglycemic drugs: Secondary | ICD-10-CM | POA: Diagnosis not present

## 2021-09-14 DIAGNOSIS — I25110 Atherosclerotic heart disease of native coronary artery with unstable angina pectoris: Secondary | ICD-10-CM | POA: Diagnosis not present

## 2021-09-14 HISTORY — PX: LEFT HEART CATH AND CORONARY ANGIOGRAPHY: CATH118249

## 2021-09-14 LAB — LIPID PANEL
Cholesterol: 175 mg/dL (ref 0–200)
HDL: 55 mg/dL (ref >40)
LDL Cholesterol: 100 mg/dL — ABNORMAL HIGH (ref 0–99)
Total CHOL/HDL Ratio: 3.2 RATIO
Triglycerides: 102 mg/dL (ref <150)
VLDL: 20 mg/dL (ref 0–40)

## 2021-09-14 LAB — BASIC METABOLIC PANEL
Anion gap: 8 (ref 5–15)
BUN: 19 mg/dL (ref 6–20)
CO2: 26 mmol/L (ref 22–32)
Calcium: 8.8 mg/dL — ABNORMAL LOW (ref 8.9–10.3)
Chloride: 105 mmol/L (ref 98–111)
Creatinine, Ser: 1.32 mg/dL — ABNORMAL HIGH (ref 0.44–1.00)
GFR, Estimated: 49 mL/min — ABNORMAL LOW (ref >60)
Glucose, Bld: 166 mg/dL — ABNORMAL HIGH (ref 70–99)
Potassium: 3.3 mmol/L — ABNORMAL LOW (ref 3.5–5.1)
Sodium: 139 mmol/L (ref 135–145)

## 2021-09-14 LAB — ECHOCARDIOGRAM COMPLETE
Area-P 1/2: 3.42 cm2
Height: 66 in
S' Lateral: 2.4 cm
Weight: 3723.2 oz

## 2021-09-14 LAB — GLUCOSE, CAPILLARY
Glucose-Capillary: 141 mg/dL — ABNORMAL HIGH (ref 70–99)
Glucose-Capillary: 139 mg/dL — ABNORMAL HIGH (ref 70–99)
Glucose-Capillary: 89 mg/dL (ref 70–99)
Glucose-Capillary: 66 mg/dL — ABNORMAL LOW (ref 70–99)
Glucose-Capillary: 67 mg/dL — ABNORMAL LOW (ref 70–99)
Glucose-Capillary: 139 mg/dL — ABNORMAL HIGH (ref 70–99)
Glucose-Capillary: 170 mg/dL — ABNORMAL HIGH (ref 70–99)

## 2021-09-14 LAB — HIV ANTIBODY (ROUTINE TESTING W REFLEX): HIV Screen 4th Generation wRfx: NONREACTIVE

## 2021-09-14 LAB — SARS CORONAVIRUS 2 BY RT PCR (HOSPITAL ORDER, PERFORMED IN ~~LOC~~ HOSPITAL LAB): SARS Coronavirus 2: NEGATIVE

## 2021-09-14 SURGERY — LEFT HEART CATH AND CORONARY ANGIOGRAPHY
Anesthesia: LOCAL

## 2021-09-14 MED ORDER — SODIUM CHLORIDE 0.9 % IV SOLN
INTRAVENOUS | Status: AC | PRN
  Administered 2021-09-14: 106 mL/h via INTRAVENOUS

## 2021-09-14 MED ORDER — FENTANYL CITRATE (PF) 100 MCG/2ML IJ SOLN
INTRAMUSCULAR | Status: AC
  Filled 2021-09-14: qty 2

## 2021-09-14 MED ORDER — HEPARIN SODIUM (PORCINE) 1000 UNIT/ML IJ SOLN
INTRAMUSCULAR | Status: AC
  Filled 2021-09-14: qty 10

## 2021-09-14 MED ORDER — SODIUM CHLORIDE 0.9% FLUSH: 3.0000 mL | INTRAVENOUS | Status: DC | PRN

## 2021-09-14 MED ORDER — SODIUM CHLORIDE 0.9% FLUSH
3.0000 mL | Freq: Two times a day (BID) | INTRAVENOUS | Status: DC
  Administered 2021-09-15: 10:00:00 3 mL via INTRAVENOUS

## 2021-09-14 MED ORDER — SODIUM CHLORIDE 0.9 % IV SOLN: 250.0000 mL | INTRAVENOUS | Status: DC | PRN

## 2021-09-14 MED ORDER — MIDAZOLAM HCL 2 MG/2ML IJ SOLN
INTRAMUSCULAR | Status: AC
  Filled 2021-09-14: qty 2

## 2021-09-14 MED ORDER — HEPARIN (PORCINE) IN NACL 1000-0.9 UT/500ML-% IV SOLN
INTRAVENOUS | Status: AC
  Filled 2021-09-14: qty 500

## 2021-09-14 MED ORDER — FENTANYL CITRATE (PF) 100 MCG/2ML IJ SOLN
INTRAMUSCULAR | Status: DC | PRN
  Administered 2021-09-14: 25 ug via INTRAVENOUS

## 2021-09-14 MED ORDER — DEXTROSE 50 % IV SOLN
INTRAVENOUS | Status: DC | PRN
  Administered 2021-09-14: 25 mL via INTRAVENOUS

## 2021-09-14 MED ORDER — SODIUM CHLORIDE 0.9 % WEIGHT BASED INFUSION: 3.0000 mL/kg/h | INTRAVENOUS | Status: DC

## 2021-09-14 MED ORDER — MIDAZOLAM HCL 2 MG/2ML IJ SOLN
INTRAMUSCULAR | Status: DC | PRN
  Administered 2021-09-14: 2 mg via INTRAVENOUS

## 2021-09-14 MED ORDER — ONDANSETRON HCL 4 MG/2ML IJ SOLN: 4.0000 mg | Freq: Four times a day (QID) | INTRAMUSCULAR | Status: DC | PRN

## 2021-09-14 MED ORDER — HEPARIN (PORCINE) IN NACL 1000-0.9 UT/500ML-% IV SOLN
INTRAVENOUS | Status: DC | PRN
  Administered 2021-09-14: 500 mL

## 2021-09-14 MED ORDER — DEXTROSE 50 % IV SOLN
INTRAVENOUS | Status: AC
  Filled 2021-09-14: qty 50

## 2021-09-14 MED ORDER — SODIUM CHLORIDE 0.9 % WEIGHT BASED INFUSION: 1.0000 mL/kg/h | INTRAVENOUS | Status: DC

## 2021-09-14 MED ORDER — LIDOCAINE HCL (PF) 1 % IJ SOLN
INTRAMUSCULAR | Status: DC | PRN
  Administered 2021-09-14: 2 mL

## 2021-09-14 MED ORDER — ACETAMINOPHEN 325 MG PO TABS
650.0000 mg | ORAL_TABLET | ORAL | Status: DC | PRN
  Administered 2021-09-15: 08:00:00 650 mg via ORAL
  Filled 2021-09-14: qty 2

## 2021-09-14 MED ORDER — SODIUM CHLORIDE 0.9 % IV SOLN: INTRAVENOUS | Status: AC

## 2021-09-14 MED ORDER — LABETALOL HCL 5 MG/ML IV SOLN: 10.0000 mg | INTRAVENOUS | Status: AC | PRN

## 2021-09-14 MED ORDER — HEPARIN SODIUM (PORCINE) 1000 UNIT/ML IJ SOLN
INTRAMUSCULAR | Status: DC | PRN
  Administered 2021-09-14: 5300 [IU] via INTRAVENOUS

## 2021-09-14 MED ORDER — VERAPAMIL HCL 2.5 MG/ML IV SOLN
INTRAVENOUS | Status: DC | PRN
  Administered 2021-09-14: 10 mL via INTRA_ARTERIAL

## 2021-09-14 MED ORDER — SODIUM BICARBONATE 8.4 % IV SOLN
INTRAVENOUS | Status: AC
  Filled 2021-09-14: qty 50

## 2021-09-14 MED ORDER — IOHEXOL 350 MG/ML SOLN
INTRAVENOUS | Status: DC | PRN
  Administered 2021-09-14: 50 mL

## 2021-09-14 MED ORDER — ASPIRIN 81 MG PO CHEW: 81.0000 mg | CHEWABLE_TABLET | ORAL | Status: DC

## 2021-09-14 MED ORDER — HYDRALAZINE HCL 20 MG/ML IJ SOLN: 10.0000 mg | INTRAMUSCULAR | Status: AC | PRN

## 2021-09-14 SURGICAL SUPPLY — 11 items
CATH INFINITI JR4 5F (CATHETERS) ×2 IMPLANT
CATH OPTITORQUE TIG 4.0 5F (CATHETERS) ×2 IMPLANT
DEVICE RAD COMP TR BAND LRG (VASCULAR PRODUCTS) ×2 IMPLANT
GLIDESHEATH SLEND SS 6F .021 (SHEATH) ×2 IMPLANT
GUIDEWIRE INQWIRE 1.5J.035X260 (WIRE) IMPLANT
INQWIRE 1.5J .035X260CM (WIRE) ×3
KIT HEART LEFT (KITS) ×3 IMPLANT
PACK CARDIAC CATHETERIZATION (CUSTOM PROCEDURE TRAY) ×3 IMPLANT
SHEATH PROBE COVER 6X72 (BAG) ×2 IMPLANT
TRANSDUCER W/STOPCOCK (MISCELLANEOUS) ×3 IMPLANT
TUBING CIL FLEX 10 FLL-RA (TUBING) ×3 IMPLANT

## 2021-09-14 NOTE — Progress Notes (Addendum)
Inpatient Diabetes Program Recommendations  AACE/ADA: New Consensus Statement on Inpatient Glycemic Control   Target Ranges:  Prepandial:   less than 140 mg/dL      Peak postprandial:   less than 180 mg/dL (1-2 hours)      Critically ill patients:  140 - 180 mg/dL    Latest Reference Range & Units 09/13/21 17:32 09/13/21 20:54  Glucose-Capillary 70 - 99 mg/dL 150 (H) 294 (H)    Latest Reference Range & Units 09/13/21 19:10  Hemoglobin A1C 4.8 - 5.6 % 9.4 (H)   Review of Glycemic Control  Diabetes history: DM2 Outpatient Diabetes medications: Farxiga 10 mg QAM, Jardiance 25 mg QAM (not taking per med list; PCP stopped and prescribed Farxiga about 3 months ago), Metformin XR 1000 mg BID, Soliqua 40 units QAM Current orders for Inpatient glycemic control: Semglee 25 units QAM, Novolog 0-9 units TID with meals, Novolog 0-5 units QHS  Inpatient Diabetes Program Recommendations:    Insulin: If post prandial glucose is consistently elevated, may want to consider ordering Novolog meal coverage insulin TID.  HbgA1C: A1C 9.4% on 09/13/21 indicating an average glucose of 223 mg/dl over the past 2-3 months. Patient reports glucose is usually in 200's and higher after eating. Patient would be willing to use short acting insulin with meals if prescribed at discharge.  NOTE: Diabetes coordinator working from [employer]. Tried to call patient's room phone and cell phone but no answer on either one. Will attempt to call patient again later today to discuss DM control and A1C.   Addendum 09/14/21@10 :109- Spoke with patient over the phone about diabetes and home regimen for diabetes control. Patient reports being followed by PCP for diabetes management and currently taking Farxiga 10 mg QAM, Jardiance 25 mg QAM (not taking per med list; PCP stopped and prescribed Wilder Glade about 3 months ago), Metformin XR 1000 mg BID, and Soliqua 40 units QAM as an outpatient for diabetes control. Patient reports taking DM  medications as prescribed reports that glucose is mostly in the 200's mg/dl and seems to always go higher after eating regardless of what she eats. Patient also notes that she has some hypoglycemia especially if she does not eat much. Patient reports that her last A1C was 8.5% (has been as high as 14%). Discussed A1C results (9.4% on 09/13/21) and explained that current A1C indicates an average glucose of 223 mg/dl over the past 2-3 months. Discussed glucose and A1C goals. Discussed importance of checking CBGs and maintaining good CBG control to prevent long-term and short-term complications. Explained how hyperglycemia leads to damage within blood vessels which lead to the common complications seen with uncontrolled diabetes. Patient is concerned about her DM control especially given issues she already has from complications. Inquired about ever seeing an Endocrinologist and patient states she has not ever had one. Informed patient that there is an Musician in [place] (Dr. Dorris Fetch). Patient is interested in getting established with Dr. Dorris Fetch.  Encouraged patient to check with insurance to see if she needed a referral from her PCP or if she could make an appointment without the referral. Will add contact information for Dr. Dorris Fetch to discharge paperwork. Patient states she has everything she needs at home for glucose monitoring and all DM medications.  Patient states that she wonders if she needs to be using short acting insulin with meals since her glucose is spiking so high after eating. Patient would be agreeable to take short acting insulin as an outpatient if prescribed at discharge.  Patient verbalized understanding of information discussed and reports no further questions at this time related to diabetes.  Thanks, Barnie Alderman, RN, MSN, CDE Diabetes Coordinator Inpatient Diabetes Program [phone] (Team Pager from 8am to 5pm)

## 2021-09-14 NOTE — Progress Notes (Signed)
*  PRELIMINARY RESULTS* Echocardiogram 2D Echocardiogram has been performed.  Alexis Landry 09/14/2021, 12:20 PM

## 2021-09-14 NOTE — TOC Progression Note (Signed)
°  Transition of Care [facility]) Screening Note   Patient Details  Name: [name] Date of Birth: Jun 05, 1971   Transition of Care [facility]) CM/SW Contact:    Ihor Gully, LCSW Phone Number: 09/14/2021, 10:53 AM    Transition of Care Department [facility]) has reviewed patient and no TOC needs have been identified at this time. We will continue to monitor patient advancement through interdisciplinary progression rounds. If new patient transition needs arise, please place a TOC consult.

## 2021-09-14 NOTE — Discharge Instructions (Signed)
Dr. Glade Lloyd Endocrinologist in [place], [facility] Address: [address] [place], [place],  [address] Phone: [phone]

## 2021-09-14 NOTE — Plan of Care (Signed)

## 2021-09-14 NOTE — Progress Notes (Signed)
09/14/2021 1:31 PM  Notified by Dr. Domenic Polite that patient is transferring to [facility] for cath today and will remain on cardiology service.  Sierra Brooks signing off at this time.  Patient Placement Coordinator Hazle Nordmann RN notified of changes.    Murvin Natal, MD  How to contact the [facility] Attending or Consulting provider [place] or covering provider during after hours [place], for this patient?  Check the care team in [facility] and look for a) attending/consulting TRH provider listed and b) the [facility] team listed Log into [email or web address] and use [place]'s universal password to access. If you do not have the password, please contact the hospital operator. Locate the [facility] provider you are looking for under [facility] and page to a number that you can be directly reached. If you still have difficulty reaching the provider, please page the [facility] (Director on Call) for the Hospitalists listed on amion for assistance.

## 2021-09-14 NOTE — Progress Notes (Signed)
Progress Note  Patient Name: Alexis Landry Date of Encounter: 09/14/2021  Primary Cardiologist: [name], MD  Subjective   Has had recurrent chest tightness overnight.  No cough or palpitations.  Inpatient Medications    Scheduled Meds:  amLODipine  10 mg Oral Daily   aspirin EC  81 mg Oral Daily   atorvastatin  10 mg Oral QPM   cloNIDine HCl  0.1 mg Oral QHS   enoxaparin (LOVENOX) injection  40 mg Subcutaneous Q24H   insulin aspart  0-5 Units Subcutaneous QHS   insulin aspart  0-9 Units Subcutaneous TID WC   insulin glargine-yfgn  25 Units Subcutaneous q morning    PRN Meds: acetaminophen **OR** acetaminophen, hydrALAZINE, nitroGLYCERIN, ondansetron **OR** ondansetron (ZOFRAN) IV, polyethylene glycol   Vital Signs    Vitals:   09/13/21 2134 09/13/21 2355 09/14/21 0102 09/14/21 0535  BP: (!) 153/66 (!) 147/68 (!) 148/73 137/66  Pulse: 69 71 63 61  Resp: 20 18 20 18   Temp: 99 F (37.2 C) 98.7 F (37.1 C) 98.2 F (36.8 C) 98.1 F (36.7 C)  TempSrc: Oral Oral Oral Oral  SpO2: 99% 98% 99% 96%  Weight:      Height:        Intake/Output Summary (Last 24 hours) at 09/14/2021 0951 Last data filed at 09/13/2021 2327 Gross per 24 hour  Intake 440 ml  Output 250 ml  Net 190 ml   Filed Weights   09/13/21 0848 09/13/21 1730  Weight: 104.3 kg 105.6 kg    Telemetry    Sinus rhythm.  Personally reviewed.  ECG    An ECG dated 09/13/2021 was personally reviewed today and demonstrated:  Sinus rhythm with borderline low voltage and prolonged PR interval.  Physical Exam   GEN: No acute distress.   Neck: No JVD. Cardiac: FFM,3/8 systolic murmur, no rub or gallop.  Respiratory: Nonlabored. Clear to auscultation bilaterally. GI: Soft, nontender, bowel sounds present. MS: No edema; No deformity. Neuro:  Nonfocal. Psych: Alert and oriented x 3. Normal affect.  Labs    Chemistry Recent Labs  Lab 09/13/21 0936 09/14/21 0450  NA 138 139  K 3.5 3.3*  CL  111 105  CO2 26 26  GLUCOSE 181* 166*  BUN 19 19  CREATININE 1.21* 1.32*  CALCIUM 8.6* 8.8*  PROT 6.7  --   ALBUMIN 3.2*  --   AST 18  --   ALT 18  --   ALKPHOS 48  --   BILITOT 1.2  --   GFRNONAA 54* 49*  ANIONGAP 1* 8     Hematology Recent Labs  Lab 09/13/21 0936  WBC 4.4  RBC 3.66*  HGB 10.7*  HCT 32.3*  MCV 88.3  MCH 29.2  MCHC 33.1  RDW 14.4  PLT 282    Cardiac Enzymes Recent Labs  Lab 09/13/21 0936 09/13/21 1116  TROPONINIHS 6 6    BNP Recent Labs  Lab 09/13/21 0937  BNP 304.0*     DDimer Recent Labs  Lab 09/13/21 1910  DDIMER 1.30*     Radiology    DG Chest 2 View  Result Date: 09/13/2021 CLINICAL DATA:  Chest pain and shortness of breath, history hypertension, diabetes mellitus type II EXAM: CHEST - 2 VIEW COMPARISON:  09/01/2020 FINDINGS: Normal heart size, mediastinal contours, and pulmonary vascularity. Streaky opacities at RIGHT lung base which could represent atelectasis or infiltrate. Central peribronchial thickening. Remaining lungs clear. No pleural effusion or pneumothorax. Osseous structures unremarkable. IMPRESSION: Bronchitic changes  with streaky opacities at RIGHT lung base question atelectasis versus infiltrate. Electronically Signed   By: Lavonia Dana M.D.   On: 09/13/2021 09:51   CT Angio Chest Pulmonary Embolism (PE) W or WO Contrast  Result Date: 09/13/2021 CLINICAL DATA:  Chest pain, elevated D-dimer, shortness of breath EXAM: CT ANGIOGRAPHY CHEST WITH CONTRAST TECHNIQUE: Multidetector CT imaging of the chest was performed using the standard protocol during bolus administration of intravenous contrast. Multiplanar CT image reconstructions and MIPs were obtained to evaluate the vascular anatomy. CONTRAST:  172mL OMNIPAQUE IOHEXOL 350 MG/ML SOLN COMPARISON:  09/01/2020 FINDINGS: Cardiovascular: No filling defects in the pulmonary arteries to suggest pulmonary emboli. Heart is borderline in size. Aorta normal caliber.  Mediastinum/Nodes: No mediastinal, hilar, or axillary adenopathy. Trachea and esophagus are unremarkable. Thyroid unremarkable. Lungs/Pleura: Small bilateral pleural effusions. Bibasilar dependent atelectasis. Upper Abdomen: Imaging into the upper abdomen demonstrates no acute findings. Musculoskeletal: Chest wall soft tissues are unremarkable. No acute bony abnormality. Review of the MIP images confirms the above findings. IMPRESSION: No evidence of pulmonary embolus. Small bilateral pleural effusions with dependent bibasilar atelectasis. Electronically Signed   By: Rolm Baptise M.D.   On: 09/13/2021 23:27    Cardiac Studies   Echocardiogram 11/10/2020:  1. Left ventricular ejection fraction, by estimation, is 65 to 70%. The  left ventricle has normal function. The left ventricle has no regional  wall motion abnormalities. There is mild left ventricular hypertrophy.  Left ventricular diastolic parameters  are indeterminate. Global longitudinal strain measurement low normal at  -16.4% and does not track myocardium completely, suspect higher actual  measurment.   2. Right ventricular systolic function is normal. The right ventricular  size is normal. There is normal pulmonary artery systolic pressure. The  estimated right ventricular systolic pressure is 10.9 mmHg.   3. Left atrial size was upper normal.   4. There is a trivial pericardial effusion that is circumferential.   5. The mitral valve is grossly normal. Mild mitral valve regurgitation.   6. The aortic valve is tricuspid. Aortic valve regurgitation is not  visualized.   7. The inferior vena cava is normal in size with greater than 50%  respiratory variability, suggesting right atrial pressure of 3 mmHg.   Assessment & Plan    1.  Recent recurrent chest tightness and shortness of breath concerning for unstable angina.  Cardiac markers remain nondiagnostic and BNP mildly elevated at 304.  Associated acute diastolic heart failure is also  of concern.  Follow-up echocardiogram pending but prior LVEF was 65 to 70% as of February.  Chest CTA negative for pulmonary embolus with small bilateral pleural effusions and atelectasis.  She has baseline type 2 diabetes mellitus, hypertension, and hyperlipidemia with no prior ischemic assessment.  2.  Essential hypertension, blood pressure trending improved today.  Currently on Norvasc, Hyzaar held temporarily by primary team with recent contrast and creatinine 1.32.  3.  Type 2 diabetes mellitus, has been on Farxiga as an outpatient, currently with insulin.  Not well controlled with hemoglobin A1c 9.4%.  4.  Mixed hyperlipidemia on Lipitor, plan to increase dose.  LDL 100.  Discussed risks and benefits of a diagnostic cardiac catheterization and she is in agreement to proceed.  We will arrange transfer for procedure today and then make medication adjustments as appropriate from there.  Follow-up echocardiogram is also pending to ensure no decline in LVEF.  Increase Lipitor to 40 mg daily, continue aspirin and Norvasc. NPO for now.  Signed, Rozann Lesches, MD  09/14/2021, 9:51 AM

## 2021-09-14 NOTE — H&P (View-Only) (Signed)
Progress Note  Patient Name: Alexis Landry Date of Encounter: 09/14/2021  Primary Cardiologist: [name], MD  Subjective   Has had recurrent chest tightness overnight.  No cough or palpitations.  Inpatient Medications    Scheduled Meds:  amLODipine  10 mg Oral Daily   aspirin EC  81 mg Oral Daily   atorvastatin  10 mg Oral QPM   cloNIDine HCl  0.1 mg Oral QHS   enoxaparin (LOVENOX) injection  40 mg Subcutaneous Q24H   insulin aspart  0-5 Units Subcutaneous QHS   insulin aspart  0-9 Units Subcutaneous TID WC   insulin glargine-yfgn  25 Units Subcutaneous q morning    PRN Meds: acetaminophen **OR** acetaminophen, hydrALAZINE, nitroGLYCERIN, ondansetron **OR** ondansetron (ZOFRAN) IV, polyethylene glycol   Vital Signs    Vitals:   09/13/21 2134 09/13/21 2355 09/14/21 0102 09/14/21 0535  BP: (!) 153/66 (!) 147/68 (!) 148/73 137/66  Pulse: 69 71 63 61  Resp: 20 18 20 18   Temp: 99 F (37.2 C) 98.7 F (37.1 C) 98.2 F (36.8 C) 98.1 F (36.7 C)  TempSrc: Oral Oral Oral Oral  SpO2: 99% 98% 99% 96%  Weight:      Height:        Intake/Output Summary (Last 24 hours) at 09/14/2021 0951 Last data filed at 09/13/2021 2327 Gross per 24 hour  Intake 440 ml  Output 250 ml  Net 190 ml   Filed Weights   09/13/21 0848 09/13/21 1730  Weight: 104.3 kg 105.6 kg    Telemetry    Sinus rhythm.  Personally reviewed.  ECG    An ECG dated 09/13/2021 was personally reviewed today and demonstrated:  Sinus rhythm with borderline low voltage and prolonged PR interval.  Physical Exam   GEN: No acute distress.   Neck: No JVD. Cardiac: YWV,3/7 systolic murmur, no rub or gallop.  Respiratory: Nonlabored. Clear to auscultation bilaterally. GI: Soft, nontender, bowel sounds present. MS: No edema; No deformity. Neuro:  Nonfocal. Psych: Alert and oriented x 3. Normal affect.  Labs    Chemistry Recent Labs  Lab 09/13/21 0936 09/14/21 0450  NA 138 139  K 3.5 3.3*  CL  111 105  CO2 26 26  GLUCOSE 181* 166*  BUN 19 19  CREATININE 1.21* 1.32*  CALCIUM 8.6* 8.8*  PROT 6.7  --   ALBUMIN 3.2*  --   AST 18  --   ALT 18  --   ALKPHOS 48  --   BILITOT 1.2  --   GFRNONAA 54* 49*  ANIONGAP 1* 8     Hematology Recent Labs  Lab 09/13/21 0936  WBC 4.4  RBC 3.66*  HGB 10.7*  HCT 32.3*  MCV 88.3  MCH 29.2  MCHC 33.1  RDW 14.4  PLT 282    Cardiac Enzymes Recent Labs  Lab 09/13/21 0936 09/13/21 1116  TROPONINIHS 6 6    BNP Recent Labs  Lab 09/13/21 0937  BNP 304.0*     DDimer Recent Labs  Lab 09/13/21 1910  DDIMER 1.30*     Radiology    DG Chest 2 View  Result Date: 09/13/2021 CLINICAL DATA:  Chest pain and shortness of breath, history hypertension, diabetes mellitus type II EXAM: CHEST - 2 VIEW COMPARISON:  09/01/2020 FINDINGS: Normal heart size, mediastinal contours, and pulmonary vascularity. Streaky opacities at RIGHT lung base which could represent atelectasis or infiltrate. Central peribronchial thickening. Remaining lungs clear. No pleural effusion or pneumothorax. Osseous structures unremarkable. IMPRESSION: Bronchitic changes  with streaky opacities at RIGHT lung base question atelectasis versus infiltrate. Electronically Signed   By: Lavonia Dana M.D.   On: 09/13/2021 09:51   CT Angio Chest Pulmonary Embolism (PE) W or WO Contrast  Result Date: 09/13/2021 CLINICAL DATA:  Chest pain, elevated D-dimer, shortness of breath EXAM: CT ANGIOGRAPHY CHEST WITH CONTRAST TECHNIQUE: Multidetector CT imaging of the chest was performed using the standard protocol during bolus administration of intravenous contrast. Multiplanar CT image reconstructions and MIPs were obtained to evaluate the vascular anatomy. CONTRAST:  167mL OMNIPAQUE IOHEXOL 350 MG/ML SOLN COMPARISON:  09/01/2020 FINDINGS: Cardiovascular: No filling defects in the pulmonary arteries to suggest pulmonary emboli. Heart is borderline in size. Aorta normal caliber.  Mediastinum/Nodes: No mediastinal, hilar, or axillary adenopathy. Trachea and esophagus are unremarkable. Thyroid unremarkable. Lungs/Pleura: Small bilateral pleural effusions. Bibasilar dependent atelectasis. Upper Abdomen: Imaging into the upper abdomen demonstrates no acute findings. Musculoskeletal: Chest wall soft tissues are unremarkable. No acute bony abnormality. Review of the MIP images confirms the above findings. IMPRESSION: No evidence of pulmonary embolus. Small bilateral pleural effusions with dependent bibasilar atelectasis. Electronically Signed   By: Rolm Baptise M.D.   On: 09/13/2021 23:27    Cardiac Studies   Echocardiogram 11/10/2020:  1. Left ventricular ejection fraction, by estimation, is 65 to 70%. The  left ventricle has normal function. The left ventricle has no regional  wall motion abnormalities. There is mild left ventricular hypertrophy.  Left ventricular diastolic parameters  are indeterminate. Global longitudinal strain measurement low normal at  -16.4% and does not track myocardium completely, suspect higher actual  measurment.   2. Right ventricular systolic function is normal. The right ventricular  size is normal. There is normal pulmonary artery systolic pressure. The  estimated right ventricular systolic pressure is 95.0 mmHg.   3. Left atrial size was upper normal.   4. There is a trivial pericardial effusion that is circumferential.   5. The mitral valve is grossly normal. Mild mitral valve regurgitation.   6. The aortic valve is tricuspid. Aortic valve regurgitation is not  visualized.   7. The inferior vena cava is normal in size with greater than 50%  respiratory variability, suggesting right atrial pressure of 3 mmHg.   Assessment & Plan    1.  Recent recurrent chest tightness and shortness of breath concerning for unstable angina.  Cardiac markers remain nondiagnostic and BNP mildly elevated at 304.  Associated acute diastolic heart failure is also  of concern.  Follow-up echocardiogram pending but prior LVEF was 65 to 70% as of February.  Chest CTA negative for pulmonary embolus with small bilateral pleural effusions and atelectasis.  She has baseline type 2 diabetes mellitus, hypertension, and hyperlipidemia with no prior ischemic assessment.  2.  Essential hypertension, blood pressure trending improved today.  Currently on Norvasc, Hyzaar held temporarily by primary team with recent contrast and creatinine 1.32.  3.  Type 2 diabetes mellitus, has been on Farxiga as an outpatient, currently with insulin.  Not well controlled with hemoglobin A1c 9.4%.  4.  Mixed hyperlipidemia on Lipitor, plan to increase dose.  LDL 100.  Discussed risks and benefits of a diagnostic cardiac catheterization and she is in agreement to proceed.  We will arrange transfer for procedure today and then make medication adjustments as appropriate from there.  Follow-up echocardiogram is also pending to ensure no decline in LVEF.  Increase Lipitor to 40 mg daily, continue aspirin and Norvasc. NPO for now.  Signed, Rozann Lesches, MD  09/14/2021, 9:51 AM

## 2021-09-14 NOTE — Interval H&P Note (Signed)
Cath Lab Visit (complete for each Cath Lab visit)  Clinical Evaluation Leading to the Procedure:   ACS: No.  Non-ACS:    Anginal Classification: CCS II  Anti-ischemic medical therapy: Minimal Therapy (1 class of medications)  Non-Invasive Test Results: No non-invasive testing performed  Prior CABG: No previous CABG      History and Physical Interval Note:  09/14/2021 3:44 PM  Alexis Landry  has presented today for surgery, with the diagnosis of chest pain.  The various methods of treatment have been discussed with the patient and family. After consideration of risks, benefits and other options for treatment, the patient has consented to  Procedure(s): LEFT HEART CATH AND CORONARY ANGIOGRAPHY (N/A) as a surgical intervention.  The patient's history has been reviewed, patient examined, no change in status, stable for surgery.  I have reviewed the patient's chart and labs.  Questions were answered to the patient's satisfaction.     Shelva Majestic

## 2021-09-15 ENCOUNTER — Encounter (HOSPITAL_COMMUNITY): Payer: Self-pay | Admitting: Cardiovascular Disease

## 2021-09-15 DIAGNOSIS — I200 Unstable angina: Secondary | ICD-10-CM | POA: Diagnosis not present

## 2021-09-15 DIAGNOSIS — I10 Essential (primary) hypertension: Secondary | ICD-10-CM | POA: Diagnosis not present

## 2021-09-15 DIAGNOSIS — E13319 Other specified diabetes mellitus with unspecified diabetic retinopathy without macular edema: Secondary | ICD-10-CM | POA: Diagnosis not present

## 2021-09-15 DIAGNOSIS — E782 Mixed hyperlipidemia: Secondary | ICD-10-CM

## 2021-09-15 DIAGNOSIS — Z794 Long term (current) use of insulin: Secondary | ICD-10-CM

## 2021-09-15 LAB — CBC
HCT: 34.3 % — ABNORMAL LOW (ref 36.0–46.0)
HCT: 34.3 % — ABNORMAL LOW (ref 36.0–46.0)
Hemoglobin: 11.2 g/dL — ABNORMAL LOW (ref 12.0–15.0)
Hemoglobin: 11.1 g/dL — ABNORMAL LOW (ref 12.0–15.0)
MCH: 27.9 pg (ref 26.0–34.0)
MCH: 27.7 pg (ref 26.0–34.0)
MCHC: 32.7 g/dL (ref 30.0–36.0)
MCHC: 32.4 g/dL (ref 30.0–36.0)
MCV: 85.5 fL (ref 80.0–100.0)
MCV: 85.5 fL (ref 80.0–100.0)
Platelets: 290 10*3/uL (ref 150–400)
Platelets: 308 10*3/uL (ref 150–400)
RBC: 4.01 MIL/uL (ref 3.87–5.11)
RBC: 4.01 MIL/uL (ref 3.87–5.11)
RDW: 14 % (ref 11.5–15.5)
RDW: 14 % (ref 11.5–15.5)
WBC: 4.3 10*3/uL (ref 4.0–10.5)
WBC: 3.9 10*3/uL — ABNORMAL LOW (ref 4.0–10.5)
nRBC: 0 % (ref 0.0–0.2)
nRBC: 0 % (ref 0.0–0.2)

## 2021-09-15 LAB — BASIC METABOLIC PANEL
Anion gap: 9 (ref 5–15)
Anion gap: 7 (ref 5–15)
BUN: 13 mg/dL (ref 6–20)
BUN: 12 mg/dL (ref 6–20)
CO2: 26 mmol/L (ref 22–32)
CO2: 26 mmol/L (ref 22–32)
Calcium: 8.8 mg/dL — ABNORMAL LOW (ref 8.9–10.3)
Calcium: 8.5 mg/dL — ABNORMAL LOW (ref 8.9–10.3)
Chloride: 104 mmol/L (ref 98–111)
Chloride: 105 mmol/L (ref 98–111)
Creatinine, Ser: 1.25 mg/dL — ABNORMAL HIGH (ref 0.44–1.00)
Creatinine, Ser: 1.27 mg/dL — ABNORMAL HIGH (ref 0.44–1.00)
GFR, Estimated: 52 mL/min — ABNORMAL LOW (ref >60)
GFR, Estimated: 51 mL/min — ABNORMAL LOW (ref >60)
Glucose, Bld: 71 mg/dL (ref 70–99)
Glucose, Bld: 89 mg/dL (ref 70–99)
Potassium: 3.2 mmol/L — ABNORMAL LOW (ref 3.5–5.1)
Potassium: 3.3 mmol/L — ABNORMAL LOW (ref 3.5–5.1)
Sodium: 139 mmol/L (ref 135–145)
Sodium: 138 mmol/L (ref 135–145)

## 2021-09-15 LAB — GLUCOSE, CAPILLARY
Glucose-Capillary: 79 mg/dL (ref 70–99)
Glucose-Capillary: 180 mg/dL — ABNORMAL HIGH (ref 70–99)
Glucose-Capillary: 209 mg/dL — ABNORMAL HIGH (ref 70–99)

## 2021-09-15 MED ORDER — ATORVASTATIN CALCIUM 40 MG PO TABS: 40.0000 mg | ORAL_TABLET | Freq: Every evening | ORAL | Status: DC

## 2021-09-15 MED ORDER — POTASSIUM CHLORIDE CRYS ER 20 MEQ PO TBCR
20.0000 meq | EXTENDED_RELEASE_TABLET | Freq: Once | ORAL | Status: AC
  Administered 2021-09-15: 10:00:00 20 meq via ORAL
  Filled 2021-09-15: qty 1

## 2021-09-15 MED ORDER — PANTOPRAZOLE SODIUM 40 MG PO TBEC: 40.0000 mg | DELAYED_RELEASE_TABLET | Freq: Every day | ORAL | 1 refills | Status: DC

## 2021-09-15 MED ORDER — ASPIRIN 81 MG PO TBEC: 81.0000 mg | DELAYED_RELEASE_TABLET | Freq: Every day | ORAL | 2 refills | Status: DC

## 2021-09-15 MED ORDER — POTASSIUM CHLORIDE CRYS ER 20 MEQ PO TBCR
40.0000 meq | EXTENDED_RELEASE_TABLET | Freq: Once | ORAL | Status: AC
  Administered 2021-09-15: 10:00:00 20 meq via ORAL
  Filled 2021-09-15: qty 2

## 2021-09-15 MED ORDER — PANTOPRAZOLE SODIUM 40 MG PO TBEC
40.0000 mg | DELAYED_RELEASE_TABLET | Freq: Every day | ORAL | Status: DC
  Administered 2021-09-15: 10:00:00 40 mg via ORAL
  Filled 2021-09-15: qty 1

## 2021-09-15 MED ORDER — ATORVASTATIN CALCIUM 40 MG PO TABS: 40.0000 mg | ORAL_TABLET | Freq: Every evening | ORAL | 1 refills | Status: DC

## 2021-09-15 NOTE — Plan of Care (Signed)

## 2021-09-15 NOTE — Discharge Summary (Signed)
Discharge Summary    Patient ID: Alexis Landry; DOB: Feb 20, 1971  Admit date: 09/13/2021 Discharge date: 09/15/2021  PCP:  [name], [place] [facility] Cardiologist:  [name], MD     Discharge Diagnoses    Principal Problem:   Chest pain Active Problems:   HTN (hypertension)   DM (diabetes mellitus) ([place])   Rheumatoid arthritis ([place])   Unstable angina [facility])  Diagnostic Studies/Procedures    Cath: 09/14/21  Dist RCA lesion is 30% stenosed.   The left ventricular systolic function is normal.   LV end diastolic pressure is normal.   The left ventricular ejection fraction is 55-65% by visual estimate.   No significant coronary obstructive disease with mild smooth  30% narrowing in the distal RCA proximal to the PDA takeoff.  The LAD and circumflex and the remainder of the the RCA appeared angiographically normal.   Normal LV function with EF estimated at 55 to 65% without focal segmental wall motion abnormalities.  LVEDP 12 mmHg.   RECOMMENDATION: Low-dose aspirin therapy.  Medical therapy for mild nonobstructive CAD in this patient with hypertension and hyperlipidemia.  Aim for target LDL less than 70.  Echo: 09/14/21  IMPRESSIONS     1. Left ventricular ejection fraction, by estimation, is 65 to 70%. The  left ventricle has normal function. The left ventricle has no regional  wall motion abnormalities. There is moderate concentric left ventricular  hypertrophy. Left ventricular  diastolic parameters are indeterminate. Elevated left ventricular  end-diastolic pressure.   2. Right ventricular systolic function is normal. The right ventricular  size is normal. Tricuspid regurgitation signal is inadequate for assessing  PA pressure.   3. Left atrial size was upper normal.   4. A small pericardial effusion is present. The pericardial effusion is  posterior to the left ventricle and localized near the right atrium.   5.  The mitral valve is grossly normal. Mild mitral valve regurgitation.   6. The aortic valve is tricuspid. Aortic valve regurgitation is not  visualized.   7. The inferior vena cava is normal in size with greater than 50%  respiratory variability, suggesting right atrial pressure of 3 mmHg.   Comparison(s): Prior images reviewed side by side. No significant change  in LVEF. Elevated LVEDP. Small pericardial effusion.   FINDINGS   Left Ventricle: Left ventricular ejection fraction, by estimation, is 65  to 70%. The left ventricle has normal function. The left ventricle has no  regional wall motion abnormalities. The left ventricular internal cavity  size was normal in size. There is   moderate concentric left ventricular hypertrophy. Left ventricular  diastolic parameters are indeterminate. Elevated left ventricular  end-diastolic pressure.   Right Ventricle: The right ventricular size is normal. No increase in  right ventricular wall thickness. Right ventricular systolic function is  normal. Tricuspid regurgitation signal is inadequate for assessing PA  pressure.   Left Atrium: Left atrial size was upper normal.   Right Atrium: Right atrial size was normal in size.   Pericardium: A small pericardial effusion is present. The pericardial  effusion is posterior to the left ventricle and localized near the right  atrium.   Mitral Valve: The mitral valve is grossly normal. Mild mitral valve  regurgitation.   Tricuspid Valve: The tricuspid valve is grossly normal. Tricuspid valve  regurgitation is trivial.   Aortic Valve: The aortic valve is tricuspid. Aortic valve regurgitation is  not visualized.   Pulmonic Valve: The pulmonic  valve was grossly normal. Pulmonic valve  regurgitation is trivial.   Aorta: The aortic root is normal in size and structure.   Venous: The inferior vena cava is normal in size with greater than 50%  respiratory variability, suggesting right atrial  pressure of 3 mmHg.   IAS/Shunts: No atrial level shunt detected by color flow Doppler.  _____________   History of Present Illness     Alexis Landry is a 50 y.o. female with hypertension, diabetes, hyperlipidemia and RA who presented to [facility], ED with chest tightness and shortness of breath.  Ms. Kotz presented to the [facility] ER reporting a 3-day history of intermittent chest tightness.  Initial episode woke her from sleep, she had to sit up in bed.  She had an episode at rest the day prior to admission, and again the day of felt very short of breath just with taking a shower and walking about in her house.  She felt like she was wheezing, never any cough, but definite sense of orthopnea and some minor ankle edema.  Her weight had increased about 10 pounds over the last several months.   She saw Dr. Harrington Challenger for follow-up in November. She had an echocardiogram back in February that demonstrated LVEF 65 to 70% with mild LVH and indeterminate diastolic function, trivial pericardial effusion, and mild mitral regurgitation.  She has been treated for hypertension, does admit that she occasionally misses doses of her medications.  Also has been on Lasix intermittently. No prior ischemic work-up noted on chart review.    Mated to internal medicine and evaluated by Dr. Domenic Polite while at [facility].  Recommendations to transfer to [facility] to undergo further evaluation with cardiac catheterization.  Hospital Course     Chest tightness/shortness of breath: hsTn cycled and negative x3.  EKG without ischemia.  Underwent cardiac catheterization on 12/13 noted above with 30% distal RCA lesion.  Recommendations for ongoing medical therapy and risk factor modification.  It was felt GI source most likely etiology for patient's symptoms. --Will continue aspirin, statin, amlodipine, ARB/HCTZ combo  Hypertension: Continue amlodipine 10 mg daily, resume losartan/hydrochlorothiazide at discharge  Diabetes:  Hemoglobin A1c 9.4 -- Continue metformin as well as [place]   Hyperlipidemia: LDL 100, HDL 55 -- Lipitor 10 mg daily was increased to 40 mg daily -- FLP/LFTs in 8 weeks  GERD: switched from prilosec to protonix 40mg  daily at discharge   Hypokalemia: K+ supplemented prior to discharge   RA: continue Soliqua   Patient was seen by Dr. Marisue Ivan and deemed stable for discharge home. Follow up in the office has been arranged. Medications sent to patient Pharmacy.   Did the patient have an acute coronary syndrome (MI, NSTEMI, STEMI, etc) this admission?:  No                               Did the patient have a percutaneous coronary intervention (stent / angioplasty)?:  No.   _____________  Discharge Vitals Blood pressure (!) 145/67, pulse 71, temperature 98.1 F (36.7 C), temperature source Oral, resp. rate 16, height 5\' 6"  (1.676 m), weight 104 kg, last menstrual period 09/19/2016, SpO2 100 %.  Filed Weights   09/13/21 1730 09/14/21 1706 09/15/21 0034  Weight: 105.6 kg 103.3 kg 104 kg    Labs & Radiologic Studies    CBC Recent Labs    09/15/21 0416 09/15/21 0721  WBC 4.3 3.9*  HGB 11.2* 11.1*  HCT 34.3* 34.3*  MCV 85.5 85.5  PLT 290 284   Basic Metabolic Panel Recent Labs    09/15/21 0416 09/15/21 0721  NA 139 138  K 3.2* 3.3*  CL 104 105  CO2 26 26  GLUCOSE 71 89  BUN 13 12  CREATININE 1.25* 1.27*  CALCIUM 8.8* 8.5*   Liver Function Tests Recent Labs    09/13/21 0936  AST 18  ALT 18  ALKPHOS 48  BILITOT 1.2  PROT 6.7  ALBUMIN 3.2*   No results for input(s): LIPASE, AMYLASE in the last 72 hours. High Sensitivity Troponin:   Recent Labs  Lab 09/13/21 0936 09/13/21 1116  TROPONINIHS 6 6    BNP Invalid input(s): POCBNP D-Dimer Recent Labs    09/13/21 1910  DDIMER 1.30*   Hemoglobin A1C Recent Labs    09/13/21 1910  HGBA1C 9.4*   Fasting Lipid Panel Recent Labs    09/14/21 0450  CHOL 175  HDL 55  LDLCALC 100*  TRIG 102  CHOLHDL 3.2    Thyroid Function Tests No results for input(s): TSH, T4TOTAL, T3FREE, THYROIDAB in the last 72 hours.  Invalid input(s): FREET3 _____________  DG Chest 2 View  Result Date: 09/13/2021 CLINICAL DATA:  Chest pain and shortness of breath, history hypertension, diabetes mellitus type II EXAM: CHEST - 2 VIEW COMPARISON:  09/01/2020 FINDINGS: Normal heart size, mediastinal contours, and pulmonary vascularity. Streaky opacities at RIGHT lung base which could represent atelectasis or infiltrate. Central peribronchial thickening. Remaining lungs clear. No pleural effusion or pneumothorax. Osseous structures unremarkable. IMPRESSION: Bronchitic changes with streaky opacities at RIGHT lung base question atelectasis versus infiltrate. Electronically Signed   By: Lavonia Dana M.D.   On: 09/13/2021 09:51   CT Angio Chest Pulmonary Embolism (PE) W or WO Contrast  Result Date: 09/13/2021 CLINICAL DATA:  Chest pain, elevated D-dimer, shortness of breath EXAM: CT ANGIOGRAPHY CHEST WITH CONTRAST TECHNIQUE: Multidetector CT imaging of the chest was performed using the standard protocol during bolus administration of intravenous contrast. Multiplanar CT image reconstructions and MIPs were obtained to evaluate the vascular anatomy. CONTRAST:  123mL OMNIPAQUE IOHEXOL 350 MG/ML SOLN COMPARISON:  09/01/2020 FINDINGS: Cardiovascular: No filling defects in the pulmonary arteries to suggest pulmonary emboli. Heart is borderline in size. Aorta normal caliber. Mediastinum/Nodes: No mediastinal, hilar, or axillary adenopathy. Trachea and esophagus are unremarkable. Thyroid unremarkable. Lungs/Pleura: Small bilateral pleural effusions. Bibasilar dependent atelectasis. Upper Abdomen: Imaging into the upper abdomen demonstrates no acute findings. Musculoskeletal: Chest wall soft tissues are unremarkable. No acute bony abnormality. Review of the MIP images confirms the above findings. IMPRESSION: No evidence of pulmonary embolus.  Small bilateral pleural effusions with dependent bibasilar atelectasis. Electronically Signed   By: Rolm Baptise M.D.   On: 09/13/2021 23:27   CARDIAC CATHETERIZATION  Result Date: 09/14/2021   Dist RCA lesion is 30% stenosed.   The left ventricular systolic function is normal.   LV end diastolic pressure is normal.   The left ventricular ejection fraction is 55-65% by visual estimate. No significant coronary obstructive disease with mild smooth  30% narrowing in the distal RCA proximal to the PDA takeoff.  The LAD and circumflex and the remainder of the the RCA appeared angiographically normal. Normal LV function with EF estimated at 55 to 65% without focal segmental wall motion abnormalities.  LVEDP 12 mmHg. RECOMMENDATION: Low-dose aspirin therapy.  Medical therapy for mild nonobstructive CAD in this patient with hypertension and hyperlipidemia.  Aim for target LDL less than 70.  ECHOCARDIOGRAM COMPLETE  Result Date: 09/14/2021    ECHOCARDIOGRAM REPORT   Patient Name:   [name] Date of Exam: 09/14/2021 Medical Rec #:  [id number]        Height:       66.0 in Accession #:    [id number]       Weight:       232.7 lb Date of Birth:  1970/10/06       BSA:          2.133 m Patient Age:    21 years         BP:           137/66 mmHg Patient Gender: F                HR:           61 bpm. Exam Location:  [facility] Procedure: 2D Echo, Cardiac Doppler and Color Doppler Indications:    Chest Pain R07.9  History:        Patient has prior history of Echocardiogram examinations, most                 recent 11/10/2020. Signs/Symptoms:Shortness of Breath and Murmur;                 Risk Factors:Hypertension, Diabetes, Dyslipidemia and                 Non-Smoker. Covid 11/12/19, Arrythmias:1st degree AV Block.  Sonographer:    Alvino Chapel RCS Referring Phys: [phone] [place]  1. Left ventricular ejection fraction, by estimation, is 65 to 70%. The left ventricle has normal function. The left  ventricle has no regional wall motion abnormalities. There is moderate concentric left ventricular hypertrophy. Left ventricular diastolic parameters are indeterminate. Elevated left ventricular end-diastolic pressure.  2. Right ventricular systolic function is normal. The right ventricular size is normal. Tricuspid regurgitation signal is inadequate for assessing PA pressure.  3. Left atrial size was upper normal.  4. A small pericardial effusion is present. The pericardial effusion is posterior to the left ventricle and localized near the right atrium.  5. The mitral valve is grossly normal. Mild mitral valve regurgitation.  6. The aortic valve is tricuspid. Aortic valve regurgitation is not visualized.  7. The inferior vena cava is normal in size with greater than 50% respiratory variability, suggesting right atrial pressure of 3 mmHg. Comparison(s): Prior images reviewed side by side. No significant change in LVEF. Elevated LVEDP. Small pericardial effusion. FINDINGS  Left Ventricle: Left ventricular ejection fraction, by estimation, is 65 to 70%. The left ventricle has normal function. The left ventricle has no regional wall motion abnormalities. The left ventricular internal cavity size was normal in size. There is  moderate concentric left ventricular hypertrophy. Left ventricular diastolic parameters are indeterminate. Elevated left ventricular end-diastolic pressure. Right Ventricle: The right ventricular size is normal. No increase in right ventricular wall thickness. Right ventricular systolic function is normal. Tricuspid regurgitation signal is inadequate for assessing PA pressure. Left Atrium: Left atrial size was upper normal. Right Atrium: Right atrial size was normal in size. Pericardium: A small pericardial effusion is present. The pericardial effusion is posterior to the left ventricle and localized near the right atrium. Mitral Valve: The mitral valve is grossly normal. Mild mitral valve  regurgitation. Tricuspid Valve: The tricuspid valve is grossly normal. Tricuspid valve regurgitation is trivial. Aortic Valve: The aortic valve is tricuspid. Aortic valve regurgitation is not visualized. Pulmonic Valve:  The pulmonic valve was grossly normal. Pulmonic valve regurgitation is trivial. Aorta: The aortic root is normal in size and structure. Venous: The inferior vena cava is normal in size with greater than 50% respiratory variability, suggesting right atrial pressure of 3 mmHg. IAS/Shunts: No atrial level shunt detected by color flow Doppler.  LEFT VENTRICLE PLAX 2D LVIDd:         4.60 cm   Diastology LVIDs:         2.40 cm   LV e' medial:    6.85 cm/s LV PW:         1.20 cm   LV E/e' medial:  15.2 LV IVS:        1.30 cm   LV e' lateral:   9.25 cm/s LVOT diam:     1.80 cm   LV E/e' lateral: 11.2 LV SV:         66 LV SV Index:   31 LVOT Area:     2.54 cm  RIGHT VENTRICLE RV S prime:     13.60 cm/s TAPSE (M-mode): 2.7 cm LEFT ATRIUM             Index        RIGHT ATRIUM           Index LA diam:        3.50 cm 1.64 cm/m   RA Area:     13.60 cm LA Vol (A2C):   77.2 ml 36.20 ml/m  RA Volume:   29.60 ml  13.88 ml/m LA Vol (A4C):   59.5 ml 27.90 ml/m LA Biplane Vol: 69.7 ml 32.68 ml/m  AORTIC VALVE LVOT Vmax:   114.00 cm/s LVOT Vmean:  78.400 cm/s LVOT VTI:    0.261 m  AORTA Ao Root diam: 3.30 cm MITRAL VALVE MV Area (PHT): 3.42 cm     SHUNTS MV Decel Time: 222 msec     Systemic VTI:  0.26 m MV E velocity: 104.00 cm/s  Systemic Diam: 1.80 cm MV A velocity: 99.80 cm/s MV E/A ratio:  1.04 Rozann Lesches MD Electronically signed by Rozann Lesches MD Signature Date/Time: 09/14/2021/12:30:12 PM    Final    Disposition   Pt is being discharged home today in good condition.  Follow-up Plans & Appointments     Follow-up Information     Fay Records, MD Follow up on 10/06/2021.   Specialty: Cardiology Why: at 2pm for your follow up appt. Contact information: [address]  [place] [address] [phone]         [name], PA-C Follow up.   Specialty: Family Medicine Why: Please keep routine follow up regarding management of your diabetes. Contact information: [address] [place] [address] [phone]                Discharge Instructions     Call MD for:  difficulty breathing, headache or visual disturbances   Complete by: As directed    Call MD for:  redness, tenderness, or signs of infection (pain, swelling, redness, odor or green/yellow discharge around incision site)   Complete by: As directed    Diet - low sodium heart healthy   Complete by: As directed    Discharge instructions   Complete by: As directed    Radial Site Care Refer to this sheet in the next few weeks. These instructions provide you with information on caring for yourself after your procedure. Your caregiver may also give you more  specific instructions. Your treatment has been planned according to current medical practices, but problems sometimes occur. Call your caregiver if you have any problems or questions after your procedure. HOME CARE INSTRUCTIONS You may shower the day after the procedure. Remove the bandage (dressing) and gently wash the site with plain soap and water. Gently pat the site dry.  Do not apply powder or lotion to the site.  Do not submerge the affected site in water for 3 to 5 days.  Inspect the site at least twice daily.  Do not flex or bend the affected arm for 24 hours.  No lifting over 5 pounds (2.3 kg) for 5 days after your procedure.  Do not drive home if you are discharged the same day of the procedure. Have someone else drive you.  You may drive 24 hours after the procedure unless otherwise instructed by your caregiver.  What to expect: Any bruising will usually fade within 1 to 2 weeks.  Blood that collects in the tissue (hematoma) may be painful to the touch. It should usually decrease in size and  tenderness within 1 to 2 weeks.  SEEK IMMEDIATE MEDICAL CARE IF: You have unusual pain at the radial site.  You have redness, warmth, swelling, or pain at the radial site.  You have drainage (other than a small amount of blood on the dressing).  You have chills.  You have a fever or persistent symptoms for more than 72 hours.  You have a fever and your symptoms suddenly get worse.  Your arm becomes pale, cool, tingly, or numb.  You have heavy bleeding from the site. Hold pressure on the site.   Increase activity slowly   Complete by: As directed        Discharge Medications   Allergies as of 09/15/2021       Reactions   Lisinopril Anaphylaxis   Throat swelling    Sulfa Antibiotics Itching        Medication List     STOP taking these medications    albuterol 108 (90 Base) MCG/ACT inhaler Commonly known as: VENTOLIN HFA   Besivance 0.6 % Susp Generic drug: Besifloxacin HCl   cyclobenzaprine 10 MG tablet Commonly known as: FLEXERIL   Durezol 0.05 % Emul Generic drug: Difluprednate   Jardiance 25 MG Tabs tablet Generic drug: empagliflozin   naproxen 375 MG tablet Commonly known as: NAPROSYN   omeprazole 20 MG capsule Commonly known as: PRILOSEC   Prolensa 0.07 % Soln Generic drug: Bromfenac Sodium   triamcinolone cream 0.1 % Commonly known as: KENALOG       TAKE these medications    amLODipine 10 MG tablet Commonly known as: NORVASC Take 1 tablet (10 mg total) by mouth daily.   aspirin 81 MG EC tablet Take 1 tablet (81 mg total) by mouth daily. Swallow whole.   atorvastatin 40 MG tablet Commonly known as: LIPITOR Take 1 tablet (40 mg total) by mouth every evening. What changed:  medication strength how much to take   cloNIDine HCl 0.1 MG Tb12 ER tablet Commonly known as: KAPVAY Take 0.1 mg by mouth at bedtime.   Farxiga 10 MG Tabs tablet Generic drug: dapagliflozin propanediol Take 10 mg by mouth every morning.   furosemide 20 MG  tablet Commonly known as: LASIX Take 20 mg by mouth daily.   losartan-hydrochlorothiazide 100-25 MG tablet Commonly known as: HYZAAR Take 1 tablet by mouth daily.   meloxicam 15 MG tablet Commonly known as: MOBIC Take 15  mg by mouth daily as needed for pain.   metFORMIN 500 MG 24 hr tablet Commonly known as: GLUCOPHAGE-XR Take 1,000 mg by mouth in the morning and at bedtime.   ofloxacin 0.3 % ophthalmic solution Commonly known as: OCUFLOX Place 1 drop into both eyes every 4 (four) hours.   pantoprazole 40 MG tablet Commonly known as: PROTONIX Take 1 tablet (40 mg total) by mouth daily.   Soliqua 100-33 UNT-MCG/ML Sopn Generic drug: Insulin Glargine-Lixisenatide Inject 40 Units into the skin in the morning.        Outstanding Labs/Studies   FLP/LFTs in 8 weeks   Duration of Discharge Encounter   Greater than 30 minutes including physician time.  Signed, Reino Bellis, NP 09/15/2021, 9:08 AM

## 2021-09-23 DIAGNOSIS — I10 Essential (primary) hypertension: Secondary | ICD-10-CM | POA: Diagnosis not present

## 2021-09-23 DIAGNOSIS — E1165 Type 2 diabetes mellitus with hyperglycemia: Secondary | ICD-10-CM | POA: Diagnosis not present

## 2021-09-23 DIAGNOSIS — E782 Mixed hyperlipidemia: Secondary | ICD-10-CM | POA: Diagnosis not present

## 2021-09-23 DIAGNOSIS — Z6834 Body mass index (BMI) 34.0-34.9, adult: Secondary | ICD-10-CM | POA: Diagnosis not present

## 2021-09-23 DIAGNOSIS — E6609 Other obesity due to excess calories: Secondary | ICD-10-CM | POA: Diagnosis not present

## 2021-09-29 ENCOUNTER — Ambulatory Visit (HOSPITAL_COMMUNITY)
Admission: RE | Admit: 2021-09-29 | Discharge: 2021-09-29 | Disposition: A | Discharge status: Home / Self Care | Payer: BC Managed Care – PPO | Source: Ambulatory Visit | Attending: Urology | Admitting: Urology

## 2021-09-29 ENCOUNTER — Other Ambulatory Visit: Payer: Self-pay

## 2021-09-29 DIAGNOSIS — R3129 Other microscopic hematuria: Secondary | ICD-10-CM | POA: Diagnosis not present

## 2021-09-29 DIAGNOSIS — N3289 Other specified disorders of bladder: Secondary | ICD-10-CM | POA: Diagnosis not present

## 2021-09-29 DIAGNOSIS — M5126 Other intervertebral disc displacement, lumbar region: Secondary | ICD-10-CM | POA: Diagnosis not present

## 2021-09-29 LAB — POCT I-STAT CREATININE: Creatinine, Ser: 1.5 mg/dL — ABNORMAL HIGH (ref 0.44–1.00)

## 2021-09-29 MED ORDER — IOHEXOL 300 MG/ML  SOLN
75.0000 mL | Freq: Once | INTRAMUSCULAR | Status: AC | PRN
  Administered 2021-09-29: 13:00:00 75 mL via INTRAVENOUS

## 2021-10-05 NOTE — Progress Notes (Deleted)
Cardiology Office Note   Date:  10/05/2021   ID:  [name], DOB [birth year], MRN [id number]  PCP:  [name], PA-C  Cardiologist:   [name], MD   Pt presents for follow up of chest pressure   History of Present Illness: Alexis Landry is a 51 y.o. female with a history of HTN, HL, DM who is followed at [facility]  She also has a hx of chest pressure / heaviness   In 2021 was seen in ED .BP elevated   Trop neg  D dimer mildly elevated but CT neg for PE   Small pericardial effusion noted   She was placed on naprosyn and lasix   I saw her in Jan 2022   I did not think it sounded like pericarditis or ischemia   Set pt up for echo   Also set up for USN of carotids  BP was elevated and I added amlodpine    Echo was done on  11/10/20  OVerall Normal   I recomm watching salt     Carotid USN (done for murmur) was also normal   I saw her in March  2022    She denes CP   Breathing is good  No palpitaitons   No dizzness  Diet:  1 meal per day usually  Does eat carbs     I saw the pt in clinic in Nov 2022  No outpatient medications have been marked as taking for the 10/06/21 encounter (Appointment) with Fay Records, MD.     Allergies:   Lisinopril and Sulfa antibiotics   Past Medical History:  Diagnosis Date   Diabetic neuropathy ([place])    DM (diabetes mellitus), type 2, uncontrolled    Epiretinal membrane, right    GERD (gastroesophageal reflux disease)    HTN (hypertension)    Hyperlipidemia    Macular edema, diabetic ([place])    Rheumatoid arthritis ([place])    Vitamin D deficiency     Past Surgical History:  Procedure Laterality Date   COLONOSCOPY N/A 01/04/2017   Procedure: COLONOSCOPY;  Surgeon: Danie Binder, MD;  [facility];  Service: Endoscopy;  Laterality: N/A;  10:30am   KENALOG INJECTION Right 12/03/2018   Procedure: LUCENTIS 0.3MG  INJECTION;  Surgeon: Sherlynn Stalls, MD;  Location: [place];  Service: Ophthalmology;  Laterality: Right;    LEFT HEART CATH AND CORONARY ANGIOGRAPHY N/A 09/14/2021   Procedure: LEFT HEART CATH AND CORONARY ANGIOGRAPHY;  Surgeon: Troy Sine, MD;  Location: [place] [facility];  Service: Cardiovascular;  Laterality: N/A;   MEMBRANE PEEL Right 12/03/2018   Procedure: MEMBRANE PEEL;  Surgeon: Sherlynn Stalls, MD;  Location: [place];  Service: Ophthalmology;  Laterality: Right;   PARS PLANA VITRECTOMY Right 12/03/2018   Procedure: PARS PLANA VITRECTOMY WITH 25 GAUGE;  Surgeon: Sherlynn Stalls, MD;  Location: [place];  Service: Ophthalmology;  Laterality: Right;   PHOTOCOAGULATION WITH LASER Right 12/03/2018   Procedure: PHOTOCOAGULATION WITH LASER;  Surgeon: Sherlynn Stalls, MD;  Location: [place];  Service: Ophthalmology;  Laterality: Right;   TUBAL LIGATION       Social History:  The patient  reports that she has never smoked. She has never used smokeless tobacco. She reports current alcohol use. She reports that she does not use drugs.   Family History:  The patient's family history includes Diabetes in her maternal grandfather, paternal grandfather, and paternal grandmother; Heart disease in her mother; Kidney disease in her father.  ROS:  Please see the history of present illness. All other systems are reviewed and  Negative to the above problem except as noted.    PHYSICAL EXAM: VS:  LMP 09/19/2016 (Approximate)   GEN: Morbidly obese 50 yo in no acute distress  HEENT: normal  Neck: no JVD, Cardiac: RRR; no murmurs  No LE  edema  Respiratory:  clear to auscultation bilaterally, GI: soft, nontender, nondistended, + BS   MS: no deformity Moving all extremities   Skin: warm and dry, no rash Neuro:  Strength and sensation are intact Psych: euthymic mood, full affect   EKG:  EKG is not ordered today.   Echo  11/10/20  1. Left ventricular ejection fraction, by estimation, is 65 to 70%. The left ventricle has normal function. The left ventricle has no regional wall motion abnormalities. There is mild  left ventricular hypertrophy. Left ventricular diastolic parameters are indeterminate. Global longitudinal strain measurement low normal at -16.4% and does not track myocardium completely, suspect higher actual measurment. 2. Right ventricular systolic function is normal. The right ventricular size is normal. There is normal pulmonary artery systolic pressure. The estimated right ventricular systolic pressure is 35.3 mmHg. 3. Left atrial size was upper normal. 4. There is a trivial pericardial effusion that is circumferential. 5. The mitral valve is grossly normal. Mild mitral valve regurgitation. 6. The aortic valve is tricuspid. Aortic valve regurgitation is not visualized. 7. The inferior vena cava is normal in size with greater than 50% respiratory variability, suggesting right atrial pressure of 3 mmHg.  11/10/20  Carotid USN  Normal     Lipid Panel    Component Value Date/Time   CHOL 175 09/14/2021 0450   TRIG 102 09/14/2021 0450   HDL 55 09/14/2021 0450   CHOLHDL 3.2 09/14/2021 0450   VLDL 20 09/14/2021 0450   LDLCALC 100 (H) 09/14/2021 0450      Wt Readings from Last 3 Encounters:  09/15/21 229 lb 4.5 oz (104 kg)  08/09/21 231 lb (104.8 kg)  07/16/21 226 lb 6.4 oz (102.7 kg)      ASSESSMENT AND PLAN:  1  Hx of Chest pressure  Denies  2  HTN  BP is not optimal  146/76 on my check   She says it is like this or higher at home     Would increase amloidipine to 10 mg    Follow up in  January   3  DOE   VOlume is OK on exam   Follow   Stay active        4  Neck murmur   Carotid USN was normal     5 HL  Keep on lipitor    6   Obesity  Again discussed low carb, keop and TRE   DIscussed diet, limit carbs   COnsider TRE      Plan for f/u inApril    Current medicines are reviewed at length with the patient today.  The patient does not have concerns regarding medicines.  Signed, [name], MD  10/05/2021 9:00 PM    [address], [place], [place]  [address] Phone: [phone]; Fax: [phone]

## 2021-10-06 ENCOUNTER — Ambulatory Visit: Payer: BC Managed Care – PPO | Admitting: Internal Medicine

## 2021-10-20 ENCOUNTER — Ambulatory Visit (INDEPENDENT_AMBULATORY_CARE_PROVIDER_SITE_OTHER): Payer: BC Managed Care – PPO | Admitting: Urology

## 2021-10-20 ENCOUNTER — Encounter: Payer: Self-pay | Admitting: Urology

## 2021-10-20 ENCOUNTER — Other Ambulatory Visit: Payer: Self-pay

## 2021-10-20 VITALS — BP 157/81 | HR 76 | Wt 226.0 lb

## 2021-10-20 DIAGNOSIS — R350 Frequency of micturition: Secondary | ICD-10-CM | POA: Diagnosis not present

## 2021-10-20 LAB — URINALYSIS, ROUTINE W REFLEX MICROSCOPIC
Bilirubin, UA: NEGATIVE
Leukocytes,UA: NEGATIVE
Nitrite, UA: NEGATIVE
Specific Gravity, UA: 1.025 (ref 1.005–1.030)
Urobilinogen, Ur: 0.2 mg/dL (ref 0.2–1.0)
pH, UA: 5 (ref 5.0–7.5)

## 2021-10-20 LAB — MICROSCOPIC EXAMINATION
Renal Epithel, UA: NONE SEEN /hpf
WBC, UA: NONE SEEN /hpf (ref 0–5)

## 2021-10-20 MED ORDER — CIPROFLOXACIN HCL 500 MG PO TABS
500.0000 mg | ORAL_TABLET | Freq: Once | ORAL | Status: AC
  Administered 2021-10-20: 500 mg via ORAL

## 2021-10-20 NOTE — Patient Instructions (Signed)

## 2021-10-20 NOTE — Progress Notes (Signed)
Urological Symptom Review  Patient is experiencing the following symptoms: Frequent urination Get up at night to urinate   Review of Systems  Gastrointestinal (upper)  : Indigestion/heartburn  Gastrointestinal (lower) : Negative for lower GI symptoms  Constitutional : Negative for symptoms  Skin: Negative for skin symptoms  Eyes: Negative for eye symptoms  Ear/Nose/Throat : Negative for Ear/Nose/Throat symptoms  Hematologic/Lymphatic: Negative for Hematologic/Lymphatic symptoms  Cardiovascular : Negative for cardiovascular symptoms  Respiratory : Negative for respiratory symptoms  Endocrine: Negative for endocrine symptoms  Musculoskeletal: Negative for musculoskeletal symptoms  Neurological: Negative for neurological symptoms  Psychologic: Negative for psychiatric symptoms

## 2021-10-20 NOTE — Progress Notes (Signed)
° °  10/20/21  CC: microhematuria   HPI: Ms Hartsough is a 51yo here for followup for microhematuria. CT hematuria shows a thick bladder wall but no other abnormalities Blood pressure (!) 157/81, pulse 76, weight 226 lb (102.5 kg), last menstrual period 09/19/2016. NED. A&Ox3.   No respiratory distress   Abd soft, NT, ND Normal external genitalia with patent urethral meatus  Cystoscopy Procedure Note  Patient identification was confirmed, informed consent was obtained, and patient was prepped using Betadine solution.  Lidocaine jelly was administered per urethral meatus.    Procedure: - Flexible cystoscope introduced, without any difficulty.   - Thorough search of the bladder revealed:    normal urethral meatus    normal urothelium    no stones    no ulcers     no tumors    no urethral polyps    no trabeculation  - Ureteral orifices were normal in position and appearance.  Post-Procedure: - Patient tolerated the procedure well  Assessment/ Plan: RTC 6 months with UA   No follow-ups on file.  Nicolette Bang, MD

## 2021-10-22 DIAGNOSIS — E113511 Type 2 diabetes mellitus with proliferative diabetic retinopathy with macular edema, right eye: Secondary | ICD-10-CM | POA: Diagnosis not present

## 2021-10-22 DIAGNOSIS — H31093 Other chorioretinal scars, bilateral: Secondary | ICD-10-CM | POA: Diagnosis not present

## 2021-11-26 ENCOUNTER — Ambulatory Visit: Payer: BC Managed Care – PPO | Admitting: Internal Medicine

## 2021-12-21 ENCOUNTER — Other Ambulatory Visit: Payer: Self-pay | Admitting: Cardiology

## 2021-12-21 ENCOUNTER — Other Ambulatory Visit: Payer: Self-pay | Admitting: Internal Medicine

## 2021-12-22 ENCOUNTER — Other Ambulatory Visit: Payer: Self-pay

## 2021-12-22 MED ORDER — AMLODIPINE BESYLATE 10 MG PO TABS: 10.0000 mg | ORAL_TABLET | Freq: Every day | ORAL | 3 refills | Status: DC

## 2022-01-06 NOTE — Progress Notes (Signed)
? ?Cardiology Office Note ? ? ?Date:  01/07/2022  ? ?ID:  Alexis Landry, DOB Feb 11, 1971, MRN [id number] ? ?PCP:  [name], PA-C  ?Cardiologist:   [name], MD  ? ?Pt presents for follow up of HTN   ?History of Present Illness: ?Alexis Landry is a 51 y.o. female with a history of HTN, HL, DM who is followed at [facility]  She also has a hx of chest pressure / heaviness   In 2021 was seen in ED .BP elevated   Trop neg  D dimer mildly elevated but CT neg for PE   Small pericardial effusion noted   She was placed on naprosyn and lasix  ? ?Echo Feb 2022 Normal   Carotid USN normal    BP was elevated and amlodpine  added    ? ?Diet:  1 meal per day usually  Does eat carbs    ? ?I saw the pt in Nov 2022 ? ?Dec 2022   Was working on weekend     Sunday night kept having to sit down   COuldnt breathe      Put pillows behind her.  Per the history from this hospitalization she actually woke up and had to sit up in bed.  And had shortness of breath, wheezing, some lower extremity edema.  She was initially admitted to [facility] but then transferred to [facility].  There she underwent a left heart catheterization.  This showed 30% distal RCA stenosis.  LVEF was normal.  LVEDP was 20.  Echocardiogram was done that was also normal.  Noted left ventricular end-diastolic pressure was elevated. ?Since discharge the patient has felt okay.  She denies chest pressure.  Breathing is good.  She remains active,  still losing weight. ?Current Meds  ?Medication Sig  ? amLODipine (NORVASC) 10 MG tablet Take 1 tablet (10 mg total) by mouth daily.  ? atorvastatin (LIPITOR) 40 MG tablet TAKE 1 TABLET BY MOUTH EVERY DAY IN THE EVENING  ? cloNIDine HCl (KAPVAY) 0.1 MG TB12 ER tablet Take 1/2 Tablet Daily at night  ? FARXIGA 10 MG TABS tablet Take 10 mg by mouth every morning.  ? furosemide (LASIX) 20 MG tablet Take 20 mg by mouth daily.  ? losartan-hydrochlorothiazide (HYZAAR) 100-25 MG tablet Take 1 tablet by mouth  daily.  ? meloxicam (MOBIC) 15 MG tablet Take 15 mg by mouth daily as needed for pain.  ? metFORMIN (GLUCOPHAGE-XR) 500 MG 24 hr tablet Take 1,000 mg by mouth in the morning and at bedtime.  ? ofloxacin (OCUFLOX) 0.3 % ophthalmic solution Place 1 drop into both eyes every 4 (four) hours.  ? pantoprazole (PROTONIX) 40 MG tablet Take 1 tablet (40 mg total) by mouth daily.  ? TRULICITY 1.5 WC/5.8NI SOPN Inject 1.5 mg into the skin once a week.  ? [DISCONTINUED] cloNIDine HCl (KAPVAY) 0.1 MG TB12 ER tablet Take 0.1 mg by mouth at bedtime.  ? ? ? ?Allergies:   Lisinopril and Sulfa antibiotics  ? ?Past Medical History:  ?Diagnosis Date  ? Diabetic neuropathy ([place])   ? DM (diabetes mellitus), type 2, uncontrolled   ? Epiretinal membrane, right   ? GERD (gastroesophageal reflux disease)   ? HTN (hypertension)   ? Hyperlipidemia   ? Macular edema, diabetic ([place])   ? Rheumatoid arthritis ([place])   ? Vitamin D deficiency   ? ? ?Past Surgical History:  ?Procedure Laterality Date  ? COLONOSCOPY N/A 01/04/2017  ? Procedure: COLONOSCOPY;  Surgeon: Danie Binder, MD;  [facility];  Service: Endoscopy;  Laterality: N/A;  10:30am  ? KENALOG INJECTION Right 12/03/2018  ? Procedure: LUCENTIS 0.'3MG'$  INJECTION;  Surgeon: Sherlynn Stalls, MD;  Location: [place];  Service: Ophthalmology;  Laterality: Right;  ? LEFT HEART CATH AND CORONARY ANGIOGRAPHY N/A 09/14/2021  ? Procedure: LEFT HEART CATH AND CORONARY ANGIOGRAPHY;  Surgeon: Troy Sine, MD;  Location: [place] [facility];  Service: Cardiovascular;  Laterality: N/A;  ? MEMBRANE PEEL Right 12/03/2018  ? Procedure: MEMBRANE PEEL;  Surgeon: Sherlynn Stalls, MD;  Location: [place];  Service: Ophthalmology;  Laterality: Right;  ? PARS PLANA VITRECTOMY Right 12/03/2018  ? Procedure: PARS PLANA VITRECTOMY WITH 25 GAUGE;  Surgeon: Sherlynn Stalls, MD;  Location: [place];  Service: Ophthalmology;  Laterality: Right;  ? PHOTOCOAGULATION WITH LASER Right 12/03/2018  ? Procedure: PHOTOCOAGULATION WITH  LASER;  Surgeon: Sherlynn Stalls, MD;  Location: [place];  Service: Ophthalmology;  Laterality: Right;  ? TUBAL LIGATION    ? ? ? ?Social History:  The patient  reports that she has never smoked. She has never used smokeless tobacco. She reports current alcohol use. She reports that she does not use drugs.  ? ?Family History:  The patient's family history includes Diabetes in her maternal grandfather, paternal grandfather, and paternal grandmother; Heart disease in her mother; Kidney disease in her father.  ? ? ?ROS:  Please see the history of present illness. All other systems are reviewed and  Negative to the above problem except as noted.  ? ? ?PHYSICAL EXAM: ?VS:  BP 126/70   Pulse 91   Ht '5\' 6"'$  (1.676 m)   Wt 207 lb (93.9 kg)   LMP 09/19/2016 (Approximate)   SpO2 96%   BMI 33.41 kg/m?   ?GEN: Obese 51 yo in no acute distress  ?HEENT: normal  ?Neck: no JVD, ?Cardiac: RRR; no murmurs  No LE  edema  ?Respiratory:  clear to auscultation bilaterally, ?GI: soft, nontender, nondistended, + BS   ?MS: no deformity Moving all extremities   ?Skin: warm and dry, no rash ?Neuro:  Strength and sensation are intact ?Psych: euthymic mood, full affect ? ? ?EKG:  EKG is not ordered today. ? ? ?Echo  11/10/20 ? ?1. Left ventricular ejection fraction, by estimation, is 65 to 70%. The left ventricle has normal ?function. The left ventricle has no regional wall motion abnormalities. There is mild left ?ventricular hypertrophy. Left ventricular diastolic parameters are indeterminate. Global ?longitudinal strain measurement low normal at -16.4% and does not track myocardium ?completely, suspect higher actual measurment. ?2. Right ventricular systolic function is normal. The right ventricular size is normal. There is ?normal pulmonary artery systolic pressure. The estimated right ventricular systolic pressure is ?85.8 mmHg. ?3. Left atrial size was upper normal. ?4. There is a trivial pericardial effusion that is circumferential. ?5.  The mitral valve is grossly normal. Mild mitral valve regurgitation. ?6. The aortic valve is tricuspid. Aortic valve regurgitation is not visualized. ?7. The inferior vena cava is normal in size with greater than 50% respiratory variability, ?suggesting right atrial pressure of 3 mmHg. ? ?11/10/20  Carotid USN ? ?Normal   ? ? ?Lipid Panel ?   ?Component Value Date/Time  ? CHOL 175 09/14/2021 0450  ? TRIG 102 09/14/2021 0450  ? HDL 55 09/14/2021 0450  ? CHOLHDL 3.2 09/14/2021 0450  ? VLDL 20 09/14/2021 0450  ? LDLCALC 100 (H) 09/14/2021 0450  ? ?  ? ?Wt Readings  from Last 3 Encounters:  ?01/07/22 207 lb (93.9 kg)  ?10/20/21 226 lb (102.5 kg)  ?09/15/21 229 lb 4.5 oz (104 kg)  ?  ? ? ?ASSESSMENT AND PLAN: ? ?1 chest pressure.  Patient had a sudden onset in December.  ?Left heart catheterization showed minimal plaquing.  Her LVEDP was elevated.  This may be her problem.  I have told her if she has any spells like this she could try taking 2 furosemide (40 mg total) to see how she feels before coming to the hospital. ? ?2  HTN blood pressures controlled.  I think as her weight continues to go down may be able to back down further.  She could try taking a half a clonidine at night.   ? ? ?4  Neck murmur   Carotid USN was normal    ? ?5 HL  Keep on lipitor we will check lipids (lipo med panel, APO B, Lp(a)) today ? ?6   Obesity congratulated her on continued weight loss.  Again limit carbs limit sugars. ? ?Plan for f/u next winter ? ?Current medicines are reviewed at length with the patient today.  The patient does not have concerns regarding medicines. ? ?Signed, ?[name], MD  ?01/07/2022 10:26 AM    ?[facility] ?[address], [place], [place]  [address] ?Phone: [phone]; Fax: [phone]  ? ? ?

## 2022-01-07 ENCOUNTER — Ambulatory Visit (INDEPENDENT_AMBULATORY_CARE_PROVIDER_SITE_OTHER): Payer: 59 | Admitting: Internal Medicine

## 2022-01-07 ENCOUNTER — Encounter: Payer: Self-pay | Admitting: Internal Medicine

## 2022-01-07 ENCOUNTER — Other Ambulatory Visit: Payer: Self-pay

## 2022-01-07 ENCOUNTER — Other Ambulatory Visit: Payer: Self-pay | Admitting: Internal Medicine

## 2022-01-07 VITALS — BP 126/70 | HR 91 | Ht 66.0 in | Wt 207.0 lb

## 2022-01-07 DIAGNOSIS — R079 Chest pain, unspecified: Secondary | ICD-10-CM

## 2022-01-07 MED ORDER — CLONIDINE HCL ER 0.1 MG PO TB12: ORAL_TABLET | ORAL | 3 refills | Status: DC

## 2022-01-07 MED ORDER — AMLODIPINE BESYLATE 10 MG PO TABS: 10.0000 mg | ORAL_TABLET | Freq: Every day | ORAL | 3 refills | Status: DC

## 2022-01-07 NOTE — Patient Instructions (Signed)
Medication Instructions:  ?Decrease Clonidine to 1/2 Tablet Nightly  ? ?*If you need a refill on your cardiac medications before your next appointment, please call your pharmacy* ? ? ?Lab Work: ?NONE  ? ?If you have labs (blood work) drawn today and your tests are completely normal, you will receive your results only by: ?MyChart Message (if you have MyChart) OR ?A paper copy in the mail ?If you have any lab test that is abnormal or we need to change your treatment, we will call you to review the results. ? ? ?Testing/Procedures: ?NONE  ? ? ?Follow-Up: ?At [facility], you and your health needs are our priority.  As part of our continuing mission to provide you with exceptional heart care, we have created designated Provider Care Teams.  These Care Teams include your primary Cardiologist (physician) and Advanced Practice Providers (APPs -  Physician Assistants and Nurse Practitioners) who all work together to provide you with the care you need, when you need it. ? ?We recommend signing up for the patient portal called "MyChart".  Sign up information is provided on this After Visit Summary.  MyChart is used to connect with patients for Virtual Visits (Telemedicine).  Patients are able to view lab/test results, encounter notes, upcoming appointments, etc.  Non-urgent messages can be sent to your provider as well.   ?To learn more about what you can do with MyChart, go to [email or web address].   ? ?Your next appointment:   ? January  ? ?The format for your next appointment:   ?In Person ? ?Provider:   ?[name], MD  ? ? ?Other Instructions ?Thank you for choosing [facility]! ? ? ? ?

## 2022-02-20 IMAGING — MG DIGITAL SCREENING BILAT W/ TOMO W/ CAD
6 of 11 series · 6 of 31 positions shown · non-contrast
Comparison: Previous exam(s).

CLINICAL DATA: Screening.

EXAM:
DIGITAL SCREENING BILATERAL MAMMOGRAM WITH TOMO AND CAD

[L CC]
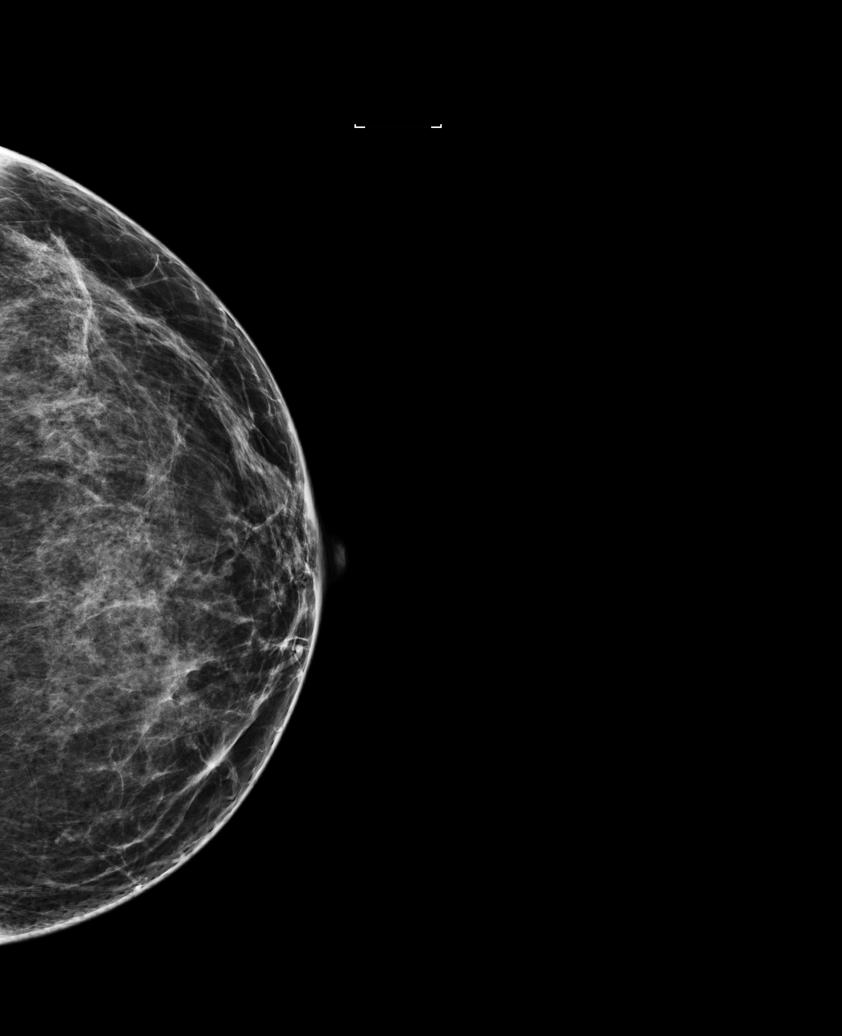

[R CC synth-2D (1 of 2)]
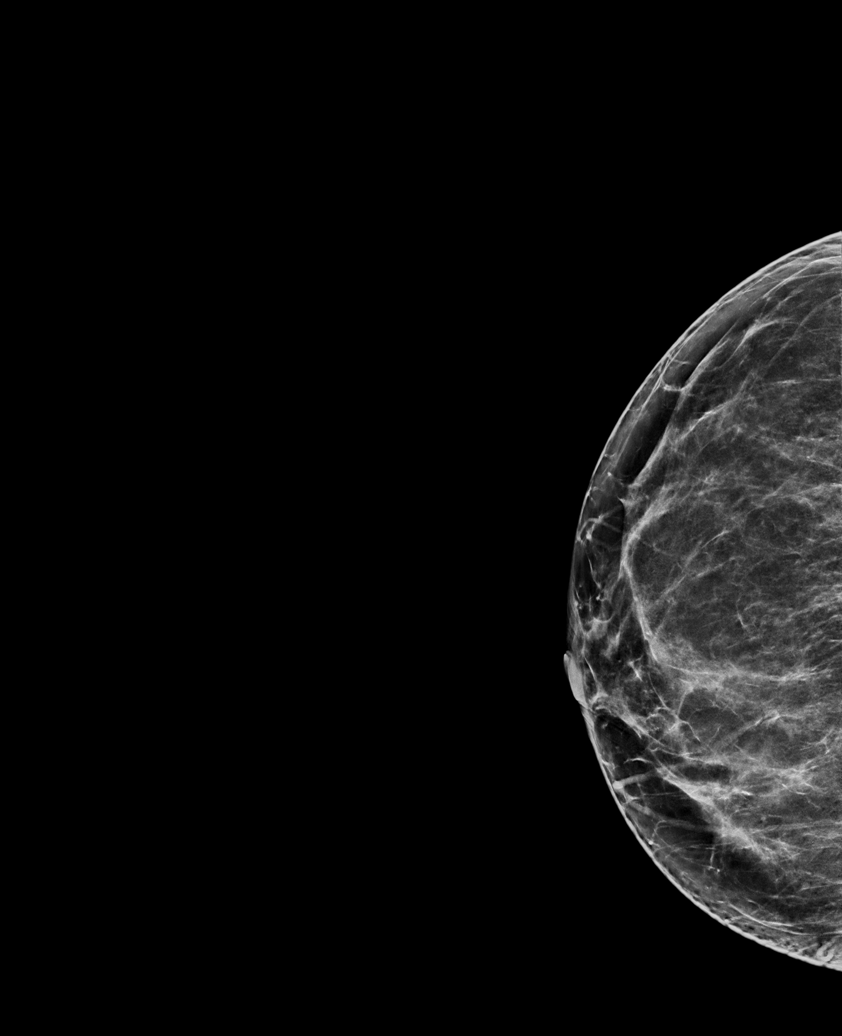

[R CC synth-2D (2 of 2)]
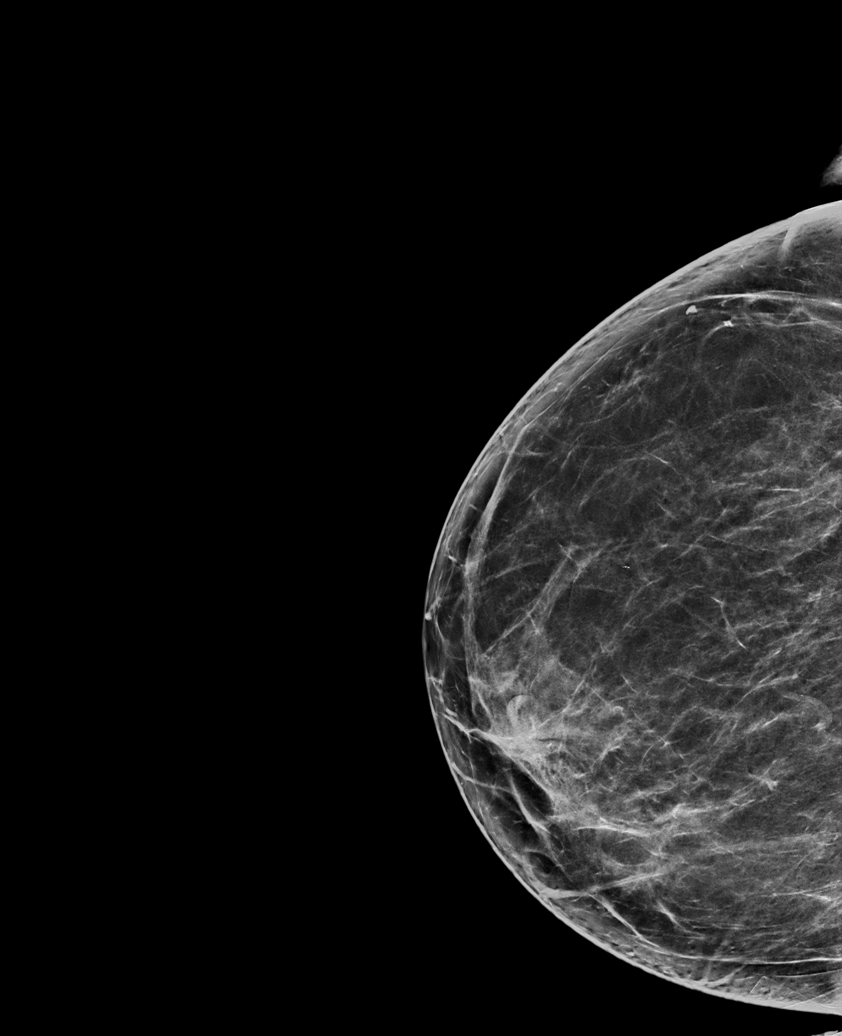

[L MLO synth-2D]
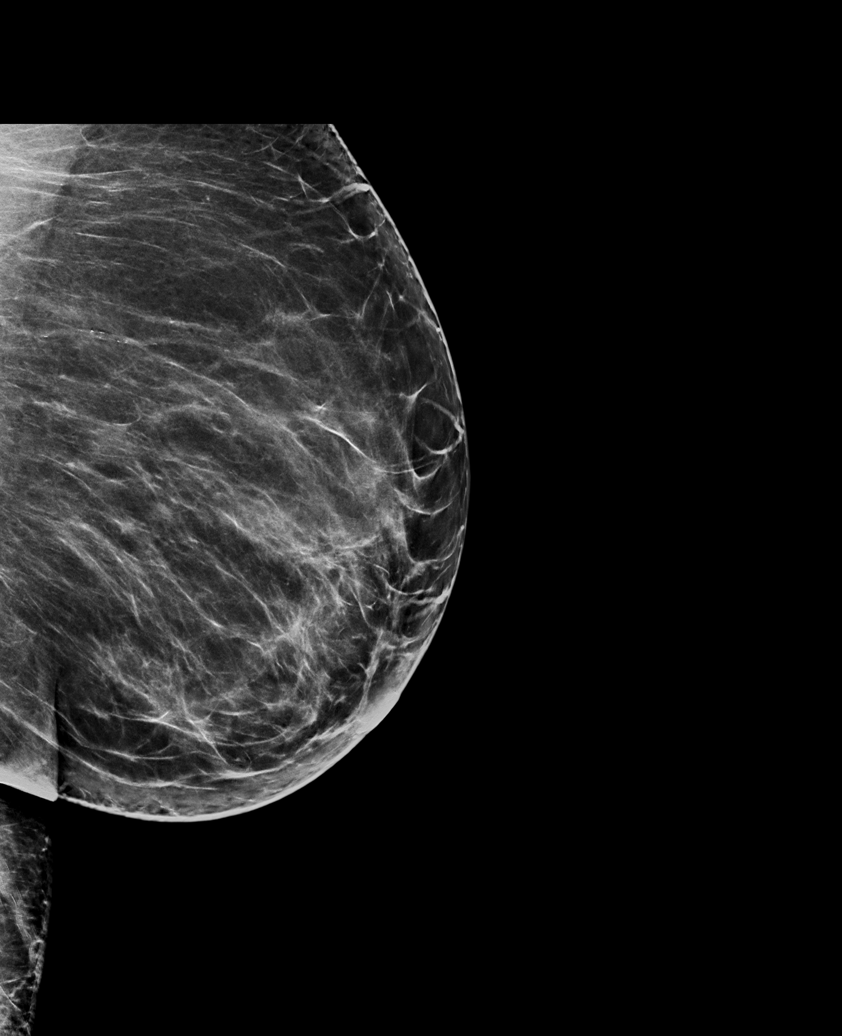

[L CC synth-2D]
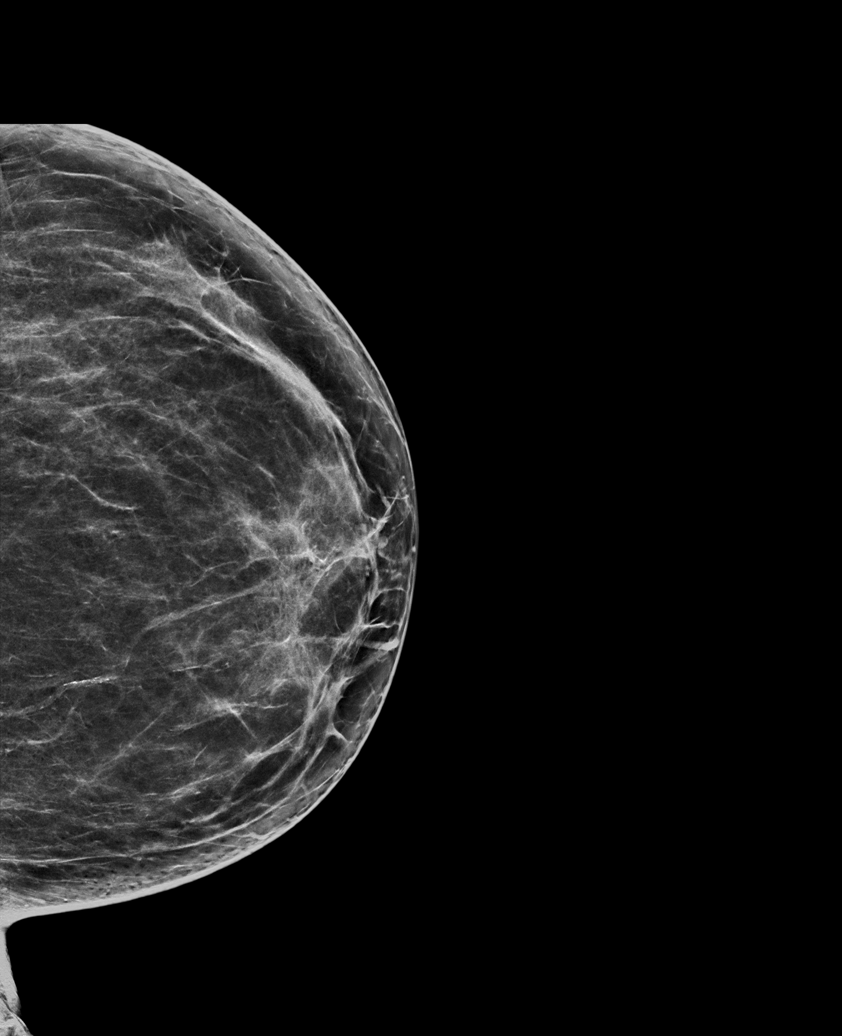

[R MLO synth-2D]
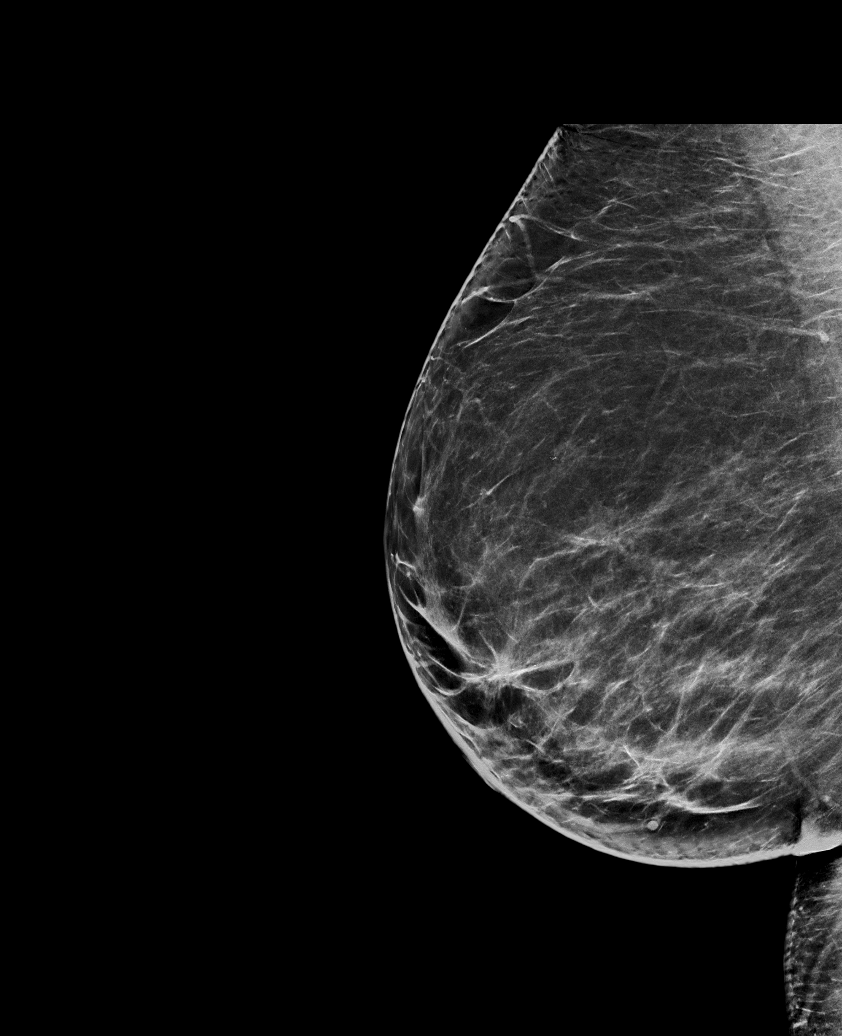

[6 of 31 positions shown; findings below may reference images not displayed]

ACR Breast Density Category b: There are scattered areas of
fibroglandular density.
FINDINGS: There are no findings suspicious for malignancy. Images were
processed with CAD.
IMPRESSION: No mammographic evidence of malignancy. A result letter of this
screening mammogram will be mailed directly to the patient.

RECOMMENDATION:
Screening mammogram in one year. (Code:[id number])

BI-RADS CATEGORY  1: Negative.

## 2022-03-23 ENCOUNTER — Other Ambulatory Visit: Payer: Self-pay | Admitting: Internal Medicine

## 2022-03-25 LAB — SPECIMEN STATUS REPORT

## 2022-03-28 ENCOUNTER — Other Ambulatory Visit: Payer: Self-pay | Admitting: *Deleted

## 2022-03-28 DIAGNOSIS — E785 Hyperlipidemia, unspecified: Secondary | ICD-10-CM

## 2022-03-28 LAB — NMR LIPOPROFILE
Cholesterol, Total: 245 mg/dL — ABNORMAL HIGH (ref 100–199)
HDL Particle Number: 38.6 umol/L (ref >=30.5)
HDL-C: 70 mg/dL (ref >39)
LDL Particle Number: 1589 nmol/L — ABNORMAL HIGH (ref <1000)
LDL Size: 21.5 nm (ref >20.5)
LDL-C (NIH Calc): 151 mg/dL — ABNORMAL HIGH (ref 0–99)
Small LDL Particle Number: 479 nmol/L (ref <=527)
Triglycerides: 136 mg/dL (ref 0–149)

## 2022-03-28 LAB — LIPOPROTEIN A (LPA): Lipoprotein (a): 46.3 nmol/L (ref <75.0)

## 2022-03-28 LAB — APOLIPOPROTEIN B: Apolipoprotein B: 105 mg/dL — ABNORMAL HIGH (ref <90)

## 2022-04-11 LAB — CYTOLOGY - PAP

## 2022-04-19 ENCOUNTER — Ambulatory Visit: Payer: BC Managed Care – PPO | Admitting: Urology

## 2022-04-21 ENCOUNTER — Ambulatory Visit: Payer: 59 | Admitting: Internal Medicine

## 2022-04-21 ENCOUNTER — Encounter: Payer: Self-pay | Admitting: Internal Medicine

## 2022-04-21 VITALS — BP 145/78 | HR 78 | Temp 97.3°F | Ht 66.0 in | Wt 194.4 lb

## 2022-04-21 DIAGNOSIS — R110 Nausea: Secondary | ICD-10-CM | POA: Diagnosis not present

## 2022-04-21 DIAGNOSIS — R1032 Left lower quadrant pain: Secondary | ICD-10-CM | POA: Diagnosis not present

## 2022-04-21 DIAGNOSIS — K581 Irritable bowel syndrome with constipation: Secondary | ICD-10-CM | POA: Diagnosis not present

## 2022-04-21 DIAGNOSIS — K219 Gastro-esophageal reflux disease without esophagitis: Secondary | ICD-10-CM

## 2022-04-21 NOTE — Patient Instructions (Signed)
For your chronic constipation, abdominal pain, nausea, I am going to give you samples of Linzess 290 mcg daily.  You may have an initial washout period with this medication though this should improve after a few days.  Let us know next week how you are doing and I will send in formal prescription.  We can decrease dose if needed.  Continue pantoprazole as needed for your chronic acid reflux.  Follow-up in 3 to 4 months.  It was very nice meeting today.  Dr. Abbey Chatters

## 2022-04-21 NOTE — Progress Notes (Signed)
Primary Care Physician:  Scherrie Bateman Primary Gastroenterologist:  Dr. Abbey Chatters  Chief Complaint  Patient presents with   Constipation    BM are not every day or week. Sometimes 2 weeks. Past few months fullness and not completely emptying.    HPI:   Alexis Landry is a 51 y.o. female who presents to the clinic today to discuss constipation.  Last seen 5 years ago.  Notes chronic constipation having 2-3 bowel movements per month.  She begins to get to the point of having a bowel movement notes associated abdominal fullness as well as lower abdominal pain.  Mild to moderate in severity.  Does not radiate.  Also with associated nausea.  No vomiting.  After bowel movement her symptoms do improve though then she restarts the cycle.  No melena hematochezia.  Has tried over-the-counter MiraLAX and Dulcolax without improvement in her symptoms.  Last colonoscopy 2018 with internal hemorrhoids, left-sided diverticulosis, otherwise unremarkable.  Recall 10 years.  No family history of colorectal malignancy.  Also with chronic GERD.  Symptoms mild, intermittent.  Takes pantoprazole as needed.  No dysphagia odynophagia.  No epigastric or chest pain.   Past Medical History:  Diagnosis Date   Diabetic neuropathy ([place])    DM (diabetes mellitus), type 2, uncontrolled    Epiretinal membrane, right    GERD (gastroesophageal reflux disease)    HTN (hypertension)    Hyperlipidemia    Macular edema, diabetic (HCC)    Rheumatoid arthritis ([place])    Vitamin D deficiency     Past Surgical History:  Procedure Laterality Date   COLONOSCOPY N/A 01/04/2017   Procedure: COLONOSCOPY;  Surgeon: Danie Binder, MD;  [facility];  Service: Endoscopy;  Laterality: N/A;  10:30am   KENALOG INJECTION Right 12/03/2018   Procedure: LUCENTIS 0.'3MG'$  INJECTION;  Surgeon: Sherlynn Stalls, MD;  Location: [facility];  Service: Ophthalmology;  Laterality: Right;   LEFT HEART CATH AND CORONARY ANGIOGRAPHY  N/A 09/14/2021   Procedure: LEFT HEART CATH AND CORONARY ANGIOGRAPHY;  Surgeon: Troy Sine, MD;  Location: [facility];  Service: Cardiovascular;  Laterality: N/A;   MEMBRANE PEEL Right 12/03/2018   Procedure: MEMBRANE PEEL;  Surgeon: Sherlynn Stalls, MD;  Location: [facility];  Service: Ophthalmology;  Laterality: Right;   PARS PLANA VITRECTOMY Right 12/03/2018   Procedure: PARS PLANA VITRECTOMY WITH 25 GAUGE;  Surgeon: Sherlynn Stalls, MD;  Location: [facility];  Service: Ophthalmology;  Laterality: Right;   PHOTOCOAGULATION WITH LASER Right 12/03/2018   Procedure: PHOTOCOAGULATION WITH LASER;  Surgeon: Sherlynn Stalls, MD;  Location: [facility];  Service: Ophthalmology;  Laterality: Right;   TUBAL LIGATION      Current Outpatient Medications  Medication Sig Dispense Refill   amLODipine (NORVASC) 10 MG tablet Take 1 tablet (10 mg total) by mouth daily. 90 tablet 3   atorvastatin (LIPITOR) 40 MG tablet TAKE 1 TABLET BY MOUTH EVERY DAY IN THE EVENING 90 tablet 1   cloNIDine HCl (KAPVAY) 0.1 MG TB12 ER tablet Take 1/2 Tablet Daily at night 45 tablet 3   FARXIGA 10 MG TABS tablet Take 10 mg by mouth every morning.     furosemide (LASIX) 20 MG tablet Take 20 mg by mouth daily.     losartan-hydrochlorothiazide (HYZAAR) 100-25 MG tablet Take 1 tablet by mouth daily.     meloxicam (MOBIC) 15 MG tablet Take 15 mg by mouth daily as needed for pain.     metFORMIN (GLUCOPHAGE-XR) 500 MG  24 hr tablet Take 1,000 mg by mouth in the morning and at bedtime.     ofloxacin (OCUFLOX) 0.3 % ophthalmic solution Place 1 drop into both eyes every 4 (four) hours.     TRULICITY 1.5 QM/0.8QP SOPN Inject 1.5 mg into the skin once a week.     pantoprazole (PROTONIX) 40 MG tablet Take 1 tablet (40 mg total) by mouth daily. (Patient not taking: Reported on 04/21/2022) 90 tablet 1   No current facility-administered medications for this visit.    Allergies as of 04/21/2022 - Review Complete 04/21/2022  Allergen Reaction Noted    Lisinopril Anaphylaxis 02/24/2015   Sulfa antibiotics Itching 08/03/2016    Family History  Problem Relation Age of Onset   Heart disease Mother    Kidney disease Father    Diabetes Maternal Grandfather    Diabetes Paternal Grandmother    Diabetes Paternal Grandfather    Colon cancer Neg Hx     Social History   Socioeconomic History   Marital status: Married    Spouse name: Not on file   Number of children: Not on file   Years of education: Not on file   Highest education level: Not on file  Occupational History   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  Tobacco Use   Smoking status: Never   Smokeless tobacco: Never  Vaping Use   Vaping Use: Never used  Substance and Sexual Activity   Alcohol use: Yes    Alcohol/week: 0.0 standard drinks of alcohol    Comment: two times per month   Drug use: No   Sexual activity: Not Currently    Birth control/protection: Surgical  Other Topics Concern   Not on file  Social History Narrative   Not on file   Social Determinants of Health   Financial Resource Strain: Not on file  Food Insecurity: Not on file  Transportation Needs: Not on file  Physical Activity: Not on file  Stress: Not on file  Social Connections: Not on file  Intimate Partner Violence: Not on file    Subjective: Review of Systems  Constitutional:  Negative for chills and fever.  HENT:  Negative for congestion and hearing loss.   Eyes:  Negative for blurred vision and double vision.  Respiratory:  Negative for cough and shortness of breath.   Cardiovascular:  Negative for chest pain and palpitations.  Gastrointestinal:  Positive for abdominal pain, constipation and nausea. Negative for blood in stool, diarrhea, heartburn, melena and vomiting.  Genitourinary:  Negative for dysuria and urgency.  Musculoskeletal:  Negative for joint pain and myalgias.  Skin:  Negative for itching and rash.  Neurological:  Negative for dizziness and headaches.   Psychiatric/Behavioral:  Negative for depression. The patient is not nervous/anxious.        Objective: BP (!) 145/78   Pulse 78   Temp (!) 97.3 F (36.3 C)   Ht '5\' 6"'$  (1.676 m)   Wt 194 lb 6.4 oz (88.2 kg)   LMP 09/19/2016 (Approximate)   BMI 31.38 kg/m  Physical Exam Constitutional:      Appearance: Normal appearance.  HENT:     Head: Normocephalic and atraumatic.  Eyes:     Extraocular Movements: Extraocular movements intact.     Conjunctiva/sclera: Conjunctivae normal.  Cardiovascular:     Rate and Rhythm: Normal rate and regular rhythm.  Pulmonary:     Effort: Pulmonary effort is normal.     Breath sounds: Normal breath sounds.  Abdominal:  General: Bowel sounds are normal.     Palpations: Abdomen is soft.  Musculoskeletal:        General: No swelling. Normal range of motion.     Cervical back: Normal range of motion and neck supple.  Skin:    General: Skin is warm and dry.     Coloration: Skin is not jaundiced.  Neurological:     General: No focal deficit present.     Mental Status: She is alert and oriented to person, place, and time.  Psychiatric:        Mood and Affect: Mood normal.        Behavior: Behavior normal.      Assessment: *Irritable bowel syndrome-constipation predominant  *Abdominal pain *Nausea *Chronic GERD-well controlled on Pantoprazole as needed  Plan: Patient's symptoms likely due to IBS constipation predominant.  We will give samples of Linzess 290 mcg daily and see how she does.  Call with update next week and we will send in formal prescription.  Counseled she may have an initial washout with this medication she understands.  Also counseled we can decrease the dose if this is too much.  GERD well-controlled on pantoprazole as needed.  Continue.  No alarm symptoms today to warrant further investigation with upper endoscopy.  Follow-up in 4 months.  04/21/2022 9:17 AM   Disclaimer: This note was dictated with voice  recognition software. Similar sounding words can inadvertently be transcribed and may not be corrected upon review.

## 2022-04-22 ENCOUNTER — Other Ambulatory Visit: Payer: Self-pay | Admitting: Family Medicine

## 2022-04-22 ENCOUNTER — Other Ambulatory Visit (HOSPITAL_COMMUNITY): Payer: Self-pay | Admitting: Family Medicine

## 2022-04-22 DIAGNOSIS — R0989 Other specified symptoms and signs involving the circulatory and respiratory systems: Secondary | ICD-10-CM

## 2022-04-22 DIAGNOSIS — E7849 Other hyperlipidemia: Secondary | ICD-10-CM | POA: Diagnosis not present

## 2022-04-22 DIAGNOSIS — E1165 Type 2 diabetes mellitus with hyperglycemia: Secondary | ICD-10-CM | POA: Diagnosis not present

## 2022-05-06 ENCOUNTER — Ambulatory Visit (HOSPITAL_COMMUNITY)
Admission: RE | Admit: 2022-05-06 | Discharge: 2022-05-06 | Disposition: A | Discharge status: Home / Self Care | Payer: 59 | Source: Ambulatory Visit | Attending: Family Medicine | Admitting: Family Medicine

## 2022-05-06 DIAGNOSIS — R0989 Other specified symptoms and signs involving the circulatory and respiratory systems: Secondary | ICD-10-CM | POA: Insufficient documentation

## 2022-05-27 ENCOUNTER — Other Ambulatory Visit: Payer: 59

## 2022-07-27 ENCOUNTER — Other Ambulatory Visit (HOSPITAL_COMMUNITY): Payer: Self-pay | Admitting: Family Medicine

## 2022-07-27 DIAGNOSIS — Z1231 Encounter for screening mammogram for malignant neoplasm of breast: Secondary | ICD-10-CM

## 2022-08-03 ENCOUNTER — Encounter: Payer: Self-pay | Admitting: *Deleted

## 2022-08-05 ENCOUNTER — Ambulatory Visit (HOSPITAL_COMMUNITY)
Admission: RE | Admit: 2022-08-05 | Discharge: 2022-08-05 | Disposition: A | Discharge status: Home / Self Care | Payer: Commercial Managed Care - HMO | Source: Ambulatory Visit | Attending: Family Medicine | Admitting: Family Medicine

## 2022-08-05 DIAGNOSIS — Z1231 Encounter for screening mammogram for malignant neoplasm of breast: Secondary | ICD-10-CM | POA: Insufficient documentation

## 2022-09-01 ENCOUNTER — Encounter: Payer: Self-pay | Admitting: Adult Health

## 2022-09-01 ENCOUNTER — Ambulatory Visit (INDEPENDENT_AMBULATORY_CARE_PROVIDER_SITE_OTHER): Payer: Commercial Managed Care - HMO | Admitting: Adult Health

## 2022-09-01 VITALS — BP 115/69 | HR 75 | Ht 66.0 in | Wt 189.0 lb

## 2022-09-01 DIAGNOSIS — Z780 Asymptomatic menopausal state: Secondary | ICD-10-CM

## 2022-09-01 DIAGNOSIS — R1031 Right lower quadrant pain: Secondary | ICD-10-CM | POA: Diagnosis not present

## 2022-09-01 DIAGNOSIS — N941 Unspecified dyspareunia: Secondary | ICD-10-CM | POA: Diagnosis not present

## 2022-09-01 NOTE — Progress Notes (Signed)
  Subjective:     Patient ID: Alexis Landry, female   DOB: Aug 17, 1971, 51 y.o.   MRN: [id number]  HPI [name] is a 51 year old black female, separated, PM in complaining of RLQ pain for 2 weeks and pain with sex.  Last pap about 2 weeks ago with PCP was normal but had trich  PCP is Delman Cheadle PA   Review of Systems RLQ pain for about 2 weeks, and had had pain with sex Recently treated for trich Denies any problem with urination has constipation and has seen GI Denies any vaginal bleeding  Reviewed past medical,surgical, social and family history. Reviewed medications and allergies.     Objective:   Physical Exam BP 115/69 (BP Location: Left Arm, Patient Position: Sitting, Cuff Size: Normal)   Pulse 75   Ht '5\' 6"'$  (1.676 m)   Wt 189 lb (85.7 kg)   LMP 09/19/2016 (Approximate)   BMI 30.51 kg/m     Skin warm and dry. Lungs: clear to ausculation bilaterally. Cardiovascular: regular rate and rhythm.  Pelvic: external genitalia is normal in appearance no lesions, vagina: white discharge without odor,urethra has no lesions or masses noted, cervix:smooth and bulbous, uterus: normal size, shape and contour, non tender, no masses felt, adnexa: no masses, + tenderness noted, R>L Bladder is non tender and no masses felt. AA I 3 Fall risk is low     09/01/2022    9:00 AM  Depression screen PHQ 2/9  Decreased Interest 0  Down, Depressed, Hopeless 0  PHQ - 2 Score 0  Altered sleeping 3  Tired, decreased energy 1  Change in appetite 0  Feeling bad or failure about yourself  0  Trouble concentrating 0  Moving slowly or fidgety/restless 0  Suicidal thoughts 0  PHQ-9 Score 4           09/01/2022    9:01 AM  GAD 7 : Generalized Anxiety Score  Nervous, Anxious, on Edge 0  Control/stop worrying 0  Worry too much - different things 0  Trouble relaxing 0  Restless 0  Easily annoyed or irritable 1  Afraid - awful might happen 0  Total GAD 7 Score 1      Upstream -  09/01/22 0859       Pregnancy Intention Screening   Does the patient want to become pregnant in the next year? N/A    Does the patient's partner want to become pregnant in the next year? N/A    Would the patient like to discuss contraceptive options today? N/A      Contraception Wrap Up   Current Method Female Sterilization   postmenopausal   End Method Female Sterilization   postmenopausal   Contraception Counseling Provided No             Examination chaperoned by Levy Pupa LPN     Impression and plan:    1. RLQ abdominal pain RLQ pain for about 2 weeks Will get pelvic [facility] 09/09/22 [facility] at 7:30 am to assess uterus and ovaries Will talk when results back  - US PELVIC COMPLETE WITH TRANSVAGINAL; Future  2. Dyspareunia in female Will get pelvic US to assess uterus and ovaries - US PELVIC COMPLETE WITH TRANSVAGINAL; Future  3. Postmenopause Denies any vaginal bleeding

## 2022-09-09 ENCOUNTER — Ambulatory Visit (HOSPITAL_COMMUNITY): Payer: Commercial Managed Care - HMO

## 2022-09-16 ENCOUNTER — Telehealth: Payer: Self-pay | Admitting: Adult Health

## 2022-09-16 ENCOUNTER — Ambulatory Visit (HOSPITAL_COMMUNITY)
Admission: RE | Admit: 2022-09-16 | Discharge: 2022-09-16 | Disposition: A | Discharge status: Home / Self Care | Payer: Commercial Managed Care - HMO | Source: Ambulatory Visit | Attending: Adult Health | Admitting: Adult Health

## 2022-09-16 DIAGNOSIS — N941 Unspecified dyspareunia: Secondary | ICD-10-CM | POA: Insufficient documentation

## 2022-09-16 DIAGNOSIS — R1031 Right lower quadrant pain: Secondary | ICD-10-CM | POA: Diagnosis present

## 2022-09-16 NOTE — Telephone Encounter (Signed)
Pt aware of [place] and EEC 9 mm will get endometrial biopsy scheduled

## 2022-09-28 LAB — HM DIABETES EYE EXAM

## 2022-09-30 LAB — HM DIABETES EYE EXAM

## 2022-10-07 LAB — HM DIABETES EYE EXAM

## 2022-10-11 ENCOUNTER — Other Ambulatory Visit (HOSPITAL_COMMUNITY)
Admission: RE | Admit: 2022-10-11 | Discharge: 2022-10-11 | Disposition: A | Discharge status: Home / Self Care | Payer: Commercial Managed Care - HMO | Source: Ambulatory Visit | Attending: Obstetrics & Gynecology | Admitting: Obstetrics & Gynecology

## 2022-10-11 ENCOUNTER — Encounter: Payer: Self-pay | Admitting: Obstetrics & Gynecology

## 2022-10-11 ENCOUNTER — Ambulatory Visit: Payer: Commercial Managed Care - HMO | Admitting: Obstetrics & Gynecology

## 2022-10-11 VITALS — BP 116/68 | HR 79 | Wt 188.0 lb

## 2022-10-11 DIAGNOSIS — R9389 Abnormal findings on diagnostic imaging of other specified body structures: Secondary | ICD-10-CM

## 2022-10-11 DIAGNOSIS — Z8619 Personal history of other infectious and parasitic diseases: Secondary | ICD-10-CM

## 2022-10-11 DIAGNOSIS — D250 Submucous leiomyoma of uterus: Secondary | ICD-10-CM

## 2022-10-11 DIAGNOSIS — Z09 Encounter for follow-up examination after completed treatment for conditions other than malignant neoplasm: Secondary | ICD-10-CM

## 2022-10-11 NOTE — Progress Notes (Signed)
GYN VISIT Patient name: Alexis Landry MRN [id number]  Date of birth: 02/14/1971 Chief Complaint:   Endometrial biopsy  History of Present Illness:   Alexis Landry is a 52 y.o. [id number] PM female being seen today for follow up regarding:  -Pelvic pain: In review, pain has been present for about a year.  Mostly a discomfort, but seems to be happening more frequently.  Pain occurs daily mostly at the bottom of her stomach.  No radiation.  Rates her symptoms 4-5/10.  On occasion will take tylenol and have no improvement.  She will also note pain with intercourse.  As part of work [place] sent patient for [place] (see below)  No vaginal bleeding.  Notes h/o discharge- s/p Trich treatment. Recent pap with PCP '[]'$  plan to obtain records  Pelvic US completed.  Normal ovaries bilaterally.  86m uterus- 2cm subserosal fibroid.  917mendometrium.  Constipation- typically has BM every week or so.  Pt seen by GI started on Linzess, but made her go too often so she is not taking it.  She is taking a stool softener every other day, which helps somewhat.    Patient's last menstrual period was 09/19/2016 (approximate).     09/01/2022    9:00 AM  Depression screen PHQ 2/9  Decreased Interest 0  Down, Depressed, Hopeless 0  PHQ - 2 Score 0  Altered sleeping 3  Tired, decreased energy 1  Change in appetite 0  Feeling bad or failure about yourself  0  Trouble concentrating 0  Moving slowly or fidgety/restless 0  Suicidal thoughts 0  PHQ-9 Score 4     Review of Systems:   Pertinent items are noted in HPI Denies fever/chills, dizziness, headaches, visual disturbances, fatigue, shortness of breath, chest pain, abdominal pain, vomiting. Pertinent History Reviewed:  Reviewed past medical,surgical, social, obstetrical and family history.  Reviewed problem list, medications and allergies. Physical Assessment:   Vitals:   10/11/22 1130  BP: 116/68  Pulse: 79  Weight: 188 lb (85.3 kg)  Body  mass index is 30.34 kg/m.       Physical Examination:   General appearance: alert, well appearing, and in no distress  Psych: mood appropriate, normal affect  Skin: warm & dry   Cardiovascular: RRR  Respiratory: normal respiratory effort, no distress  Abdomen: soft, non-tender, no rebound, no guarding  Pelvic: VULVA: normal appearing vulva with no masses, tenderness or lesions, VAGINA: normal appearing vagina with normal color, frothy white dishcarge, no lesions, CERVIX: normal appearing cervix without discharge or lesions, UTERUS: uterus is normal size, shape, consistency and nontender, no adnexal masses or tenderness noted on bimanual exam  Extremities: no edema   Chaperone: LaAlice Rieger  Endometrial Biopsy Procedure Note  Pre-operative Diagnosis: thickened endometrium  Post-operative Diagnosis: same  Procedure Details  The risks (including infection, bleeding, pain, and uterine perforation) and benefits of the procedure were explained to the patient and Written informed consent was obtained.  Antibiotic prophylaxis against endocarditis was not indicated.   The patient was placed in the dorsal lithotomy position.  Bimanual exam showed the uterus to be in the neutral position.  A speculum inserted in the vagina, and the cervix prepped with betadine.     A single tooth tenaculum was applied to the anterior lip of the cervix for stabilization.  A Pipelle endometrial aspirator was used to sample the endometrium.  Sample was sent for pathologic examination.  Condition: Stable  Complications: None  Assessment & Plan:  1) Thickened endometrium -discussed this as an incidental finding -EMB obtained to r/o underlying etiology, if negative no further work up warranted  2) Pelvic pain -reviewed pelvic US, no abnormalities noted -suspect pain is likely due to constipation/GI issues -advised follow up with Dr. Abbey Chatters  2) h/o Trich, vaginal discharge -vaginitis panel/Trich to  be completed -further management pending results  Return if symptoms worsen or fail to improve.   Janyth Pupa, DO Attending [place], [facility] for [employer], [place]

## 2022-10-12 LAB — CERVICOVAGINAL ANCILLARY ONLY
Comment: NEGATIVE
Trichomonas: NEGATIVE

## 2022-10-12 LAB — SURGICAL PATHOLOGY

## 2022-10-28 LAB — HM DIABETES EYE EXAM

## 2022-12-05 ENCOUNTER — Encounter: Payer: Self-pay | Admitting: Cardiology

## 2022-12-05 ENCOUNTER — Ambulatory Visit: Payer: Commercial Managed Care - HMO | Attending: Cardiology | Admitting: Cardiology

## 2022-12-05 VITALS — BP 120/60 | HR 73 | Ht 66.0 in | Wt 190.1 lb

## 2022-12-05 DIAGNOSIS — Z6830 Body mass index (BMI) 30.0-30.9, adult: Secondary | ICD-10-CM | POA: Diagnosis not present

## 2022-12-05 DIAGNOSIS — E785 Hyperlipidemia, unspecified: Secondary | ICD-10-CM | POA: Diagnosis not present

## 2022-12-05 DIAGNOSIS — Z794 Long term (current) use of insulin: Secondary | ICD-10-CM

## 2022-12-05 DIAGNOSIS — E13319 Other specified diabetes mellitus with unspecified diabetic retinopathy without macular edema: Secondary | ICD-10-CM

## 2022-12-05 DIAGNOSIS — I10 Essential (primary) hypertension: Secondary | ICD-10-CM

## 2022-12-05 MED ORDER — FUROSEMIDE 20 MG PO TABS: 20.0000 mg | ORAL_TABLET | Freq: Every day | ORAL | 11 refills | Status: DC

## 2022-12-05 NOTE — Progress Notes (Signed)
Cardiology Office Note:   Date:  12/05/2022  ID:  Alexis Landry, DOB 12/02/1970, MRN [id number]  History of Present Illness:   Alexis Landry is a 52 y.o. female with a history of hypertension, hyperlipidemia, type 2 diabetes, who is here today for 28-monthfollow-up on her hypertension.  She was last seen in clinic 01/07/2022 by Dr. RHarrington Challenger  Blood pressure medications were adjusted and furosemide was increased to 40 mg if needed.  There was no further testing that was needed at that time.  She returns today stating that she has been doing well.  She has continued to work on her weight loss journey and is lost in all over 50 pounds but states that she has gained approximately 10 of it back.  She has gotten her hemoglobin A1c from 14 down to 10 9 and is improved her cholesterol with her weight loss, exercise, dietary changes.  Blood pressure has been better controlled with her weight loss as well.  She denies any chest pain, shortness of breath, palpitations, or peripheral edema.  Denies any recent hospitalizations or visits to the emergency department.  ROS: 10 point review of systems is negative  Studies Reviewed:    EKG: Sinus rhythm with first-degree AV block rate of 73  Carotid Duplex 05/06/22 Color duplex indicates minimal heterogeneous plaque, with no hemodynamically significant stenosis by duplex criteria in the extracranial cerebrovascular circulation.  LHC 09/14/21   Dist RCA lesion is 30% stenosed.   The left ventricular systolic function is normal.   LV end diastolic pressure is normal.   The left ventricular ejection fraction is 55-65% by visual estimate.   No significant coronary obstructive disease with mild smooth  30% narrowing in the distal RCA proximal to the PDA takeoff.  The LAD and circumflex and the remainder of the the RCA appeared angiographically normal.   Normal LV function with EF estimated at 55 to 65% without focal segmental wall motion abnormalities.  LVEDP 12  mmHg.   RECOMMENDATION: Low-dose aspirin therapy.  Medical therapy for mild nonobstructive CAD in this patient with hypertension and hyperlipidemia.  Aim for target LDL less than 70.  Risk Assessment/Calculations:              Physical Exam:   VS:  BP 120/60 (BP Location: Left Arm, Patient Position: Sitting, Cuff Size: Large)   Pulse 73   Ht '5\' 6"'$  (1.676 m)   Wt 190 lb 2 oz (86.2 kg)   LMP 09/19/2016 (Approximate)   SpO2 98%   BMI 30.69 kg/m    Wt Readings from Last 3 Encounters:  12/05/22 190 lb 2 oz (86.2 kg)  10/11/22 188 lb (85.3 kg)  09/01/22 189 lb (85.7 kg)     GEN: Well nourished, well developed in no acute distress NECK: No JVD; No carotid bruits CARDIAC: RRR, no murmurs, rubs, gallops RESPIRATORY:  Clear to auscultation without rales, wheezing or rhonchi  ABDOMEN: Soft, non-tender, non-distended EXTREMITIES:  No edema; No deformity   ASSESSMENT AND PLAN:   Hypertension blood pressure today 120/60.  Has continued to be improved and stable as she continues to lose weight.  She is continued on her current medication regimen at this time of amlodipine, clonidine, furosemide, losartan-HCTZ.  We did discuss that she continues to lose weight and we can likely down titrate more of her medications.  She requires refills on her furosemide today.  Hyperlipidemia with LDL of 70 08/2022.  She has been continued on atorvastatin 40 mg daily.  This continues to be followed by her PCP.  Obesity with a BMI of 30.69.  She has been congratulated on her continued weight loss.  She is also been encouraged to continue on this journey with continuing with her exercise regimen dietary changes and her current medications.  Type 2 diabetes with last hemoglobin A1c of 9.  She is continued on all of her diabetic medications.  This continues to be followed by her PCP.  But she has been congratulated as her previous A1c was 14.  Disposition patient return to clinic to see MD/APP in 6 months or  sooner if needed.        Signed, Lawarence Meek, NP

## 2022-12-05 NOTE — Patient Instructions (Signed)
Medication Instructions:  Your physician recommends that you continue on your current medications as directed. Please refer to the Current Medication list given to you today.  *If you need a refill on your cardiac medications before your next appointment, please call your pharmacy*   Lab Work: NONE   If you have labs (blood work) drawn today and your tests are completely normal, you will receive your results only by: [place] (if you have MyChart) OR A paper copy in the mail If you have any lab test that is abnormal or we need to change your treatment, we will call you to review the results.   Testing/Procedures: NONE    Follow-Up: At [facility], you and your health needs are our priority.  As part of our continuing mission to provide you with exceptional heart care, we have created designated Provider Care Teams.  These Care Teams include your primary Cardiologist (physician) and Advanced Practice Providers (APPs -  Physician Assistants and Nurse Practitioners) who all work together to provide you with the care you need, when you need it.  We recommend signing up for the patient portal called "MyChart".  Sign up information is provided on this After Visit Summary.  MyChart is used to connect with patients for Virtual Visits (Telemedicine).  Patients are able to view lab/test results, encounter notes, upcoming appointments, etc.  Non-urgent messages can be sent to your provider as well.   To learn more about what you can do with MyChart, go to [email or web address].    Your next appointment:   6 month(s)  Provider:   You may see [name], MD or one of the following Advanced Practice Providers on your designated Care Team:   Bernerd Pho, PA-C  Ermalinda Barrios, PA-C     Other Instructions Thank you for choosing [facility]!

## 2022-12-16 ENCOUNTER — Ambulatory Visit (INDEPENDENT_AMBULATORY_CARE_PROVIDER_SITE_OTHER): Payer: Commercial Managed Care - HMO | Admitting: Family Medicine

## 2022-12-16 ENCOUNTER — Encounter: Payer: Self-pay | Admitting: Family Medicine

## 2022-12-16 VITALS — BP 128/78 | HR 76 | Ht 66.0 in | Wt 188.0 lb

## 2022-12-16 DIAGNOSIS — E039 Hypothyroidism, unspecified: Secondary | ICD-10-CM | POA: Diagnosis not present

## 2022-12-16 DIAGNOSIS — E785 Hyperlipidemia, unspecified: Secondary | ICD-10-CM | POA: Diagnosis not present

## 2022-12-16 DIAGNOSIS — I10 Essential (primary) hypertension: Secondary | ICD-10-CM | POA: Diagnosis not present

## 2022-12-16 DIAGNOSIS — Z1159 Encounter for screening for other viral diseases: Secondary | ICD-10-CM

## 2022-12-16 DIAGNOSIS — R7301 Impaired fasting glucose: Secondary | ICD-10-CM | POA: Diagnosis not present

## 2022-12-16 NOTE — Progress Notes (Signed)
New Patient Office Visit   Subjective   Patient ID: [name], female    DOB: Jul 10, 1971  Age: 52 y.o. MRN: [id number]  CC:  Chief Complaint  Patient presents with   Establish Care   Hypertension   Diabetes    HPI Ghada A Eischeid presents to establish care. She  has a past medical history of Diabetic neuropathy ([place]), DM (diabetes mellitus), type 2, uncontrolled, Epiretinal membrane, right, GERD (gastroesophageal reflux disease), HTN (hypertension), Hyperlipidemia, Macular edema, diabetic ([place]), Rheumatoid arthritis ([place]), and Vitamin D deficiency.  Patient here for hypertension management. She is exercising and is not adherent to low salt diet.  Blood pressure unknown if controlled at home. Patient denies cardiac symptoms chest pain, chest pressure/discomfort, dyspnea, lower extremity edema, and syncope.Cardiovascular risk factors: diabetes mellitus, dyslipidemia, hypertension, and obesity (BMI >= 30 kg/m2).      Outpatient Encounter Medications as of 12/16/2022  Medication Sig   ALPRAZolam (XANAX) 0.5 MG tablet Take 0.5 mg by mouth at bedtime.   amLODipine (NORVASC) 10 MG tablet Take 1 tablet (10 mg total) by mouth daily.   atorvastatin (LIPITOR) 40 MG tablet TAKE 1 TABLET BY MOUTH EVERY DAY IN THE EVENING   cloNIDine HCl (KAPVAY) 0.1 MG TB12 ER tablet Take 1/2 Tablet Daily at night   dorzolamide (TRUSOPT) 2 % ophthalmic solution 1 drop 2 (two) times daily.   FARXIGA 10 MG TABS tablet Take 10 mg by mouth every morning.   furosemide (LASIX) 20 MG tablet Take 1 tablet (20 mg total) by mouth daily.   glipiZIDE (GLUCOTROL) 10 MG tablet Take 10 mg by mouth 2 (two) times daily.   losartan-hydrochlorothiazide (HYZAAR) 100-25 MG tablet Take 1 tablet by mouth daily.   meloxicam (MOBIC) 15 MG tablet Take 15 mg by mouth daily as needed for pain.   metFORMIN (GLUCOPHAGE-XR) 500 MG 24 hr tablet Take 1,000 mg by mouth in the morning and at bedtime.   ofloxacin (OCUFLOX) 0.3 %  ophthalmic solution Place 1 drop into both eyes every 4 (four) hours.   pantoprazole (PROTONIX) 40 MG tablet Take 1 tablet (40 mg total) by mouth daily.   timolol (TIMOPTIC) 0.5 % ophthalmic solution 1 drop 2 (two) times daily.   TRULICITY 1.5 0000000 SOPN Inject 3 mg into the skin once a week. Patient on 3 mg   Vitamin D, Ergocalciferol, (DRISDOL) 1.25 MG (50000 UNIT) CAPS capsule Take 50,000 Units by mouth once a week.   No facility-administered encounter medications on file as of 12/16/2022.    Past Surgical History:  Procedure Laterality Date   COLONOSCOPY N/A 01/04/2017   Procedure: COLONOSCOPY;  Surgeon: Danie Binder, MD;  [facility];  Service: Endoscopy;  Laterality: N/A;  10:30am   KENALOG INJECTION Right 12/03/2018   Procedure: LUCENTIS 0.3MG  INJECTION;  Surgeon: Sherlynn Stalls, MD;  Location: [place];  Service: Ophthalmology;  Laterality: Right;   LEFT HEART CATH AND CORONARY ANGIOGRAPHY N/A 09/14/2021   Procedure: LEFT HEART CATH AND CORONARY ANGIOGRAPHY;  Surgeon: Troy Sine, MD;  Location: [place] [facility];  Service: Cardiovascular;  Laterality: N/A;   MEMBRANE PEEL Right 12/03/2018   Procedure: MEMBRANE PEEL;  Surgeon: Sherlynn Stalls, MD;  Location: [place];  Service: Ophthalmology;  Laterality: Right;   PARS PLANA VITRECTOMY Right 12/03/2018   Procedure: PARS PLANA VITRECTOMY WITH 25 GAUGE;  Surgeon: Sherlynn Stalls, MD;  Location: [place];  Service: Ophthalmology;  Laterality: Right;   PHOTOCOAGULATION WITH LASER Right 12/03/2018  Procedure: PHOTOCOAGULATION WITH LASER;  Surgeon: Sherlynn Stalls, MD;  Location: [place];  Service: Ophthalmology;  Laterality: Right;   TUBAL LIGATION      Review of Systems  Constitutional:  Negative for chills and fever.  Respiratory:  Negative for shortness of breath.   Cardiovascular:  Negative for chest pain.  Gastrointestinal:  Negative for abdominal pain, nausea and vomiting.  Neurological:  Negative for dizziness and headaches.       Objective    BP 128/78   Pulse 76   Ht 5\' 6"  (1.676 m)   Wt 188 lb (85.3 kg)   LMP 09/19/2016 (Approximate)   SpO2 98%   BMI 30.34 kg/m   Physical Exam Cardiovascular:     Rate and Rhythm: Normal rate and regular rhythm.     Pulses: Normal pulses.  Pulmonary:     Effort: Pulmonary effort is normal.     Breath sounds: Normal breath sounds.  Abdominal:     General: Bowel sounds are normal.     Palpations: Abdomen is soft. There is no mass.     Tenderness: There is no right CVA tenderness or left CVA tenderness.  Musculoskeletal:        General: Normal range of motion.     Cervical back: Normal range of motion.     Right lower leg: No edema.     Left lower leg: No edema.  Skin:    General: Skin is warm and dry.  Neurological:     General: No focal deficit present.     Mental Status: She is alert.  Psychiatric:        Mood and Affect: Mood normal.       Assessment & Plan:  Primary hypertension Assessment & Plan: Vitals:   12/16/22 1506 12/16/22 1513  BP: (!) 150/79 128/78  Patient currently taking amlodipine 10 mg, clonidine 0,1 mg, losartan-Hctz 100-25 mg, Labs ordered in today's visit Explained non pharmacological interventions such as low salt, DASH diet discussed. Educated on stress reduction and physical activity. Discussed signs and symptoms of major cardiovascular event and need to present to the ED. Follow up in 2 weeks with blood pressure reading logs. Patient verbalizes understanding regarding plan of care and all questions answered.   Orders: -     CMP14+EGFR -     CBC with Differential/Platelet -     Microalbumin / creatinine urine ratio  IFG (impaired fasting glucose) -     Hemoglobin A1c  Hypothyroidism, unspecified type -     TSH + free T4  Hyperlipidemia, unspecified hyperlipidemia type -     Lipid panel  Need for hepatitis C screening test -     Hepatitis C antibody    Return in about 2 weeks (around 12/30/2022) for hypertension,  re-check blood pressure.   Renard Hamper Ria Comment, FNP

## 2022-12-16 NOTE — Patient Instructions (Signed)
It was pleasure meeting with you today. Please take medications as prescribed. Follow up with your primary health provider if any health concerns arises. If symptoms worsen please contact your primary care provider and/or visit the emergency department.  

## 2022-12-16 NOTE — Assessment & Plan Note (Addendum)
Vitals:   12/16/22 1506 12/16/22 1513  BP: (!) 150/79 128/78  Patient currently taking amlodipine 10 mg, clonidine 0,1 mg, losartan-Hctz 100-25 mg, Labs ordered in today's visit Explained non pharmacological interventions such as low salt, DASH diet discussed. Educated on stress reduction and physical activity. Discussed signs and symptoms of major cardiovascular event and need to present to the ED. Follow up in 2 weeks with blood pressure reading logs. Patient verbalizes understanding regarding plan of care and all questions answered.

## 2022-12-22 ENCOUNTER — Other Ambulatory Visit: Payer: Self-pay | Admitting: Family Medicine

## 2022-12-23 ENCOUNTER — Other Ambulatory Visit: Payer: Self-pay | Admitting: Family Medicine

## 2022-12-23 LAB — LIPID PANEL
Chol/HDL Ratio: 2.3 ratio (ref 0.0–4.4)
Cholesterol, Total: 160 mg/dL (ref 100–199)
HDL: 69 mg/dL (ref >39)
LDL Chol Calc (NIH): 72 mg/dL (ref 0–99)
Triglycerides: 108 mg/dL (ref 0–149)
VLDL Cholesterol Cal: 19 mg/dL (ref 5–40)

## 2022-12-23 LAB — CBC WITH DIFFERENTIAL/PLATELET
Basophils Absolute: 0 10*3/uL (ref 0.0–0.2)
Basos: 0 %
EOS (ABSOLUTE): 0.1 10*3/uL (ref 0.0–0.4)
Eos: 3 %
Hematocrit: 37.9 % (ref 34.0–46.6)
Hemoglobin: 12.4 g/dL (ref 11.1–15.9)
Immature Grans (Abs): 0 10*3/uL (ref 0.0–0.1)
Immature Granulocytes: 0 %
Lymphocytes Absolute: 2.5 10*3/uL (ref 0.7–3.1)
Lymphs: 56 %
MCH: 28.7 pg (ref 26.6–33.0)
MCHC: 32.7 g/dL (ref 31.5–35.7)
MCV: 88 fL (ref 79–97)
Monocytes Absolute: 0.3 10*3/uL (ref 0.1–0.9)
Monocytes: 7 %
Neutrophils Absolute: 1.5 10*3/uL (ref 1.4–7.0)
Neutrophils: 34 %
Platelets: 313 10*3/uL (ref 150–450)
RBC: 4.32 x10E6/uL (ref 3.77–5.28)
RDW: 13.4 % (ref 11.7–15.4)
WBC: 4.5 10*3/uL (ref 3.4–10.8)

## 2022-12-23 LAB — TSH+FREE T4
Free T4: 1.12 ng/dL (ref 0.82–1.77)
TSH: 2.09 u[IU]/mL (ref 0.450–4.500)

## 2022-12-23 LAB — CMP14+EGFR
ALT: 11 IU/L (ref 0–32)
AST: 16 IU/L (ref 0–40)
Albumin/Globulin Ratio: 1.6 (ref 1.2–2.2)
Albumin: 4.2 g/dL (ref 3.8–4.9)
Alkaline Phosphatase: 56 IU/L (ref 44–121)
BUN/Creatinine Ratio: 12 (ref 9–23)
BUN: 16 mg/dL (ref 6–24)
Bilirubin Total: 1.2 mg/dL (ref 0.0–1.2)
CO2: 26 mmol/L (ref 20–29)
Calcium: 10 mg/dL (ref 8.7–10.2)
Chloride: 102 mmol/L (ref 96–106)
Creatinine, Ser: 1.35 mg/dL — ABNORMAL HIGH (ref 0.57–1.00)
Globulin, Total: 2.6 g/dL (ref 1.5–4.5)
Glucose: 107 mg/dL — ABNORMAL HIGH (ref 70–99)
Potassium: 3.9 mmol/L (ref 3.5–5.2)
Sodium: 142 mmol/L (ref 134–144)
Total Protein: 6.8 g/dL (ref 6.0–8.5)
eGFR: 47 mL/min/{1.73_m2} — ABNORMAL LOW (ref >59)

## 2022-12-23 LAB — HEMOGLOBIN A1C
Est. average glucose Bld gHb Est-mCnc: 177 mg/dL
Hgb A1c MFr Bld: 7.8 % — ABNORMAL HIGH (ref 4.8–5.6)

## 2022-12-23 LAB — MICROALBUMIN / CREATININE URINE RATIO
Creatinine, Urine: 324.4 mg/dL
Microalb/Creat Ratio: 45 mg/g creat — ABNORMAL HIGH (ref 0–29)
Microalbumin, Urine: 146.4 ug/mL

## 2022-12-23 LAB — HEPATITIS C ANTIBODY: Hep C Virus Ab: NONREACTIVE

## 2022-12-23 MED ORDER — TRULICITY 4.5 MG/0.5ML ~~LOC~~ SOAJ: 4.5000 mg | SUBCUTANEOUS | 2 refills | Status: DC

## 2022-12-23 NOTE — Addendum Note (Signed)
Addended by: Juanda Chance on: 12/23/2022 05:59 PM   Modules accepted: Orders

## 2022-12-30 ENCOUNTER — Encounter: Payer: Self-pay | Admitting: Family Medicine

## 2022-12-30 ENCOUNTER — Encounter: Payer: Commercial Managed Care - HMO | Admitting: Family Medicine

## 2023-01-02 NOTE — Progress Notes (Signed)
Erroneous encounter

## 2023-01-04 ENCOUNTER — Encounter: Payer: Self-pay | Admitting: Family Medicine

## 2023-01-04 ENCOUNTER — Ambulatory Visit (INDEPENDENT_AMBULATORY_CARE_PROVIDER_SITE_OTHER): Payer: Commercial Managed Care - HMO | Admitting: Family Medicine

## 2023-01-04 VITALS — BP 132/72 | HR 78 | Ht 66.0 in | Wt 186.0 lb

## 2023-01-04 DIAGNOSIS — I10 Essential (primary) hypertension: Secondary | ICD-10-CM | POA: Diagnosis not present

## 2023-01-04 MED ORDER — FARXIGA 10 MG PO TABS: 10.0000 mg | ORAL_TABLET | Freq: Every morning | ORAL | 3 refills | Status: DC

## 2023-01-04 NOTE — Assessment & Plan Note (Signed)
Vitals:   01/04/23 1551 01/04/23 1600  BP: 123/75 132/72   At home reading include 132/72, 129/69, 130/70, 119/69, 126/71 Blood pressure controlled Continue on current regimen, amlodipine 10 mg, clonidine 0.1 mg, Losartan- HCTZ 100-25 mg Follow up 3 months

## 2023-01-04 NOTE — Progress Notes (Signed)
Patient Office Visit   Subjective   Patient ID: Alexis Landry, female    DOB: 1971-09-16  Age: 52 y.o. MRN: [id number]  CC:  Chief Complaint  Patient presents with   Hypertension    Patient is here for HTN f/u. Has been checking it at home with range around 130/75.    HPI Alexis Landry 52 year old female, presents to the clinic for hypertension follow up. She  has a past medical history of Diabetic neuropathy, DM (diabetes mellitus), type 2, uncontrolled, Epiretinal membrane, right, GERD (gastroesophageal reflux disease), HTN (hypertension), Hyperlipidemia, Macular edema, diabetic, Rheumatoid arthritis, and Vitamin D deficiency.  Hypertension The problem has been gradually improving since onset. The problem is controlled. Pertinent negatives include no blurred vision, chest pain, headaches, neck pain or shortness of breath. Risk factors for coronary artery disease include diabetes mellitus, dyslipidemia and obesity. Past treatments include angiotensin blockers, calcium channel blockers, diuretics and lifestyle changes. The current treatment provides significant improvement. There are no compliance problems.  Identifiable causes of hypertension include chronic renal disease.      Outpatient Encounter Medications as of 01/04/2023  Medication Sig   ALPRAZolam (XANAX) 0.5 MG tablet Take 0.5 mg by mouth at bedtime.   amLODipine (NORVASC) 10 MG tablet Take 1 tablet (10 mg total) by mouth daily.   atorvastatin (LIPITOR) 40 MG tablet TAKE 1 TABLET BY MOUTH EVERY DAY IN THE EVENING   cloNIDine HCl (KAPVAY) 0.1 MG TB12 ER tablet Take 1/2 Tablet Daily at night   dorzolamide (TRUSOPT) 2 % ophthalmic solution 1 drop 2 (two) times daily.   Dulaglutide (TRULICITY) 4.5 0000000 SOPN Inject 4.5 mg as directed once a week.   glipiZIDE (GLUCOTROL) 10 MG tablet Take 10 mg by mouth 2 (two) times daily.   losartan-hydrochlorothiazide (HYZAAR) 100-25 MG tablet Take 1 tablet by mouth daily.   meloxicam  (MOBIC) 15 MG tablet Take 15 mg by mouth daily as needed for pain.   metFORMIN (GLUCOPHAGE-XR) 500 MG 24 hr tablet Take 1,000 mg by mouth in the morning and at bedtime.   ofloxacin (OCUFLOX) 0.3 % ophthalmic solution Place 1 drop into both eyes every 4 (four) hours.   pantoprazole (PROTONIX) 40 MG tablet Take 1 tablet (40 mg total) by mouth daily.   timolol (TIMOPTIC) 0.5 % ophthalmic solution 1 drop 2 (two) times daily.   Vitamin D, Ergocalciferol, (DRISDOL) 1.25 MG (50000 UNIT) CAPS capsule Take 50,000 Units by mouth once a week.   [DISCONTINUED] FARXIGA 10 MG TABS tablet Take 10 mg by mouth every morning.   FARXIGA 10 MG TABS tablet Take 1 tablet (10 mg total) by mouth every morning.   No facility-administered encounter medications on file as of 01/04/2023.    Past Surgical History:  Procedure Laterality Date   COLONOSCOPY N/A 01/04/2017   Procedure: COLONOSCOPY;  Surgeon: Danie Binder, MD;  [facility];  Service: Endoscopy;  Laterality: N/A;  10:30am   KENALOG INJECTION Right 12/03/2018   Procedure: LUCENTIS 0.3MG  INJECTION;  Surgeon: Sherlynn Stalls, MD;  Location: [place];  Service: Ophthalmology;  Laterality: Right;   LEFT HEART CATH AND CORONARY ANGIOGRAPHY N/A 09/14/2021   Procedure: LEFT HEART CATH AND CORONARY ANGIOGRAPHY;  Surgeon: Troy Sine, MD;  Location: [place] [facility];  Service: Cardiovascular;  Laterality: N/A;   MEMBRANE PEEL Right 12/03/2018   Procedure: MEMBRANE PEEL;  Surgeon: Sherlynn Stalls, MD;  Location: [place];  Service: Ophthalmology;  Laterality: Right;   PARS  PLANA VITRECTOMY Right 12/03/2018   Procedure: PARS PLANA VITRECTOMY WITH 25 GAUGE;  Surgeon: Sherlynn Stalls, MD;  Location: [place];  Service: Ophthalmology;  Laterality: Right;   PHOTOCOAGULATION WITH LASER Right 12/03/2018   Procedure: PHOTOCOAGULATION WITH LASER;  Surgeon: Sherlynn Stalls, MD;  Location: [place];  Service: Ophthalmology;  Laterality: Right;   TUBAL LIGATION      Review of Systems   Constitutional:  Negative for chills and fever.  Eyes:  Negative for blurred vision.  Respiratory:  Negative for shortness of breath.   Cardiovascular:  Negative for chest pain.  Gastrointestinal:  Negative for abdominal pain.  Genitourinary:  Negative for dysuria.  Musculoskeletal:  Negative for neck pain.  Neurological:  Negative for dizziness and headaches.      Objective    BP 132/72   Pulse 78   Ht 5\' 6"  (1.676 m)   Wt 186 lb (84.4 kg)   LMP 09/19/2016 (Approximate)   SpO2 98%   BMI 30.02 kg/m   Physical Exam HENT:     Head: Normocephalic.  Eyes:     Conjunctiva/sclera: Conjunctivae normal.  Cardiovascular:     Rate and Rhythm: Normal rate and regular rhythm.     Pulses: Normal pulses.     Heart sounds: Normal heart sounds.  Pulmonary:     Effort: Pulmonary effort is normal.     Breath sounds: Normal breath sounds.  Musculoskeletal:        General: Normal range of motion.     Cervical back: Normal range of motion and neck supple.  Skin:    General: Skin is warm and dry.     Capillary Refill: Capillary refill takes less than 2 seconds.  Neurological:     General: No focal deficit present.     Mental Status: She is alert.     Motor: No weakness.     Coordination: Coordination normal.     Gait: Gait normal.  Psychiatric:        Mood and Affect: Mood normal.        Behavior: Behavior normal.        Thought Content: Thought content normal.       Assessment & Plan:  Primary hypertension Assessment & Plan: Vitals:   01/04/23 1551 01/04/23 1600  BP: 123/75 132/72   At home reading include 132/72, 129/69, 130/70, 119/69, 126/71 Blood pressure controlled Continue on current regimen, amlodipine 10 mg, clonidine 0.1 mg, Losartan- HCTZ 100-25 mg Follow up 3 months   Other orders -     Farxiga; Take 1 tablet (10 mg total) by mouth every morning.  Dispense: 30 tablet; Refill: 3    Return in about 3 months (around 04/05/2023) for hypertension, diabetes,  chronic follow-up.   Renard Hamper Ria Comment, FNP

## 2023-01-04 NOTE — Patient Instructions (Signed)
It was pleasure meeting with you today. Please take medications as prescribed. Follow up with your primary health provider if any health concerns arises. If symptoms worsen please contact your primary care provider and/or visit the emergency department.  

## 2023-01-11 ENCOUNTER — Encounter: Payer: Self-pay | Admitting: Family Medicine

## 2023-01-12 ENCOUNTER — Other Ambulatory Visit: Payer: Self-pay | Admitting: Family Medicine

## 2023-01-12 ENCOUNTER — Other Ambulatory Visit: Payer: Self-pay

## 2023-01-12 MED ORDER — TRULICITY 3 MG/0.5ML ~~LOC~~ SOAJ: 3.0000 mg | SUBCUTANEOUS | 0 refills | Status: DC

## 2023-01-12 MED ORDER — AMLODIPINE BESYLATE 10 MG PO TABS: 10.0000 mg | ORAL_TABLET | Freq: Every day | ORAL | 3 refills | Status: DC

## 2023-01-12 NOTE — Telephone Encounter (Signed)
Refills sent , please let patient know needs to make an appointment for STI screening

## 2023-01-20 ENCOUNTER — Encounter: Payer: Self-pay | Admitting: Family Medicine

## 2023-01-20 ENCOUNTER — Ambulatory Visit (INDEPENDENT_AMBULATORY_CARE_PROVIDER_SITE_OTHER): Payer: Commercial Managed Care - HMO | Admitting: Family Medicine

## 2023-01-20 VITALS — BP 112/70 | HR 73 | Ht 66.0 in | Wt 185.1 lb

## 2023-01-20 DIAGNOSIS — F5101 Primary insomnia: Secondary | ICD-10-CM | POA: Diagnosis not present

## 2023-01-20 DIAGNOSIS — G4700 Insomnia, unspecified: Secondary | ICD-10-CM | POA: Insufficient documentation

## 2023-01-20 DIAGNOSIS — Z202 Contact with and (suspected) exposure to infections with a predominantly sexual mode of transmission: Secondary | ICD-10-CM | POA: Diagnosis not present

## 2023-01-20 MED ORDER — ALPRAZOLAM 0.5 MG PO TABS: 0.5000 mg | ORAL_TABLET | Freq: Every day | ORAL | 0 refills | Status: DC

## 2023-01-20 NOTE — Progress Notes (Signed)
Patient Office Visit   Subjective   Patient ID: Alexis Landry, female    DOB: [birth year]  Age: 52 y.o. MRN: [id number]  CC:  Chief Complaint  Patient presents with   Exposure to STD    Exposure to trichomonas.    Medication Refill    Would like a refill on alprazolam    HPI Alexis Landry 52 year old female, presents to the clinic for STI screening and Insomnia. She  has a past medical history of Diabetic neuropathy, DM (diabetes mellitus), type 2, uncontrolled, Epiretinal membrane, right, GERD (gastroesophageal reflux disease), HTN (hypertension), Hyperlipidemia, Macular edema, diabetic, Rheumatoid arthritis, and Vitamin D deficiency.  Insomnia Primary symptoms: sleep disturbance, difficulty falling asleep, frequent awakening.   The problem occurs nightly. The problem has been gradually worsening since onset. The symptoms are aggravated by caffeine. How many beverages per day that contain caffeine: 2-3.  Types of beverages you drink: coffee and soda. The symptoms are relieved by quiet environment, darkened room and certain positions. Typical bedtime:  8-10 P.M..  How long after going to bed to you fall asleep: over an hour.   PMH includes: hypertension.       Outpatient Encounter Medications as of 01/20/2023  Medication Sig   amLODipine (NORVASC) 10 MG tablet Take 1 tablet (10 mg total) by mouth daily.   atorvastatin (LIPITOR) 40 MG tablet TAKE 1 TABLET BY MOUTH EVERY DAY IN THE EVENING   cloNIDine HCl (KAPVAY) 0.1 MG TB12 ER tablet Take 1/2 Tablet Daily at night   dorzolamide (TRUSOPT) 2 % ophthalmic solution 1 drop 2 (two) times daily.   Dulaglutide (TRULICITY) 3 MG/0.5ML SOPN Inject 3 mg as directed once a week.   FARXIGA 10 MG TABS tablet Take 1 tablet (10 mg total) by mouth every morning.   glipiZIDE (GLUCOTROL) 10 MG tablet Take 10 mg by mouth 2 (two) times daily.   losartan-hydrochlorothiazide (HYZAAR) 100-25 MG tablet Take 1 tablet by mouth daily.   meloxicam  (MOBIC) 15 MG tablet Take 15 mg by mouth daily as needed for pain.   metFORMIN (GLUCOPHAGE-XR) 500 MG 24 hr tablet Take 1,000 mg by mouth in the morning and at bedtime.   ofloxacin (OCUFLOX) 0.3 % ophthalmic solution Place 1 drop into both eyes every 4 (four) hours.   pantoprazole (PROTONIX) 40 MG tablet Take 1 tablet (40 mg total) by mouth daily.   timolol (TIMOPTIC) 0.5 % ophthalmic solution 1 drop 2 (two) times daily.   Vitamin D, Ergocalciferol, (DRISDOL) 1.25 MG (50000 UNIT) CAPS capsule Take 50,000 Units by mouth once a week.   [DISCONTINUED] ALPRAZolam (XANAX) 0.5 MG tablet Take 0.5 mg by mouth at bedtime.   ALPRAZolam (XANAX) 0.5 MG tablet Take 1 tablet (0.5 mg total) by mouth at bedtime.   No facility-administered encounter medications on file as of 01/20/2023.    Past Surgical History:  Procedure Laterality Date   COLONOSCOPY N/A 01/04/2017   Procedure: COLONOSCOPY;  Surgeon: West Bali, MD;  [facility];  Service: Endoscopy;  Laterality: N/A;  10:30am   KENALOG INJECTION Right 12/03/2018   Procedure: LUCENTIS 0.3MG  INJECTION;  Surgeon: Stephannie Li, MD;  [facility];  Service: Ophthalmology;  Laterality: Right;   LEFT HEART CATH AND CORONARY ANGIOGRAPHY N/A 09/14/2021   Procedure: LEFT HEART CATH AND CORONARY ANGIOGRAPHY;  Surgeon: Lennette Bihari, MD;  [facility];  Service: Cardiovascular;  Laterality: N/A;   MEMBRANE PEEL Right 12/03/2018   Procedure: MEMBRANE  PEEL;  Surgeon: Stephannie Li, MD;  Location: [facility];  Service: Ophthalmology;  Laterality: Right;   PARS PLANA VITRECTOMY Right 12/03/2018   Procedure: PARS PLANA VITRECTOMY WITH 25 GAUGE;  Surgeon: Stephannie Li, MD;  Location: [facility] OR;  Service: Ophthalmology;  Laterality: Right;   PHOTOCOAGULATION WITH LASER Right 12/03/2018   Procedure: PHOTOCOAGULATION WITH LASER;  Surgeon: Stephannie Li, MD;  Location: [facility] OR;  Service: Ophthalmology;  Laterality: Right;   TUBAL LIGATION      Review  of Systems  Constitutional:  Negative for chills and fever.  HENT:  Negative for hearing loss.   Eyes:  Negative for blurred vision.  Respiratory:  Negative for shortness of breath.   Cardiovascular:  Negative for chest pain.  Gastrointestinal:  Negative for abdominal pain.  Genitourinary:  Negative for dysuria, flank pain, frequency, hematuria and urgency.  Musculoskeletal:  Negative for myalgias.  Neurological:  Negative for dizziness and headaches.  Psychiatric/Behavioral:  Positive for sleep disturbance. The patient has insomnia.       Objective    BP 112/70   Pulse 73   Ht  (1.676 m)   Wt 185 lb 1.9 oz (84 kg)   LMP 09/19/2016 (Approximate)   SpO2 97%   BMI 29.88 kg/m   Physical Exam Vitals reviewed.  Constitutional:      General: She is not in acute distress.    Appearance: Normal appearance. She is not ill-appearing, toxic-appearing or diaphoretic.  HENT:     Head: Normocephalic.  Eyes:     General:        Right eye: No discharge.        Left eye: No discharge.     Conjunctiva/sclera: Conjunctivae normal.  Cardiovascular:     Rate and Rhythm: Normal rate.     Pulses: Normal pulses.     Heart sounds: Normal heart sounds.  Pulmonary:     Effort: Pulmonary effort is normal. No respiratory distress.     Breath sounds: Normal breath sounds.  Musculoskeletal:        General: Normal range of motion.     Cervical back: Normal range of motion.  Skin:    General: Skin is warm and dry.     Capillary Refill: Capillary refill takes less than 2 seconds.  Neurological:     General: No focal deficit present.     Mental Status: She is alert and oriented to person, place, and time.     Coordination: Coordination normal.     Gait: Gait normal.  Psychiatric:        Mood and Affect: Mood normal.        Behavior: Behavior normal.       Assessment & Plan:  Exposure to STD -     NuSwab Vaginitis Plus (VG+)  Primary insomnia Assessment & Plan: Refilled Xanax 0.5  mg at bedtime  Explained to go to bed at the same time each night and get up at the same time each morning, including on the weekends. Make sure your bedroom is quiet, dark, relaxing, and at a comfortable temperature. Remove electronic devices, such as TVs, computers, and smart phones, from the bedroom.   Orders: -     ALPRAZolam; Take 1 tablet (0.5 mg total) by mouth at bedtime.  Dispense: 20 tablet; Refill: 0  Possible exposure to STI Assessment & Plan: Patient reported unprotected sex and partner may have trichomoniasis. NuSwab ordered- awaiting results, will treat accordingly  Advise to uses condoms in each sexual  encounter to prevent HIV and STD. Use water-based or silicone-based lubricants to help prevent condoms from breaking or slipping during sex.     No follow-ups on file.   Cruzita Lederer Newman Nip, FNP

## 2023-01-20 NOTE — Assessment & Plan Note (Signed)
Patient reported unprotected sex and partner may have trichomoniasis. NuSwab ordered- awaiting results, will treat accordingly  Advise to uses condoms in each sexual encounter to prevent HIV and STD. Use water-based or silicone-based lubricants to help prevent condoms from breaking or slipping during sex.

## 2023-01-20 NOTE — Patient Instructions (Signed)
It was pleasure meeting with you today. Please take medications as prescribed. Follow up with your primary health provider if any health concerns arises.  

## 2023-01-20 NOTE — Assessment & Plan Note (Signed)
Refilled Xanax 0.5 mg at bedtime  Explained to go to bed at the same time each night and get up at the same time each morning, including on the weekends. Make sure your bedroom is quiet, dark, relaxing, and at a comfortable temperature. Remove electronic devices, such as TVs, computers, and smart phones, from the bedroom.

## 2023-01-24 ENCOUNTER — Other Ambulatory Visit: Payer: Self-pay | Admitting: Family Medicine

## 2023-01-24 LAB — NUSWAB VAGINITIS PLUS (VG+)
Candida albicans, NAA: NEGATIVE
Candida glabrata, NAA: NEGATIVE
Chlamydia trachomatis, NAA: NEGATIVE
Neisseria gonorrhoeae, NAA: NEGATIVE
Trich vag by NAA: POSITIVE — AB

## 2023-01-24 MED ORDER — METRONIDAZOLE 500 MG PO TABS: 500.0000 mg | ORAL_TABLET | Freq: Two times a day (BID) | ORAL | 0 refills | Status: AC

## 2023-01-27 ENCOUNTER — Other Ambulatory Visit: Payer: Self-pay | Admitting: Family Medicine

## 2023-01-27 DIAGNOSIS — F5101 Primary insomnia: Secondary | ICD-10-CM

## 2023-01-27 NOTE — Telephone Encounter (Signed)
I sent in medications one week ago

## 2023-02-02 ENCOUNTER — Encounter: Payer: Self-pay | Admitting: Family Medicine

## 2023-02-03 ENCOUNTER — Other Ambulatory Visit: Payer: Self-pay | Admitting: Family Medicine

## 2023-02-03 MED ORDER — TRULICITY 4.5 MG/0.5ML ~~LOC~~ SOAJ: 4.5000 mg | SUBCUTANEOUS | 0 refills | Status: DC

## 2023-02-08 ENCOUNTER — Encounter (HOSPITAL_COMMUNITY): Payer: Self-pay

## 2023-02-08 ENCOUNTER — Other Ambulatory Visit: Payer: Self-pay

## 2023-02-08 ENCOUNTER — Emergency Department (HOSPITAL_COMMUNITY): Payer: Commercial Managed Care - HMO

## 2023-02-08 ENCOUNTER — Emergency Department (HOSPITAL_COMMUNITY)
Admission: EM | Admit: 2023-02-08 | Discharge: 2023-02-08 | Disposition: A | Discharge status: Home / Self Care | Payer: Commercial Managed Care - HMO | Attending: Emergency Medicine | Admitting: Emergency Medicine

## 2023-02-08 DIAGNOSIS — E119 Type 2 diabetes mellitus without complications: Secondary | ICD-10-CM | POA: Diagnosis not present

## 2023-02-08 DIAGNOSIS — S0990XA Unspecified injury of head, initial encounter: Secondary | ICD-10-CM | POA: Diagnosis present

## 2023-02-08 DIAGNOSIS — Z7984 Long term (current) use of oral hypoglycemic drugs: Secondary | ICD-10-CM | POA: Diagnosis not present

## 2023-02-08 DIAGNOSIS — I10 Essential (primary) hypertension: Secondary | ICD-10-CM | POA: Diagnosis not present

## 2023-02-08 DIAGNOSIS — Z79899 Other long term (current) drug therapy: Secondary | ICD-10-CM | POA: Insufficient documentation

## 2023-02-08 DIAGNOSIS — S0083XA Contusion of other part of head, initial encounter: Secondary | ICD-10-CM | POA: Diagnosis not present

## 2023-02-08 DIAGNOSIS — S060X0A Concussion without loss of consciousness, initial encounter: Secondary | ICD-10-CM | POA: Insufficient documentation

## 2023-02-08 MED ORDER — KETOROLAC TROMETHAMINE 30 MG/ML IJ SOLN
30.0000 mg | Freq: Once | INTRAMUSCULAR | Status: AC
  Administered 2023-02-08: 30 mg via INTRAMUSCULAR
  Filled 2023-02-08: qty 1

## 2023-02-08 MED ORDER — HYDROCODONE-ACETAMINOPHEN 5-325 MG PO TABS: 1.0000 | ORAL_TABLET | ORAL | 0 refills | Status: DC | PRN

## 2023-02-08 MED ORDER — IBUPROFEN 600 MG PO TABS: 600.0000 mg | ORAL_TABLET | Freq: Four times a day (QID) | ORAL | 0 refills | Status: DC | PRN
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
## 2023-02-10 ENCOUNTER — Telehealth: Payer: Self-pay
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
## 2023-02-16 ENCOUNTER — Ambulatory Visit: Payer: Commercial Managed Care - HMO | Admitting: Sports Medicine

## 2023-02-16 VITALS — BP 130/84 | HR 70 | Ht 66.0 in | Wt 186.0 lb

## 2023-02-16 DIAGNOSIS — G44319 Acute post-traumatic headache, not intractable: Secondary | ICD-10-CM

## 2023-02-16 DIAGNOSIS — S060X0A Concussion without loss of consciousness, initial encounter: Secondary | ICD-10-CM

## 2023-02-16 DIAGNOSIS — M542 Cervicalgia: Secondary | ICD-10-CM | POA: Diagnosis not present

## 2023-02-16 MED ORDER — MELOXICAM 15 MG PO TABS: 15.0000 mg | ORAL_TABLET | Freq: Every day | ORAL | 0 refills | Status: DC

## 2023-02-16 NOTE — Patient Instructions (Addendum)
Good to see you  -           Relative mental and physical rest for 48 hours after concussive event -           Recommend light aerobic activity while keeping symptoms less than 3/10 -           Stop mental or physical activities that cause symptoms to worsen greater than 3/10, and wait 24 hours before attempting them again -           Eliminate screen time as much as possible for first 48 hours after concussive event, then continue limited screen time (recommend less than 2 hours per day) - Start meloxicam 15 mg daily x2 weeks.  If still having pain after 2 weeks, complete 3rd-week of meloxicam. May use remaining meloxicam as needed once daily for pain control.  Do not to use additional NSAIDs while taking meloxicam.  May use Tylenol [phone] mg 2 to 3 times a day for breakthrough pain. Neck HEP  Work note less than 2 hours of screen time a day . Can take a 15 minute break every hour 1 week 1 week follow up
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
## 2023-02-20 ENCOUNTER — Encounter: Payer: Self-pay | Admitting: Family Medicine
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
## 2023-02-23 ENCOUNTER — Ambulatory Visit: Payer: Commercial Managed Care - HMO | Admitting: Sports Medicine

## 2023-02-23 VITALS — BP 100/78 | HR 67 | Ht 66.0 in | Wt 194.0 lb

## 2023-02-23 DIAGNOSIS — R270 Ataxia, unspecified: Secondary | ICD-10-CM

## 2023-02-23 DIAGNOSIS — S060X0A Concussion without loss of consciousness, initial encounter: Secondary | ICD-10-CM

## 2023-02-23 DIAGNOSIS — M542 Cervicalgia: Secondary | ICD-10-CM | POA: Diagnosis not present

## 2023-02-23 DIAGNOSIS — G44319 Acute post-traumatic headache, not intractable: Secondary | ICD-10-CM | POA: Diagnosis not present

## 2023-02-23 NOTE — Patient Instructions (Addendum)
Good to see you  Vestibular referral  Work note 2 hours of screen time a day. Recommend taking a 15 minute rest break every hour as needed  2 week follow up

## 2023-02-24 ENCOUNTER — Other Ambulatory Visit: Payer: Self-pay | Admitting: Family Medicine

## 2023-02-24 DIAGNOSIS — E0800 Diabetes mellitus due to underlying condition with hyperosmolarity without nonketotic hyperglycemic-hyperosmolar coma (NKHHC): Secondary | ICD-10-CM

## 2023-02-24 MED ORDER — TRULICITY 1.5 MG/0.5ML ~~LOC~~ SOAJ
1.5000 mg | SUBCUTANEOUS | 0 refills | Status: DC
Start: 2023-02-24 — End: 2023-02-28

## 2023-02-28 ENCOUNTER — Other Ambulatory Visit: Payer: Self-pay

## 2023-02-28 DIAGNOSIS — E0800 Diabetes mellitus due to underlying condition with hyperosmolarity without nonketotic hyperglycemic-hyperosmolar coma (NKHHC): Secondary | ICD-10-CM

## 2023-02-28 MED ORDER — TRULICITY 1.5 MG/0.5ML ~~LOC~~ SOAJ: 1.5000 mg | SUBCUTANEOUS | 0 refills | Status: DC
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
## 2023-03-13 ENCOUNTER — Ambulatory Visit: Payer: Commercial Managed Care - HMO | Admitting: Sports Medicine

## 2023-03-13 VITALS — BP 108/78 | HR 74 | Ht 66.0 in | Wt 193.0 lb

## 2023-03-13 DIAGNOSIS — G44319 Acute post-traumatic headache, not intractable: Secondary | ICD-10-CM | POA: Diagnosis not present

## 2023-03-13 DIAGNOSIS — R270 Ataxia, unspecified: Secondary | ICD-10-CM

## 2023-03-13 DIAGNOSIS — S060X0A Concussion without loss of consciousness, initial encounter: Secondary | ICD-10-CM | POA: Diagnosis not present

## 2023-03-13 DIAGNOSIS — M542 Cervicalgia: Secondary | ICD-10-CM | POA: Diagnosis not present

## 2023-03-13 NOTE — Patient Instructions (Addendum)
Good to see you  Work note provided  3-4 week follow up

## 2023-03-16 ENCOUNTER — Other Ambulatory Visit: Payer: Self-pay | Admitting: Sports Medicine

## 2023-03-18 ENCOUNTER — Emergency Department (HOSPITAL_COMMUNITY): Payer: Commercial Managed Care - HMO

## 2023-03-18 ENCOUNTER — Encounter (HOSPITAL_COMMUNITY): Payer: Self-pay | Admitting: Emergency Medicine

## 2023-03-18 ENCOUNTER — Other Ambulatory Visit: Payer: Self-pay

## 2023-03-18 ENCOUNTER — Emergency Department (HOSPITAL_COMMUNITY)
Admission: EM | Admit: 2023-03-18 | Discharge: 2023-03-19 | Disposition: A | Discharge status: Home / Self Care | Payer: Commercial Managed Care - HMO | Attending: Emergency Medicine | Admitting: Emergency Medicine

## 2023-03-18 DIAGNOSIS — R531 Weakness: Secondary | ICD-10-CM | POA: Diagnosis not present

## 2023-03-18 DIAGNOSIS — R519 Headache, unspecified: Secondary | ICD-10-CM | POA: Insufficient documentation

## 2023-03-18 DIAGNOSIS — E119 Type 2 diabetes mellitus without complications: Secondary | ICD-10-CM | POA: Diagnosis not present

## 2023-03-18 DIAGNOSIS — Z7984 Long term (current) use of oral hypoglycemic drugs: Secondary | ICD-10-CM | POA: Insufficient documentation

## 2023-03-18 DIAGNOSIS — I10 Essential (primary) hypertension: Secondary | ICD-10-CM | POA: Diagnosis not present

## 2023-03-18 DIAGNOSIS — R55 Syncope and collapse: Secondary | ICD-10-CM | POA: Diagnosis not present

## 2023-03-18 DIAGNOSIS — Z79899 Other long term (current) drug therapy: Secondary | ICD-10-CM | POA: Insufficient documentation

## 2023-03-18 DIAGNOSIS — S060XAS Concussion with loss of consciousness status unknown, sequela: Secondary | ICD-10-CM

## 2023-03-18 DIAGNOSIS — R42 Dizziness and giddiness: Secondary | ICD-10-CM

## 2023-03-18 LAB — CBC
HCT: 34.1 % — ABNORMAL LOW (ref 36.0–46.0)
Hemoglobin: 11.5 g/dL — ABNORMAL LOW (ref 12.0–15.0)
MCH: 29.2 pg (ref 26.0–34.0)
MCHC: 33.7 g/dL (ref 30.0–36.0)
MCV: 86.5 fL (ref 80.0–100.0)
Platelets: 265 10*3/uL (ref 150–400)
RBC: 3.94 MIL/uL (ref 3.87–5.11)
RDW: 12.8 % (ref 11.5–15.5)
WBC: 4.8 10*3/uL (ref 4.0–10.5)
nRBC: 0 % (ref 0.0–0.2)

## 2023-03-18 LAB — URINALYSIS, ROUTINE W REFLEX MICROSCOPIC
Bacteria, UA: NONE SEEN
Bilirubin Urine: NEGATIVE
Glucose, UA: 50 mg/dL — AB
Ketones, ur: NEGATIVE mg/dL
Leukocytes,Ua: NEGATIVE
Nitrite: NEGATIVE
Protein, ur: 30 mg/dL — AB
Specific Gravity, Urine: 1.015 (ref 1.005–1.030)
pH: 6 (ref 5.0–8.0)

## 2023-03-18 LAB — TROPONIN I (HIGH SENSITIVITY)
Troponin I (High Sensitivity): 5 ng/L (ref <18)
Troponin I (High Sensitivity): 4 ng/L (ref <18)

## 2023-03-18 LAB — D-DIMER, QUANTITATIVE: D-Dimer, Quant: 0.32 ug/mL-FEU (ref 0.00–0.50)

## 2023-03-18 LAB — BASIC METABOLIC PANEL
Anion gap: 8 (ref 5–15)
BUN: 29 mg/dL — ABNORMAL HIGH (ref 6–20)
CO2: 25 mmol/L (ref 22–32)
Calcium: 9 mg/dL (ref 8.9–10.3)
Chloride: 103 mmol/L (ref 98–111)
Creatinine, Ser: 1.32 mg/dL — ABNORMAL HIGH (ref 0.44–1.00)
GFR, Estimated: 48 mL/min — ABNORMAL LOW (ref >60)
Glucose, Bld: 196 mg/dL — ABNORMAL HIGH (ref 70–99)
Potassium: 3.4 mmol/L — ABNORMAL LOW (ref 3.5–5.1)
Sodium: 136 mmol/L (ref 135–145)

## 2023-03-18 LAB — CBG MONITORING, ED
Glucose-Capillary: 185 mg/dL — ABNORMAL HIGH (ref 70–99)
Glucose-Capillary: 90 mg/dL (ref 70–99)

## 2023-03-18 MED ORDER — LACTATED RINGERS IV BOLUS
1000.0000 mL | Freq: Once | INTRAVENOUS | Status: AC
  Administered 2023-03-18: 1000 mL via INTRAVENOUS

## 2023-03-18 MED ORDER — KETOROLAC TROMETHAMINE 15 MG/ML IJ SOLN
15.0000 mg | Freq: Once | INTRAMUSCULAR | Status: AC
  Administered 2023-03-18: 15 mg via INTRAVENOUS
  Filled 2023-03-18: qty 1

## 2023-03-18 MED ORDER — ACETAMINOPHEN 500 MG PO TABS
1000.0000 mg | ORAL_TABLET | Freq: Once | ORAL | Status: AC
  Administered 2023-03-18: 1000 mg via ORAL
  Filled 2023-03-18: qty 2

## 2023-03-18 MED ORDER — MECLIZINE HCL 12.5 MG PO TABS
12.5000 mg | ORAL_TABLET | Freq: Once | ORAL | Status: AC
  Administered 2023-03-18: 12.5 mg via ORAL
  Filled 2023-03-18: qty 1

## 2023-03-18 MED ORDER — LORAZEPAM 2 MG/ML IJ SOLN
1.0000 mg | Freq: Once | INTRAMUSCULAR | Status: AC
  Administered 2023-03-18: 1 mg via INTRAVENOUS
  Filled 2023-03-18: qty 1

## 2023-03-18 NOTE — ED Provider Notes (Signed)
[place] EMERGENCY DEPARTMENT AT [facility] Provider Note   CSN: [id number] Arrival date & time: 03/18/23  1254     History Chief Complaint  Patient presents with   Weakness    Davielle A Bulley is a 52 y.o. female with h/o concussion, HTN, and diabetes presents to the ER for evaluation of near syncopal event. The patient reports that she was standing in a store to pay a bill when she started to feel lightheaded. She reports that she felt weak in both of her knees and told her daughter to take her to the ER. The patient reports that she has had a left sided "mild, nagging" headache during this time as well. Gradual onset, and has gotten better since being here. She denies any chest pain, palpitations, or SOB. Reports that she felt like she needed to take a deep breath once, but then felt better. She denies any room spinning sensation and reports that she felt lightheaded.  She denies any diaphoresis, nausea, vomiting, abdominal pain, chest pain, unilateral weakness.  She reports that her vision is at her baseline without any change.  She did recently sustain a concussion on the eighth which she is following up with concussion provider about.  She reports compliancy to her medications.  She is not on any blood thinners.  She reports that she did have half of a mixed alcoholic drink yesterday.  Reports occasional marijuana use but denies any other illicit drug use.   Weakness Associated symptoms: headaches   Associated symptoms: no abdominal pain, no chest pain, no diarrhea, no dizziness, no fever, no nausea, no shortness of breath and no vomiting        Home Medications Prior to Admission medications   Medication Sig Start Date End Date Taking? Authorizing Provider  ALPRAZolam Prudy Feeler) 0.5 MG tablet Take 1 tablet (0.5 mg total) by mouth at bedtime. 01/20/23   Del Nigel Berthold, FNP  amLODipine (NORVASC) 10 MG tablet Take 1 tablet (10 mg total) by mouth daily. 01/12/23   Del  Newman Nip, Tenna Child, FNP  atorvastatin (LIPITOR) 40 MG tablet TAKE 1 TABLET BY MOUTH EVERY DAY IN THE EVENING 12/22/21   Pricilla Riffle, MD  cloNIDine HCl (KAPVAY) 0.1 MG TB12 ER tablet Take 1/2 Tablet Daily at night 01/07/22   Pricilla Riffle, MD  dorzolamide (TRUSOPT) 2 % ophthalmic solution 1 drop 2 (two) times daily. 08/17/22   [provider]  Dulaglutide (TRULICITY) 1.5 MG/0.5ML SOPN Inject 1.5 mg into the skin once a week. 02/28/23   Del Orbe Polanco, Tenna Child, FNP  FARXIGA 10 MG TABS tablet Take 1 tablet (10 mg total) by mouth every morning. 01/04/23   Del Nigel Berthold, FNP  glipiZIDE (GLUCOTROL) 10 MG tablet Take 10 mg by mouth 2 (two) times daily. 07/29/22   [provider]  HYDROcodone-acetaminophen (NORCO/VICODIN) 5-325 MG tablet Take 1 tablet by mouth every 4 (four) hours as needed. 02/08/23   Jacalyn Lefevre, MD  ibuprofen (ADVIL) 600 MG tablet Take 1 tablet (600 mg total) by mouth every 6 (six) hours as needed. 02/08/23   Jacalyn Lefevre, MD  losartan-hydrochlorothiazide (HYZAAR) 100-25 MG tablet Take 1 tablet by mouth daily.    [provider]  meloxicam (MOBIC) 15 MG tablet Take 15 mg by mouth daily as needed for pain.    [provider]  meloxicam (MOBIC) 15 MG tablet Take 1 tablet (15 mg total) by mouth daily. 02/16/23   Richardean Sale, DO  metFORMIN (  GLUCOPHAGE-XR) 500 MG 24 hr tablet Take 1,000 mg by mouth in the morning and at bedtime. 10/30/20   [provider]  ofloxacin (OCUFLOX) 0.3 % ophthalmic solution Place 1 drop into both eyes every 4 (four) hours.    [provider]  pantoprazole (PROTONIX) 40 MG tablet Take 1 tablet (40 mg total) by mouth daily. 09/15/21   Arty Baumgartner, NP  timolol (TIMOPTIC) 0.5 % ophthalmic solution 1 drop 2 (two) times daily. 08/17/22   [provider]  Vitamin D, Ergocalciferol, (DRISDOL) 1.25 MG (50000 UNIT) CAPS capsule Take 50,000 Units by mouth once a week. 08/30/22   [provider]      Allergies    Lisinopril and Sulfa antibiotics    Review of Systems   Review of Systems  Constitutional:  Negative for chills and fever.  HENT:  Negative for congestion and rhinorrhea.   Eyes:  Positive for visual disturbance (At baseline).  Respiratory:  Negative for shortness of breath.   Cardiovascular:  Negative for chest pain and palpitations.  Gastrointestinal:  Negative for abdominal pain, constipation, diarrhea, nausea and vomiting.  Neurological:  Positive for weakness, light-headedness and headaches. Negative for dizziness, syncope and facial asymmetry.    Physical Exam Updated Vital Signs BP (!) 161/96 (BP Location: Right Arm)   Pulse 71   Temp 98.9 F (37.2 C)   Resp 18   Ht 5\' 6"  (1.676 m)   Wt 87.5 kg   LMP 09/19/2016 (Approximate)   SpO2 100%   BMI 31.15 kg/m  Physical Exam Vitals and nursing note reviewed.  Constitutional:      General: She is not in acute distress.    Appearance: Normal appearance. She is not ill-appearing or toxic-appearing.  HENT:     Head: Normocephalic and atraumatic.  Eyes:     General: No scleral icterus.    Extraocular Movements: Extraocular movements intact.     Pupils: Pupils are equal, round, and reactive to light.  Cardiovascular:     Rate and Rhythm: Normal rate and regular rhythm.     Pulses:          Radial pulses are 2+ on the right side and 2+ on the left side.       Dorsalis pedis pulses are 2+ on the right side.       Posterior tibial pulses are 2+ on the right side and 2+ on the left side.  Pulmonary:     Effort: Pulmonary effort is normal.     Breath sounds: Normal breath sounds.  Abdominal:     General: Abdomen is flat. Bowel sounds are normal.     Palpations: Abdomen is soft.  Musculoskeletal:        General: No deformity.     Cervical back: Normal range of motion.     Right lower leg: No edema.     Left lower leg: No edema.  Skin:    General: Skin is warm and dry.     Findings: No  lesion or rash.  Neurological:     General: No focal deficit present.     Mental Status: She is alert. Mental status is at baseline.     GCS: GCS eye subscore is 4. GCS verbal subscore is 5. GCS motor subscore is 6.     Cranial Nerves: No cranial nerve deficit, dysarthria or facial asymmetry.     Sensory: No sensory deficit.     Motor: No weakness or pronator drift.  Coordination: Finger-Nose-Finger Test normal.     ED Results / Procedures / Treatments   Labs (all labs ordered are listed, but only abnormal results are displayed) Labs Reviewed  BASIC METABOLIC PANEL - Abnormal; Notable for the following components:      Result Value   Potassium 3.4 (*)    Glucose, Bld 196 (*)    BUN 29 (*)    Creatinine, Ser 1.32 (*)    GFR, Estimated 48 (*)    All other components within normal limits  CBC - Abnormal; Notable for the following components:   Hemoglobin 11.5 (*)    HCT 34.1 (*)    All other components within normal limits  CBG MONITORING, ED - Abnormal; Notable for the following components:   Glucose-Capillary 185 (*)    All other components within normal limits  URINALYSIS, ROUTINE W REFLEX MICROSCOPIC  D-DIMER, QUANTITATIVE  TROPONIN I (HIGH SENSITIVITY)    EKG EKG Interpretation  Date/Time:  Saturday March 18 2023 13:19:32 EDT Ventricular Rate:  71 PR Interval:  269 QRS Duration: 90 QT Interval:  404 QTC Calculation: 439 R Axis:   81 Text Interpretation: Sinus rhythm Prolonged PR interval No significant change since last tracing Confirmed by Jacalyn Lefevre [phone]) on 03/18/2023 2:29:32 PM  Radiology CT Head Wo Contrast  Result Date: 03/18/2023 CLINICAL DATA:  Syncope/presyncope EXAM: CT HEAD WITHOUT CONTRAST TECHNIQUE: Contiguous axial images were obtained from the base of the skull through the vertex without intravenous contrast. RADIATION DOSE REDUCTION: This exam was performed according to the departmental dose-optimization program which includes automated  exposure control, adjustment of the mA and/or kV according to patient size and/or use of iterative reconstruction technique. COMPARISON:  02/08/2023 FINDINGS: Brain: No acute intracranial findings are seen. There are no signs of bleeding within the cranium. Ventricles are not dilated. There is no focal edema or mass effect. Vascular: Unremarkable. Skull: No acute findings are seen. Sinuses/Orbits: Unremarkable. Other: No significant interval changes are noted. IMPRESSION: No acute intracranial findings are seen in noncontrast CT brain. Electronically Signed   By: Ernie Avena M.D.   On: 03/18/2023 15:02    Procedures Procedures   Medications Ordered in ED Medications  lactated ringers bolus 1,000 mL (1,000 mLs Intravenous New Bag/Given 03/18/23 1403)    ED Course/ Medical Decision Making/ A&P   {                         Medical Decision Making Amount and/or Complexity of Data Reviewed Labs: ordered. Radiology: ordered.  Risk OTC drugs. Prescription drug management.   52 y.o. female presents to the ER for evaluation of near syncopal event today. Differential diagnosis includes but is not limited to BPPV, vestibular migraine, head trauma, AVM, intracranial tumor, multiple sclerosis, drug-related, CVA, vasovagal syncope, orthostatic hypotension, sepsis, hypoglycemia, electrolyte disturbance, anemia, anxiety/panic attack. Vital signs unremarkable. Physical exam as noted above.   I independently reviewed and interpreted the patient's labs.  Urinalysis shows hazy urine with some glucose.  This is normal hemoglobin and 30 protein otherwise no signs of infection.  CBC does show some anemia although does appear typical for the patient.  No cytosis.  CBG at 185 but patient known type II diabetic.  BMP shows mildly decreased potassium 3.4.  BUN is elevated 29 although patient does not have any abdominal pain.  Creatinine 1.32 which appears around her baseline.  D-dimer negative.  Troponin at  5.  CT imaging shows no acute intracranial findings  are seen in noncontrast CT brain per radiology read.   EKG reviewed and interpreted by my attending and read as Sinus rhythm Prolonged PR interval No significant change since last tracing.  On re-evaluation, the patient reports that she is feeling better and wanted to know if her symptoms were related to her concussion. I discussed with her that she could be feeling lightheaded with a headache from her previous concussion, especially if she continues to get headaches.   The patient reports that her knees still feel weak whenever she tries to walk and she feels "wobbly". She was able to walk out of the restroom without assistance. When her family member came to see her, she began to have trouble with her balance, but did not fall. Strength and neurological exam evaluated again. She has no focal deficit. She has intact and symmetric dorsi and plantarflexion.  Strength is intact and symmetric in the lower legs as well.  She is able to lift her legs out off with the bed without any drift.  She denies any neuropathy and reports her sensations intact well. She denies any diabetic neuropathy to her feet.   She reports that these symptoms are new, however in the [employer] [facility] note from 03/13/23, ataxia is mentioned as one of her problems.   When her family member was sitting in the room, the patient began shaking her right hand and both of her lower legs. She was alert and talking to myself and staff during this. She reports that this has never happened to her before. Surprisingly, she was able to lift herself up more in bed with the right arm that was shaking by the handrails. When her family member left, the patient stopped shaking and slept.   I woke the patient up to do another strength exam in the lower extremities and it was unchanged. Unsure if this is neurogenic or psychogenic in nature. Will transfer over to [facility] for MRI. Dr. Adela Lank accepts.  Patient will arrive via [facility].    Portions of this report may have been transcribed using voice recognition software. Every effort was made to ensure accuracy; however, inadvertent computerized transcription errors may be present.   Final Clinical Impression(s) / ED Diagnoses Final diagnoses:  None    Rx / DC Orders ED Discharge Orders     None         Achille Rich, Cordelia Poche 03/19/23 0129    Jacalyn Lefevre, MD 03/19/23 [phone]

## 2023-03-18 NOTE — ED Notes (Signed)
Patient transported to MRI 

## 2023-03-18 NOTE — ED Triage Notes (Signed)
Pt via POV c/o weakness, dizziness, headache x 30 mins. She feels unsteady on her feet. Hx diabetic retinopathy with baseline visual changes.

## 2023-03-18 NOTE — ED Notes (Signed)
Pt ambulated in room independently without any walker or cane. Pt c/o of bilateral knee "unusual feeling" and nurse asked if she felt like her knees where catching during ambulation. Pt said ,Yes, I have felt something like this before.

## 2023-03-18 NOTE — ED Notes (Signed)
Pt c/o feeling dizzy  and just not feeling right. Vital signs taken and WNL. Pt verbalized " why is my arm shaking?" , " Why is both my legs shaking?'. Nurse spoke with pt and then informed PA. Nurse came back to the room to check on pt again. Pt continues to shake her right arm and bilateral feet move. Pt asked if she was going to be sent to [facility] for A MRI? PA walked into room to assess pt and requested nurses to get a CBG.  Pt then with her right arm shaking grabbed the railing to the bed and pulled herself up into the bed and placed herself better in bed.

## 2023-03-18 NOTE — ED Notes (Signed)
EKG & CBG completed during triage

## 2023-03-18 NOTE — ED Triage Notes (Signed)
Pt bib [facility] from [place] to receive MRI

## 2023-03-19 NOTE — ED Notes (Signed)
Patient ambulated without assistance.

## 2023-03-19 NOTE — Care Management (Signed)
RNCM contacted [employer] rolling walker ordered and will be delivered to patient at bedside prior to discharge from ED Updated ED RN. No further ED RN Care Management needs identified.

## 2023-03-19 NOTE — Discharge Instructions (Addendum)
It was a pleasure taking care of you today.  You were seen in the emergency department initially for having a dizziness episode and having headaches, this may related to your recent concussion.  You are having some abnormal muscle movements as well today so you received an MRI which did not show any acute abnormalities.  Specifically regarding your balance problems there is no sign of a stroke.   Ativan seemed to resolve your symptoms.  It is very important for you to follow-up with neurology.  At this time there is no indication for admission.  Call your neurologist on Monday for follow-up.  Come back to the ER if you have any new or worsening symptoms.

## 2023-03-19 NOTE — ED Provider Notes (Signed)
  Physical Exam  BP 133/65   Pulse 65   Temp 98.5 F (36.9 C) (Oral)   Resp 14   Ht 5\' 6"  (1.676 m)   Wt 87.5 kg   LMP 09/19/2016 (Approximate)   SpO2 100%   BMI 31.15 kg/m   Physical Exam Constitutional:      Appearance: Normal appearance.  Pulmonary:     Effort: Pulmonary effort is normal.  Neurological:     Mental Status: She is alert.     Motor: No weakness.     Gait: Gait abnormal.     Procedures  Procedures  ED Course / MDM    Medical Decision Making Amount and/or Complexity of Data Reviewed Labs: ordered. Radiology: ordered.  Risk OTC drugs. Prescription drug management.   Patient was seen in the [facility] ED and transferred here for MRI for having some balance problems and extremity shaking in her right arm primarily.  She had initially presented for presyncopal episode but then began complaining of some balance issues and some shaking when family had arrived.  Her workup had been very reassuring but due to this they wanted to get MRI to rule out any organic cause.  Did not have any focal weakness numbness or tingling on their exams.  And arrived here in the ED.  I evaluated her at bedside, she was then having jerking to the left arm but was able to control the arm when I asked her to move her extremities, did finger-to-nose and strength testing.  She states the jerking had been in the right side before and now seems to be switching sides.  MRI was ordered.  She is given Ativan so she can remain still for the MRI.  She tolerated the MRI well which showed no acute process.  I allowed her to sleep for a while after MRI, she is resting comfortably with no noted abnormal movements.  When I woke her up she states she feels much better, symptoms have resolved.  She still has normal strength.  She does note that she has been seeing neurology and has had problems with her balance in the past.  Discussed with her given her reassuring workup today we will plan to have  her follow-up closely with her neurologist and she was given strict return precautions.  Pending her being able to ambulate we will discharge her home to follow-up with neurology.  0300-watch patient ambulate with the nurse.  She is able to ambulate independently but does have some ataxia.  This has been ongoing since her head injury.  Given no stroke on MRI this has a broad differential but given is been ongoing problem with think outpatient follow-up is reasonable.  Patient is agreeable with this.  I have placed an [facility] referral for neurology follow-up.  Discussed with her to avoid driving given her dizziness and balance problems until she is seen by neurology and she is agreeable with this as well.  Will provide patient with walker for home so she can ambulate safely and avoid falls. TOC consulted to provide this       Josem Kaufmann 03/19/23 0318    Sloan Leiter, DO 03/19/23 [phone]

## 2023-03-20 ENCOUNTER — Ambulatory Visit (INDEPENDENT_AMBULATORY_CARE_PROVIDER_SITE_OTHER): Payer: Commercial Managed Care - HMO | Admitting: Family Medicine

## 2023-03-20 ENCOUNTER — Telehealth: Payer: Self-pay | Admitting: Sports Medicine

## 2023-03-20 ENCOUNTER — Encounter: Payer: Self-pay | Admitting: Family Medicine

## 2023-03-20 VITALS — BP 136/78 | HR 75 | Resp 16 | Ht 66.0 in | Wt 195.0 lb

## 2023-03-20 DIAGNOSIS — R29898 Other symptoms and signs involving the musculoskeletal system: Secondary | ICD-10-CM

## 2023-03-20 NOTE — Assessment & Plan Note (Addendum)
Positive lumbar tenderness noted in the office with ataxic gait No recent fall injury noted She reports increased shaking of her right lower extremity when ambulating No systematic symptoms reported No numbness or tingling reported Referral placed to neurology for further evaluation of the patient's symptoms Encouraged to continue current treatment regimen for posttraumatic headache

## 2023-03-20 NOTE — Telephone Encounter (Signed)
Pt called, was advised to advise [place] if symptoms change/worsen before July recheck.  Pt had various issues this weekend and was seen at [facility] ED 03/18/2023.  Unsure if recheck needs to be moved up based on this visit.
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
## 2023-03-20 NOTE — Telephone Encounter (Signed)
Scheduled for 03/22/2023. Seeing Primary Care today.

## 2023-03-20 NOTE — Patient Instructions (Addendum)
I appreciate the opportunity to provide care to you today!    Follow up:  04/14/2023  Kindly review the attached clinical reference to understand your risk for falls and to prevent falls  Referrals today- neurology   Please continue to a heart-healthy diet and increase your physical activities. Try to exercise for at least five days a week.      It was a pleasure to see you and I look forward to continuing to work together on your health and well-being. Please do not hesitate to call the office if you need care or have questions about your care.   Have a wonderful day and week. With Gratitude, Gilmore Laroche MSN, FNP-BC
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
## 2023-03-22 ENCOUNTER — Ambulatory Visit: Payer: Commercial Managed Care - HMO | Admitting: Sports Medicine

## 2023-03-22 VITALS — BP 136/78 | HR 81 | Ht 66.0 in | Wt 188.0 lb

## 2023-03-22 DIAGNOSIS — R270 Ataxia, unspecified: Secondary | ICD-10-CM | POA: Diagnosis not present

## 2023-03-22 DIAGNOSIS — G44319 Acute post-traumatic headache, not intractable: Secondary | ICD-10-CM

## 2023-03-22 DIAGNOSIS — M542 Cervicalgia: Secondary | ICD-10-CM

## 2023-03-22 DIAGNOSIS — S060X0A Concussion without loss of consciousness, initial encounter: Secondary | ICD-10-CM

## 2023-03-22 NOTE — Patient Instructions (Signed)
If [facility] refuses to see you call our clinic and we will make a different referral. Start vestibular therapy Call our clinic and follow up after you see neurology

## 2023-03-23 ENCOUNTER — Encounter: Payer: Self-pay | Admitting: Neurology

## 2023-03-27 ENCOUNTER — Ambulatory Visit: Payer: Commercial Managed Care - HMO | Admitting: Internal Medicine
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
## 2023-03-31 ENCOUNTER — Other Ambulatory Visit: Payer: Self-pay | Admitting: Family Medicine

## 2023-03-31 DIAGNOSIS — F5101 Primary insomnia: Secondary | ICD-10-CM

## 2023-04-03 ENCOUNTER — Ambulatory Visit: Payer: Commercial Managed Care - HMO | Admitting: Sports Medicine

## 2023-04-03 VITALS — BP 138/84 | HR 66 | Ht 66.0 in | Wt 188.0 lb

## 2023-04-03 DIAGNOSIS — R270 Ataxia, unspecified: Secondary | ICD-10-CM

## 2023-04-03 DIAGNOSIS — T7840XA Allergy, unspecified, initial encounter: Secondary | ICD-10-CM

## 2023-04-03 DIAGNOSIS — S060X0A Concussion without loss of consciousness, initial encounter: Secondary | ICD-10-CM

## 2023-04-03 DIAGNOSIS — G44319 Acute post-traumatic headache, not intractable: Secondary | ICD-10-CM | POA: Diagnosis not present

## 2023-04-03 DIAGNOSIS — M542 Cervicalgia: Secondary | ICD-10-CM

## 2023-04-03 DIAGNOSIS — H539 Unspecified visual disturbance: Secondary | ICD-10-CM

## 2023-04-03 MED ORDER — AMITRIPTYLINE HCL 10 MG PO TABS
10.0000 mg | ORAL_TABLET | Freq: Every day | ORAL | 0 refills | Status: DC
Start: 2023-04-03 — End: 2023-04-24

## 2023-04-03 NOTE — Patient Instructions (Addendum)
Amitriptyline 10 mg daily for  1 week and then increase to 20 mg daily   3 week follow up

## 2023-04-14 ENCOUNTER — Ambulatory Visit (INDEPENDENT_AMBULATORY_CARE_PROVIDER_SITE_OTHER): Payer: Commercial Managed Care - HMO | Admitting: Family Medicine

## 2023-04-14 ENCOUNTER — Encounter: Payer: Self-pay | Admitting: Family Medicine

## 2023-04-14 VITALS — BP 130/80 | HR 87 | Ht 66.0 in | Wt 182.0 lb

## 2023-04-14 DIAGNOSIS — E1122 Type 2 diabetes mellitus with diabetic chronic kidney disease: Secondary | ICD-10-CM | POA: Diagnosis not present

## 2023-04-14 DIAGNOSIS — Z202 Contact with and (suspected) exposure to infections with a predominantly sexual mode of transmission: Secondary | ICD-10-CM

## 2023-04-14 DIAGNOSIS — E559 Vitamin D deficiency, unspecified: Secondary | ICD-10-CM

## 2023-04-14 DIAGNOSIS — I10 Essential (primary) hypertension: Secondary | ICD-10-CM | POA: Diagnosis not present

## 2023-04-14 DIAGNOSIS — E782 Mixed hyperlipidemia: Secondary | ICD-10-CM

## 2023-04-14 DIAGNOSIS — N1831 Chronic kidney disease, stage 3a: Secondary | ICD-10-CM

## 2023-04-14 NOTE — Assessment & Plan Note (Addendum)
Last Hemoglobin A1C 7.8, Labs ordered today awaiting results will follow up. Patient reportingTrulicity 1.5 mg injection once a week, Farxiga 10 mg daily,  Glipizide 10 mg twice daily. Discussed medication desired effects, potential side effects, and how to administer the medication. Nonpharmacological interventions such as low carb diet,high in protein, vegetables and fruit discussed. Educated on importance of physical activity 150 minutes per week. Discussed signs and symptoms of hypoglycemia, & hyperglycemia and need to present to the ED if symptoms occurs.Follow up in 3 months or sooner if needed. Patient verbalizes understanding regarding plan of care and all questions answered. Ophthalmology visit current, Foot exam within desired limits

## 2023-04-14 NOTE — Progress Notes (Signed)
Patient Office Visit   Subjective   Patient ID: [name], female    DOB: [birth year]  Age: 52 y.o. MRN: [id number]  CC:  Chief Complaint  Patient presents with   Hypertension   Diabetes    HPI Annmarie A Piascik 52 year old female, presents to the clinic for type 2 diabetes and HTN management  She  has a past medical history of Diabetic neuropathy (HCC), DM (diabetes mellitus), type 2, uncontrolled, Epiretinal membrane, right, GERD (gastroesophageal reflux disease), HTN (hypertension), Hyperlipidemia, Macular edema, diabetic (HCC), Rheumatoid arthritis (HCC), and Vitamin D deficiency.  Diabetes She presents for her follow-up diabetic visit. She has type 2 diabetes mellitus. Her disease course has been improving. Hypoglycemia symptoms include tremors. Pertinent negatives for hypoglycemia include no confusion or dizziness. Pertinent negatives for diabetes include no polydipsia, no polyphagia, no polyuria and no visual change. Pertinent negatives for hypoglycemia complications include no blackouts. Symptoms are improving. Risk factors for coronary artery disease include diabetes mellitus and hypertension. Current diabetic treatment includes diet and oral agent (triple therapy). She is compliant with treatment most of the time. Her weight is fluctuating minimally. She is following a generally healthy diet. She has not had a previous visit with a dietitian. She rarely participates in exercise. Her breakfast blood glucose range is generally 90-110 mg/dl. Her bedtime blood glucose range is generally 140-180 mg/dl. An ACE inhibitor/angiotensin II receptor blocker is being taken. She does not see a podiatrist.Eye exam is current.  Hypertension This is a chronic problem. The problem has been gradually improving since onset. The problem is controlled. Pertinent negatives include no palpitations, peripheral edema or shortness of breath. Risk factors for coronary artery disease include diabetes mellitus,  family history and sedentary lifestyle. Past treatments include angiotensin blockers, diuretics and calcium channel blockers. The current treatment provides moderate improvement. Compliance problems include exercise.  Hypertensive end-organ damage includes kidney disease.      Outpatient Encounter Medications as of 04/14/2023  Medication Sig   ALPRAZolam (XANAX) 0.5 MG tablet TAKE 1 TABLET BY MOUTH AT BEDTIME.   amitriptyline (ELAVIL) 10 MG tablet Take 1 tablet (10 mg total) by mouth at bedtime.   amLODipine (NORVASC) 10 MG tablet Take 1 tablet (10 mg total) by mouth daily.   cloNIDine HCl (KAPVAY) 0.1 MG TB12 ER tablet Take 1/2 Tablet Daily at night   dorzolamide (TRUSOPT) 2 % ophthalmic solution 1 drop 2 (two) times daily.   Dulaglutide (TRULICITY) 1.5 MG/0.5ML SOPN Inject 1.5 mg into the skin once a week.   FARXIGA 10 MG TABS tablet Take 1 tablet (10 mg total) by mouth every morning.   furosemide (LASIX) 20 MG tablet Take 20 mg by mouth daily.   glipiZIDE (GLUCOTROL) 10 MG tablet Take 10 mg by mouth 2 (two) times daily.   HYDROcodone-acetaminophen (NORCO/VICODIN) 5-325 MG tablet Take 1 tablet by mouth every 4 (four) hours as needed.   latanoprost (XALATAN) 0.005 % ophthalmic solution Place 1 drop into the left eye daily.   losartan-hydrochlorothiazide (HYZAAR) 100-25 MG tablet Take 1 tablet by mouth daily.   metFORMIN (GLUCOPHAGE-XR) 500 MG 24 hr tablet Take 1,000 mg by mouth in the morning and at bedtime.   ofloxacin (OCUFLOX) 0.3 % ophthalmic solution Place 1 drop into both eyes every 4 (four) hours.   pantoprazole (PROTONIX) 40 MG tablet Take 1 tablet (40 mg total) by mouth daily.   timolol (TIMOPTIC) 0.5 % ophthalmic solution 1 drop 2 (two) times daily.   Vitamin D, Ergocalciferol, (DRISDOL)  1.25 MG (50000 UNIT) CAPS capsule Take 50,000 Units by mouth once a week.   [DISCONTINUED] atorvastatin (LIPITOR) 40 MG tablet TAKE 1 TABLET BY MOUTH EVERY DAY IN THE EVENING   No  facility-administered encounter medications on file as of 04/14/2023.    Past Surgical History:  Procedure Laterality Date   COLONOSCOPY N/A 01/04/2017   Procedure: COLONOSCOPY;  Surgeon: West Bali, MD;  [facility];  Service: Endoscopy;  Laterality: N/A;  10:30am   KENALOG INJECTION Right 12/03/2018   Procedure: LUCENTIS 0.3MG  INJECTION;  Surgeon: Stephannie Li, MD;  Location: [facility] OR;  Service: Ophthalmology;  Laterality: Right;   LEFT HEART CATH AND CORONARY ANGIOGRAPHY N/A 09/14/2021   Procedure: LEFT HEART CATH AND CORONARY ANGIOGRAPHY;  Surgeon: Lennette Bihari, MD;  [facility]: [facility];  Service: Cardiovascular;  Laterality: N/A;   MEMBRANE PEEL Right 12/03/2018   Procedure: MEMBRANE PEEL;  Surgeon: Stephannie Li, MD;  Location: [facility] OR;  Service: Ophthalmology;  Laterality: Right;   PARS PLANA VITRECTOMY Right 12/03/2018   Procedure: PARS PLANA VITRECTOMY WITH 25 GAUGE;  Surgeon: Stephannie Li, MD;  Location: [facility] OR;  Service: Ophthalmology;  Laterality: Right;   PHOTOCOAGULATION WITH LASER Right 12/03/2018   Procedure: PHOTOCOAGULATION WITH LASER;  Surgeon: Stephannie Li, MD;  Location: [facility] OR;  Service: Ophthalmology;  Laterality: Right;   TUBAL LIGATION      Review of Systems  Constitutional:  Negative for fever.  Respiratory:  Negative for shortness of breath.   Cardiovascular:  Negative for palpitations.  Neurological:  Positive for tremors. Negative for dizziness.  Endo/Heme/Allergies:  Negative for polydipsia and polyphagia.  Psychiatric/Behavioral:  Negative for confusion.       Objective    BP 130/80   Pulse 87   Ht 5\' 6"  (1.676 m)   Wt 182 lb (82.6 kg)   LMP 09/19/2016 (Approximate)   SpO2 98%   BMI 29.38 kg/m   Physical Exam Vitals reviewed.  Constitutional:      General: She is not in acute distress.    Appearance: Normal appearance. She is not ill-appearing, toxic-appearing or diaphoretic.  HENT:     Head: Normocephalic.  Eyes:      General:        Right eye: No discharge.        Left eye: No discharge.     Conjunctiva/sclera: Conjunctivae normal.  Cardiovascular:     Rate and Rhythm: Normal rate.     Pulses: Normal pulses.     Heart sounds: Normal heart sounds.  Pulmonary:     Effort: Pulmonary effort is normal. No respiratory distress.     Breath sounds: Normal breath sounds.  Musculoskeletal:        General: Normal range of motion.     Cervical back: Normal range of motion.  Skin:    General: Skin is warm and dry.     Capillary Refill: Capillary refill takes less than 2 seconds.  Neurological:     General: No focal deficit present.     Mental Status: She is alert and oriented to person, place, and time.     Coordination: Coordination normal.     Gait: Gait normal.  Psychiatric:        Mood and Affect: Mood normal.        Behavior: Behavior normal.       Assessment & Plan:  Type 2 diabetes mellitus with stage 3a chronic kidney disease, without long-term current use of insulin (HCC) Assessment &  Plan: Last Hemoglobin A1C 7.8, Labs ordered today awaiting results will follow up. Patient reporting taking Trulicity 1.5 mg injection once a week, Farxiga 10 mg daily,  Glipizide 10 mg twice daily. Discussed medication desired effects, potential side effects, and how to administer the medication. Nonpharmacological interventions such as low carb diet,high in protein, vegetables and fruit discussed. Educated on importance of physical activity 150 minutes per week. Discussed signs and symptoms of hypoglycemia, & hyperglycemia and need to present to the ED if symptoms occurs.Follow up in 3 months or sooner if needed. Patient verbalizes understanding regarding plan of care and all questions answered. Ophthalmology visit current, Foot exam within desired limits   Orders: -     Hemoglobin A1c  Mixed hyperlipidemia -     Lipid panel  Primary hypertension Assessment & Plan: Vitals:   04/14/23 0903 04/14/23 0930  BP:  136/78 130/80  Blood pressure controlled in today's visit, Labs ordered  Continue Amlodipine 10 mg,  Clonidine 0.1 mg half a tablet, Losartan-hydrochlorothiazide 100-25 mg Continued discussion on DASH diet, low sodium diet and maintain a exercise routine for 150 minutes per week.   Orders: -     Basic metabolic panel -     Microalbumin / creatinine urine ratio  Vitamin D deficiency -     VITAMIN D 25 Hydroxy (Vit-D Deficiency, Fractures)  Possible exposure to STI Assessment & Plan: Patient positive for Trichomonas on April 2024 Reported did not finish antibiotic course NuSwab Ordered today- awaiting results will follow up   Orders: -     NuSwab Vaginitis Plus (VG+)    Return in about 3 months (around 07/15/2023) for diabetes, chronic follow-up.   Cruzita Lederer Newman Nip, FNP

## 2023-04-14 NOTE — Assessment & Plan Note (Signed)
Vitals:   04/14/23 0903 04/14/23 0930  BP: 136/78 130/80  Blood pressure controlled in today's visit, Labs ordered  Continue Amlodipine 10 mg,  Clonidine 0.1 mg half a tablet, Losartan-hydrochlorothiazide 100-25 mg Continued discussion on DASH diet, low sodium diet and maintain a exercise routine for 150 minutes per week.

## 2023-04-14 NOTE — Assessment & Plan Note (Signed)
Patient positive for Trichomonas on April 2024 Reported did not finish antibiotic course NuSwab Ordered today- awaiting results will follow up

## 2023-04-14 NOTE — Patient Instructions (Signed)

## 2023-04-15 LAB — BASIC METABOLIC PANEL
BUN/Creatinine Ratio: 12 (ref 9–23)
BUN: 19 mg/dL (ref 6–24)
CO2: 25 mmol/L (ref 20–29)
Calcium: 9.9 mg/dL (ref 8.7–10.2)
Chloride: 102 mmol/L (ref 96–106)
Creatinine, Ser: 1.53 mg/dL — ABNORMAL HIGH (ref 0.57–1.00)
Glucose: 117 mg/dL — ABNORMAL HIGH (ref 70–99)
Potassium: 4.1 mmol/L (ref 3.5–5.2)
Sodium: 140 mmol/L (ref 134–144)
eGFR: 40 mL/min/{1.73_m2} — ABNORMAL LOW (ref >59)

## 2023-04-15 LAB — LIPID PANEL
Chol/HDL Ratio: 3 ratio (ref 0.0–4.4)
Cholesterol, Total: 204 mg/dL — ABNORMAL HIGH (ref 100–199)
HDL: 68 mg/dL (ref >39)
LDL Chol Calc (NIH): 115 mg/dL — ABNORMAL HIGH (ref 0–99)
Triglycerides: 118 mg/dL (ref 0–149)
VLDL Cholesterol Cal: 21 mg/dL (ref 5–40)

## 2023-04-15 LAB — VITAMIN D 25 HYDROXY (VIT D DEFICIENCY, FRACTURES): Vit D, 25-Hydroxy: 22.1 ng/mL — ABNORMAL LOW (ref 30.0–100.0)

## 2023-04-15 LAB — HEMOGLOBIN A1C
Est. average glucose Bld gHb Est-mCnc: 237 mg/dL
Hgb A1c MFr Bld: 9.9 % — ABNORMAL HIGH (ref 4.8–5.6)

## 2023-04-19 ENCOUNTER — Encounter: Payer: Self-pay | Admitting: Neurology

## 2023-04-19 ENCOUNTER — Ambulatory Visit: Payer: Commercial Managed Care - HMO | Admitting: Neurology

## 2023-04-19 ENCOUNTER — Encounter: Payer: Self-pay | Admitting: Family Medicine

## 2023-04-19 VITALS — BP 144/76 | HR 76 | Ht 66.0 in | Wt 184.6 lb

## 2023-04-19 DIAGNOSIS — G629 Polyneuropathy, unspecified: Secondary | ICD-10-CM

## 2023-04-19 DIAGNOSIS — R29898 Other symptoms and signs involving the musculoskeletal system: Secondary | ICD-10-CM | POA: Diagnosis not present

## 2023-04-19 DIAGNOSIS — R208 Other disturbances of skin sensation: Secondary | ICD-10-CM

## 2023-04-19 NOTE — Patient Instructions (Signed)
Good to meet you.  Have bloodwork done for TSH, B12, B1, ESR, CRP, SPEP, IFE, folate, ANA, SS-A, SS-B  Schedule EMG/NCV of both upper extremities and both lower extremities  3. Proceed with Physical therapy for balance. Continue amitriptyline for nerve sensitivity  4. Follow-up in 3 months, call for any changes
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
## 2023-04-20 ENCOUNTER — Other Ambulatory Visit: Payer: Self-pay | Admitting: Family Medicine

## 2023-04-20 ENCOUNTER — Ambulatory Visit: Payer: Commercial Managed Care - HMO | Admitting: Neurology

## 2023-04-20 DIAGNOSIS — G629 Polyneuropathy, unspecified: Secondary | ICD-10-CM

## 2023-04-20 DIAGNOSIS — R29898 Other symptoms and signs involving the musculoskeletal system: Secondary | ICD-10-CM

## 2023-04-20 LAB — NUSWAB VAGINITIS PLUS (VG+)
Candida albicans, NAA: NEGATIVE
Candida glabrata, NAA: NEGATIVE
Chlamydia trachomatis, NAA: NEGATIVE
Neisseria gonorrhoeae, NAA: NEGATIVE
Trich vag by NAA: NEGATIVE

## 2023-04-20 MED ORDER — TRULICITY 3 MG/0.5ML ~~LOC~~ SOAJ: 3.0000 mg | SUBCUTANEOUS | 0 refills | Status: DC

## 2023-04-20 MED ORDER — ROSUVASTATIN CALCIUM 20 MG PO TABS: 20.0000 mg | ORAL_TABLET | Freq: Every day | ORAL | 3 refills | Status: DC

## 2023-04-20 NOTE — Procedures (Signed)
[facility]  [address] [place], [address]  [place], [place] [address] Tel: [phone] Fax: [phone] Test Date:  04/20/2023  Patient: Alexis Landry DOB: December 05, 1970 Physician: Nita Sickle, DO  Sex: Female Height: 5\' 6"  Ref Phys: Patrcia Dolly, MD  ID#: [id number]   Technician:    History: This is a 52 year old female referred for evaluation of bilateral leg weakness and pain.  NCV & EMG Findings: Electrodiagnostic testing of the right lower extremity and additional studies of the left shows: Bilateral sural and superficial peroneal sensory responses are within normal limits. Bilateral peroneal and tibial motor responses are within normal limits. Bilateral tibial H reflex studies are within normal limits. There is no evidence of active or chronic motor axonal changes affecting any of the tested muscles.  Motor unit configuration and recruitment pattern is within normal limits.  Impression: This is a normal study of the lower extremities.  In particular, there is no evidence of a large fiber sensorimotor polyneuropathy or lumbosacral radiculopathy.    ___________________________ Nita Sickle, DO    Nerve Conduction Studies   Stim Site NR Peak (ms) Norm Peak (ms) O-P Amp (V) Norm O-P Amp  Left Sup Peroneal Anti Sensory (Ant Lat Mall)  32 C  12 cm    2.2 <4.6 5.7 >4  Right Sup Peroneal Anti Sensory (Ant Lat Mall)  32 C  12 cm    2.6 <4.6 6.9 >4  Left Sural Anti Sensory (Lat Mall)  32 C  Calf    3.6 <4.6 9.7 >4  Right Sural Anti Sensory (Lat Mall)  32 C  Calf    2.9 <4.6 8.2 >4     Stim Site NR Onset (ms) Norm Onset (ms) O-P Amp (mV) Norm O-P Amp Site1 Site2 Delta-0 (ms) Dist (cm) Vel (m/s) Norm Vel (m/s)  Left Peroneal Motor (Ext Dig Brev)  32 C  Ankle    5.5 <6.0 2.6 >2.5 B Fib Ankle 8.2 38.0 46 >40  B Fib    13.7  2.2  Poplt B Fib 1.9 9.0 47 >40  Poplt    15.6  2.0         Right Peroneal Motor (Ext Dig Brev)  32 C  Ankle    4.9 <6.0 2.9 >2.5 B  Fib Ankle 8.6 35.0 41 >40  B Fib    13.5  2.6  Poplt B Fib 1.6 9.0 56 >40  Poplt    15.1  2.6         Left Tibial Motor (Abd Hall Brev)  32 C  Ankle    5.4 <6.0 6.9 >4 Knee Ankle 9.9 41.0 41 >40  Knee    15.3  4.4         Right Tibial Motor (Abd Hall Brev)  32 C  Ankle    5.7 <6.0 7.4 >4 Knee Ankle 9.8 39.0 40 >40  Knee    15.5  3.7          Electromyography   Side Muscle Ins.Act Fibs Fasc Recrt Amp Dur Poly Activation Comment  Right AntTibialis Nml Nml Nml Nml Nml Nml Nml Nml N/A  Right Gastroc Nml Nml Nml Nml Nml Nml Nml Nml N/A  Right Flex Dig Long Nml Nml Nml Nml Nml Nml Nml Nml N/A  Right RectFemoris Nml Nml Nml Nml Nml Nml Nml Nml N/A  Right BicepsFemS Nml Nml Nml Nml Nml Nml Nml Nml N/A  Right GluteusMed Nml Nml Nml Nml Nml Nml Nml Nml N/A  Left AntTibialis Nml Nml Nml Nml Nml Nml Nml Nml N/A  Left Gastroc Nml Nml Nml Nml Nml Nml Nml Nml N/A  Left Flex Dig Long Nml Nml Nml Nml Nml Nml Nml Nml N/A  Left GluteusMed Nml Nml Nml Nml Nml Nml Nml Nml N/A  Left RectFemoris Nml Nml Nml Nml Nml Nml Nml Nml N/A  Left BicepsFemS Nml Nml Nml Nml Nml Nml Nml Nml N/A      Waveforms:

## 2023-04-21 LAB — PROTEIN ELECTROPHORESIS, SERUM: Albumin ELP: 4 g/dL (ref 3.8–4.8)

## 2023-04-21 LAB — ANA: Anti Nuclear Antibody (ANA): NEGATIVE

## 2023-04-21 LAB — IMMUNOFIXATION ELECTROPHORESIS
IgG (Immunoglobin G), Serum: 1321 mg/dL (ref 600–1640)
Immunoglobulin A: 253 mg/dL (ref 47–310)

## 2023-04-21 LAB — SJOGRENS SYNDROME-B EXTRACTABLE NUCLEAR ANTIBODY: SSB (La) (ENA) Antibody, IgG: <1 AI
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
## 2023-04-23 LAB — PROTEIN ELECTROPHORESIS, SERUM
Alpha 1: 0.3 g/dL (ref 0.2–0.3)
Beta 2: 0.5 g/dL (ref 0.2–0.5)
Total Protein: 7.2 g/dL (ref 6.1–8.1)

## 2023-04-23 LAB — SJOGRENS SYNDROME-A EXTRACTABLE NUCLEAR ANTIBODY: SSA (Ro) (ENA) Antibody, IgG: <1 AI

## 2023-04-24 ENCOUNTER — Ambulatory Visit: Payer: Commercial Managed Care - HMO | Admitting: Sports Medicine

## 2023-04-24 ENCOUNTER — Encounter: Payer: Self-pay | Admitting: Neurology

## 2023-04-24 VITALS — BP 130/82 | HR 71 | Ht 66.0 in | Wt 181.0 lb

## 2023-04-24 DIAGNOSIS — H539 Unspecified visual disturbance: Secondary | ICD-10-CM

## 2023-04-24 DIAGNOSIS — G44319 Acute post-traumatic headache, not intractable: Secondary | ICD-10-CM

## 2023-04-24 DIAGNOSIS — R270 Ataxia, unspecified: Secondary | ICD-10-CM | POA: Diagnosis not present

## 2023-04-24 DIAGNOSIS — S060X0A Concussion without loss of consciousness, initial encounter: Secondary | ICD-10-CM

## 2023-04-24 DIAGNOSIS — M542 Cervicalgia: Secondary | ICD-10-CM

## 2023-04-24 MED ORDER — AMITRIPTYLINE HCL 10 MG PO TABS: 10.0000 mg | ORAL_TABLET | Freq: Every day | ORAL | 0 refills | Status: DC

## 2023-04-24 NOTE — Patient Instructions (Signed)
Amitriptyline refill  4 week follow up

## 2023-04-25 LAB — TSH: TSH: 1.13 mIU/L

## 2023-04-25 LAB — IMMUNOFIXATION ELECTROPHORESIS
IgG (Immunoglobin G), Serum: 1321 mg/dL (ref 600–1640)
IgM, Serum: 231 mg/dL (ref 50–300)

## 2023-04-25 LAB — VITAMIN B1: Vitamin B1 (Thiamine): 9 nmol/L (ref 8–30)

## 2023-04-25 LAB — PROTEIN ELECTROPHORESIS, SERUM
Alpha 2: 0.9 g/dL (ref 0.5–0.9)
Beta Globulin: 0.4 g/dL (ref 0.4–0.6)
Gamma Globulin: 1.2 g/dL (ref 0.8–1.7)

## 2023-04-28 ENCOUNTER — Encounter: Payer: Commercial Managed Care - HMO | Admitting: Neurology

## 2023-05-01 ENCOUNTER — Other Ambulatory Visit: Payer: Self-pay | Admitting: Sports Medicine

## 2023-05-05 ENCOUNTER — Other Ambulatory Visit: Payer: Self-pay | Admitting: Family Medicine

## 2023-05-05 ENCOUNTER — Encounter: Payer: Self-pay | Admitting: Family Medicine

## 2023-05-05 ENCOUNTER — Other Ambulatory Visit: Payer: Self-pay

## 2023-05-05 DIAGNOSIS — F5101 Primary insomnia: Secondary | ICD-10-CM

## 2023-05-05 MED ORDER — TRULICITY 3 MG/0.5ML ~~LOC~~ SOAJ: 3.0000 mg | SUBCUTANEOUS | 0 refills | Status: DC

## 2023-05-05 MED ORDER — ALPRAZOLAM 0.5 MG PO TABS
0.5000 mg | ORAL_TABLET | Freq: Every day | ORAL | 0 refills | Status: DC
Start: 2023-05-05 — End: 2023-06-14

## 2023-05-10 ENCOUNTER — Telehealth: Payer: Self-pay

## 2023-05-10 NOTE — Telephone Encounter (Signed)
-----   Message from Van Clines sent at 05/10/2023  9:41 AM EDT ----- Pls let her know the bloodwork is normal. I don't see the results of B12, ESR, CRP, folate. Can you pls f/u with lab, they were drawn same day as resulted ones? Thanks

## 2023-05-10 NOTE — Telephone Encounter (Signed)
Pt called informed that lab work is normal its a couple of labs that we need to have drawn she will go by tomorrow to have them done tomorrow

## 2023-05-11 ENCOUNTER — Encounter: Payer: Commercial Managed Care - HMO | Admitting: Neurology
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
## 2023-05-22 ENCOUNTER — Encounter: Payer: Commercial Managed Care - HMO | Admitting: Sports Medicine

## 2023-05-26 ENCOUNTER — Other Ambulatory Visit: Payer: Commercial Managed Care - HMO

## 2023-05-26 ENCOUNTER — Ambulatory Visit (INDEPENDENT_AMBULATORY_CARE_PROVIDER_SITE_OTHER): Payer: Commercial Managed Care - HMO | Admitting: Neurology

## 2023-05-26 ENCOUNTER — Other Ambulatory Visit: Payer: Self-pay | Admitting: Neurology

## 2023-05-26 DIAGNOSIS — R29898 Other symptoms and signs involving the musculoskeletal system: Secondary | ICD-10-CM

## 2023-05-26 DIAGNOSIS — G5622 Lesion of ulnar nerve, left upper limb: Secondary | ICD-10-CM

## 2023-05-26 DIAGNOSIS — G5601 Carpal tunnel syndrome, right upper limb: Secondary | ICD-10-CM

## 2023-05-26 NOTE — Procedures (Signed)
[facility]  [address] [place], [address]  [place], [place] [address] Tel: [phone] Fax: [phone] Test Date:  05/26/2023  Patient: Alexis Landry DOB: Jan 11, 1971 Physician: Nita Sickle, DO  Sex: Female Height: 5\' 6"  Ref Phys: Nita Sickle, DO  ID#: [id number]   Technician:    History: This is a 52 year old female referred for evaluation of generalized weakness.  NCV & EMG Findings: Extensive electrodiagnostic testing of the right upper extremity and additional studies of the left shows:  Right median sensory response shows prolonged latency (R4.3 ms).  Left median, left mixed palmar, and bilateral ulnar sensory responses are within normal limits. Right median motor response shows prolonged latency (4.6 ms).  Left ulnar motor response shows prolonged onset latency (3.4 ms) and decreased conduction velocity (A Elbow-B Elbow, 42 m/s).  Left median and right ulnar motor responses are within normal limits. Chronic motor axonal loss changes are seen affecting the left first dorsal interosseous and abductor digiti minimi muscles, without accompanying active denervation.  Needle electrode examination of the right upper extremity is within normal limits.  Impression: Right median neuropathy at or distal to the wrist, consistent with a clinical diagnosis of carpal tunnel syndrome.  Overall, these findings are moderate in degree electrically. Left ulnar neuropathy with slowing across the elbow, demyelinating, mild. There is no evidence of a cervical radiculopathy or diffuse myopathy affecting the upper extremities.   ___________________________ Nita Sickle, DO    Nerve Conduction Studies   Stim Site NR Peak (ms) Norm Peak (ms) O-P Amp (V) Norm O-P Amp  Left Median Anti Sensory (2nd Digit)  32 C  Wrist    3.5 <3.6 31.9 >15  Right Median Anti Sensory (2nd Digit)  32 C  Wrist    *4.3 <3.6 25.2 >15  Left Ulnar Anti Sensory (5th Digit)  32 C  Wrist    3.1 <3.1 18.4 >10   Right Ulnar Anti Sensory (5th Digit)  32 C  Wrist    3.0 <3.1 18.2 >10     Stim Site NR Onset (ms) Norm Onset (ms) O-P Amp (mV) Norm O-P Amp Site1 Site2 Delta-0 (ms) Dist (cm) Vel (m/s) Norm Vel (m/s)  Left Median Motor (Abd Poll Brev)  32 C  Wrist    3.8 <4.0 11.6 >6 Elbow Wrist 5.7 29.0 51 >50  Elbow    9.5  10.7         Right Median Motor (Abd Poll Brev)  32 C  Wrist    *4.6 <4.0 10.5 >6 Elbow Wrist 5.8 30.0 52 >50  Elbow    10.4  10.1         Left Ulnar Motor (Abd Dig Minimi)  32 C  Wrist    *3.4 <3.1 7.3 >7 B Elbow Wrist 4.0 22.0 55 >50  B Elbow    7.4  6.7  A Elbow B Elbow 2.4 10.0 *42 >50  A Elbow    9.8  6.0         Right Ulnar Motor (Abd Dig Minimi)  32 C  Wrist    2.9 <3.1 11.7 >7 B Elbow Wrist 4.3 22.0 51 >50  B Elbow    7.2  11.0  A Elbow B Elbow 1.9 10.0 53 >50  A Elbow    9.1  10.0            Stim Site NR Peak (ms) Norm Peak (ms) P-T Amp (V) Site1 Site2 Delta-P (ms) Norm Delta (ms)  Left Median/Ulnar Palm  Comparison (Wrist - 8cm)  32 C  Median Palm    2.1 <2.2 24.3 Median Palm Ulnar Palm 0.3   Ulnar Palm    1.8 <2.2 14.9       Electromyography   Side Muscle Ins.Act Fibs Fasc Recrt Amp Dur Poly Activation Comment  Right 1stDorInt Nml Nml Nml Nml Nml Nml Nml Nml N/A  Right Abd Poll Brev Nml Nml Nml Nml Nml Nml Nml Nml N/A  Right PronatorTeres Nml Nml Nml Nml Nml Nml Nml Nml N/A  Right Biceps Nml Nml Nml Nml Nml Nml Nml Nml N/A  Right Triceps Nml Nml Nml Nml Nml Nml Nml Nml N/A  Right Deltoid Nml Nml Nml Nml Nml Nml Nml Nml N/A  Left 1stDorInt Nml Nml Nml *1- *1+ *1+ *1+ Nml N/A  Left PronatorTeres Nml Nml Nml Nml Nml Nml Nml Nml N/A  Left Biceps Nml Nml Nml Nml Nml Nml Nml Nml N/A  Left Triceps Nml Nml Nml Nml Nml Nml Nml Nml N/A  Left Deltoid Nml Nml Nml Nml Nml Nml Nml Nml N/A  Left Abd Dig Min Nml Nml Nml *1- *1+ *1+ *1+ Nml N/A  Left FlexCarpiUln Nml Nml Nml Nml Nml Nml Nml Nml N/A      Waveforms:

## 2023-05-29 LAB — SEDIMENTATION RATE: Sed Rate: 19 mm/h (ref 0–30)

## 2023-05-29 LAB — HOUSE ACCOUNT TRACKING

## 2023-05-29 LAB — FOLATE: Folate: 13.4 ng/mL

## 2023-05-29 LAB — CLIENT EDUCATION TRACKING

## 2023-05-29 LAB — VITAMIN B12: Vitamin B-12: 491 pg/mL (ref 200–1100)

## 2023-05-29 LAB — C-REACTIVE PROTEIN: CRP: <3 mg/L (ref <8.0)

## 2023-05-31 ENCOUNTER — Telehealth: Payer: Self-pay

## 2023-05-31 NOTE — Telephone Encounter (Signed)
Pt called an informed  bloodwork all came back normal. Her nerve test of the arms showed a pinched nerve at the right wrist (carpal tunnel) and at the left elbow. She can wear a brace to help with it, but these would not cause the pain she is having, which is not from a neurological cause. Continue follow-up with PCP for the pain

## 2023-05-31 NOTE — Telephone Encounter (Signed)
-----   Message from Van Clines sent at 05/29/2023  2:49 PM EDT ----- Pls let her know the bloodwork all came back normal. Her nerve test of the arms showed a pinched nerve at the right wrist (carpal tunnel) and at the left elbow. She can wear a brace to help with it, but these would not cause the pain she is having, which is not from a neurological cause. Continue follow-up with PCP for the pain.

## 2023-06-13 ENCOUNTER — Other Ambulatory Visit: Payer: Self-pay | Admitting: Family Medicine

## 2023-06-13 DIAGNOSIS — F5101 Primary insomnia: Secondary | ICD-10-CM
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
## 2023-06-20 ENCOUNTER — Ambulatory Visit: Payer: Commercial Managed Care - HMO | Attending: Internal Medicine | Admitting: Internal Medicine

## 2023-06-20 ENCOUNTER — Encounter: Payer: Self-pay | Admitting: Internal Medicine

## 2023-06-20 VITALS — BP 138/84 | HR 76 | Ht 66.0 in | Wt 184.8 lb

## 2023-06-20 DIAGNOSIS — I10 Essential (primary) hypertension: Secondary | ICD-10-CM | POA: Diagnosis not present

## 2023-06-20 NOTE — Patient Instructions (Signed)
Medication Instructions:  Your physician recommends that you continue on your current medications as directed. Please refer to the Current Medication list given to you today.  *If you need a refill on your cardiac medications before your next appointment, please call your pharmacy*   Lab Work: NONE   If you have labs (blood work) drawn today and your tests are completely normal, you will receive your results only by: MyChart Message (if you have MyChart) OR A paper copy in the mail If you have any lab test that is abnormal or we need to change your treatment, we will call you to review the results.   Testing/Procedures: NONE    Follow-Up: At [facility], you and your health needs are our priority.  As part of our continuing mission to provide you with exceptional heart care, we have created designated Provider Care Teams.  These Care Teams include your primary Cardiologist (physician) and Advanced Practice Providers (APPs -  Physician Assistants and Nurse Practitioners) who all work together to provide you with the care you need, when you need it.  We recommend signing up for the patient portal called "MyChart".  Sign up information is provided on this After Visit Summary.  MyChart is used to connect with patients for Virtual Visits (Telemedicine).  Patients are able to view lab/test results, encounter notes, upcoming appointments, etc.  Non-urgent messages can be sent to your provider as well.   To learn more about what you can do with MyChart, go to [email or web address].    Your next appointment:    [place]   Provider:   You may see Dietrich Pates, MD or one of the following Advanced Practice Providers on your designated Care Team:   Randall An, PA-C  Jacolyn Reedy, PA-C     Other Instructions Thank you for choosing [facility]!

## 2023-07-07 ENCOUNTER — Ambulatory Visit: Payer: Managed Care, Other (non HMO) | Admitting: Family Medicine

## 2023-07-07 ENCOUNTER — Encounter: Payer: Self-pay | Admitting: Family Medicine

## 2023-07-07 VITALS — BP 119/73 | HR 76 | Ht 66.0 in | Wt 184.0 lb

## 2023-07-07 DIAGNOSIS — E1122 Type 2 diabetes mellitus with diabetic chronic kidney disease: Secondary | ICD-10-CM | POA: Diagnosis not present

## 2023-07-07 DIAGNOSIS — I10 Essential (primary) hypertension: Secondary | ICD-10-CM

## 2023-07-07 DIAGNOSIS — Z713 Dietary counseling and surveillance: Secondary | ICD-10-CM | POA: Diagnosis not present

## 2023-07-07 DIAGNOSIS — N1832 Chronic kidney disease, stage 3b: Secondary | ICD-10-CM

## 2023-07-07 DIAGNOSIS — Z111 Encounter for screening for respiratory tuberculosis: Secondary | ICD-10-CM | POA: Diagnosis not present

## 2023-07-07 MED ORDER — TRULICITY 4.5 MG/0.5ML ~~LOC~~ SOAJ: 4.5000 mg | SUBCUTANEOUS | 3 refills | Status: DC

## 2023-07-07 NOTE — Assessment & Plan Note (Signed)
Last Hemoglobin A1C 9.9, Labs ordered today awaiting results will follow up. Patient reportingTrulicity  mg injection once a week, Farxiga 10 mg daily,  Glipizide 10 mg twice daily. Discussed medication desired effects, potential side effects, and how to administer the medication. Nonpharmacological interventions such as low carb diet,high in protein, vegetables and fruit discussed. Educated on importance of physical activity 150 minutes per week. Discussed signs and symptoms of hypoglycemia, & hyperglycemia and need to present to the ED if symptoms occurs.Follow up in 3 months or sooner if needed. Patient verbalizes understanding regarding plan of care and all questions answered. Ophthalmology visit current, Foot exam within desired limits

## 2023-07-07 NOTE — Progress Notes (Unsigned)
Patient Office Visit   Subjective   Patient ID: Alexis Landry, female    DOB: October 10, 1970  Age: 52 y.o. MRN: [id number]  CC:  Chief Complaint  Patient presents with   Diabetes    HPI Kerrie A Kinkade 51 year old female, presents to clinic for chronic follow up. She  has a past medical history of Diabetic neuropathy (HCC), DM (diabetes mellitus), type 2, uncontrolled, Epiretinal membrane, right, GERD (gastroesophageal reflux disease), HTN (hypertension), Hyperlipidemia, Macular edema, diabetic (HCC), Rheumatoid arthritis (HCC), and Vitamin D deficiency.  Diabetes She presents for her follow-up diabetic visit. She has type 2 diabetes mellitus. Pertinent negatives for hypoglycemia include no dizziness, pallor, seizures or tremors. Associated symptoms include fatigue. Pertinent negatives for diabetes include no foot ulcerations, no polydipsia, no polyphagia and no polyuria. Pertinent negatives for hypoglycemia complications include no blackouts. Diabetic complications include peripheral neuropathy. Risk factors for coronary artery disease include dyslipidemia and hypertension. Current diabetic treatment includes diet and oral agent (dual therapy) (Trulicity injection). She is following a generally healthy diet. She participates in exercise intermittently. An ACE inhibitor/angiotensin II receptor blocker is being taken. She does not see a podiatrist.Eye exam is current.  Hypertension This is a chronic problem. The problem has been gradually improving since onset. Pertinent negatives include no palpitations, peripheral edema or shortness of breath. Risk factors for coronary artery disease include diabetes mellitus and dyslipidemia. Past treatments include calcium channel blockers, diuretics and angiotensin blockers. The current treatment provides significant improvement. There are no compliance problems.  Hypertensive end-organ damage includes kidney disease. Identifiable causes of hypertension  include chronic renal disease.      Outpatient Encounter Medications as of 07/07/2023  Medication Sig   ALPRAZolam (XANAX) 0.5 MG tablet TAKE 1 TABLET BY MOUTH AT BEDTIME.   amitriptyline (ELAVIL) 10 MG tablet TAKE 1 TABLET BY MOUTH EVERYDAY AT BEDTIME   amLODipine (NORVASC) 10 MG tablet Take 1 tablet (10 mg total) by mouth daily.   cloNIDine HCl (KAPVAY) 0.1 MG TB12 ER tablet Take 1/2 Tablet Daily at night   dorzolamide (TRUSOPT) 2 % ophthalmic solution 1 drop 2 (two) times daily.   Dulaglutide (TRULICITY) 4.5 MG/0.5ML SOPN Inject 4.5 mg as directed once a week.   FARXIGA 10 MG TABS tablet Take 1 tablet (10 mg total) by mouth every morning.   glipiZIDE (GLUCOTROL) 10 MG tablet Take 10 mg by mouth 2 (two) times daily.   glipiZIDE (GLUCOTROL) 5 MG tablet Take 5 mg by mouth 2 (two) times daily.   losartan-hydrochlorothiazide (HYZAAR) 100-25 MG tablet Take 1 tablet by mouth daily.   ofloxacin (OCUFLOX) 0.3 % ophthalmic solution Place 1 drop into both eyes every 4 (four) hours.   pantoprazole (PROTONIX) 40 MG tablet Take 1 tablet (40 mg total) by mouth daily.   rosuvastatin (CRESTOR) 20 MG tablet Take 1 tablet (20 mg total) by mouth daily.   timolol (TIMOPTIC) 0.5 % ophthalmic solution 1 drop 2 (two) times daily.   Vitamin D, Ergocalciferol, (DRISDOL) 1.25 MG (50000 UNIT) CAPS capsule Take 50,000 Units by mouth once a week.   [DISCONTINUED] Dulaglutide (TRULICITY) 3 MG/0.5ML SOPN Inject 3 mg as directed once a week.   No facility-administered encounter medications on file as of 07/07/2023.    Past Surgical History:  Procedure Laterality Date   COLONOSCOPY N/A 01/04/2017   Procedure: COLONOSCOPY;  Surgeon: West Bali, MD;  [facility];  Service: Endoscopy;  Laterality: N/A;  10:30am   KENALOG INJECTION Right 12/03/2018  Procedure: LUCENTIS 0.3MG  INJECTION;  Surgeon: Stephannie Li, MD;  Location: [facility] OR;  Service: Ophthalmology;  Laterality: Right;   LEFT HEART CATH AND CORONARY  ANGIOGRAPHY N/A 09/14/2021   Procedure: LEFT HEART CATH AND CORONARY ANGIOGRAPHY;  Surgeon: Lennette Bihari, MD;  Location: [facility] INVAS[facility] CV LAB;  Service: Cardiovascular;  Laterality: N/A;   MEMBRANE PEEL Right 12/03/2018   Procedure: MEMBRANE PEEL;  Surgeon: Stephannie Li, MD;  Location: [facility] OR;  Service: Ophthalmology;  Laterality: Right;   PARS PLANA VITRECTOMY Right 12/03/2018   Procedure: PARS PLANA VITRECTOMY WITH 25 GAUGE;  Surgeon: Stephannie Li, MD;  Location: [facility] OR;  Service: Ophthalmology;  Laterality: Right;   PHOTOCOAGULATION WITH LASER Right 12/03/2018   Procedure: PHOTOCOAGULATION WITH LASER;  Surgeon: Stephannie Li, MD;  Location: [facility] OR;  Service: Ophthalmology;  Laterality: Right;   TUBAL LIGATION      Review of Systems  Constitutional:  Positive for fatigue.  Respiratory:  Negative for shortness of breath.   Cardiovascular:  Negative for palpitations.  Skin:  Negative for pallor.  Neurological:  Negative for dizziness, tremors and seizures.  Endo/Heme/Allergies:  Negative for polydipsia and polyphagia.      Objective    BP 119/73   Pulse 76   Ht 5\' 6"  (1.676 m)   Wt 184 lb (83.5 kg)   LMP 09/19/2016 (Approximate)   SpO2 97%   BMI 29.70 kg/m   Physical Exam Vitals reviewed.  Constitutional:      General: She is not in acute distress.    Appearance: Normal appearance. She is not ill-appearing, toxic-appearing or diaphoretic.  HENT:     Head: Normocephalic.  Eyes:     General:        Right eye: No discharge.        Left eye: No discharge.     Conjunctiva/sclera: Conjunctivae normal.  Cardiovascular:     Rate and Rhythm: Normal rate.     Pulses: Normal pulses.     Heart sounds: Normal heart sounds.  Pulmonary:     Effort: Pulmonary effort is normal. No respiratory distress.     Breath sounds: Normal breath sounds.  Musculoskeletal:     Cervical back: Normal range of motion.  Skin:    General: Skin is warm and dry.     Capillary Refill: Capillary refill  takes less than 2 seconds.  Neurological:     Mental Status: She is alert.     Coordination: Coordination normal.     Gait: Gait normal.  Psychiatric:        Mood and Affect: Mood normal.        Behavior: Behavior normal.       Assessment & Plan:  Primary hypertension Assessment & Plan: Vitals:   07/07/23 0913  BP: 119/73  Blood pressure controlled in today's visit, Labs ordered  Continue Amlodipine 10 mg,  Clonidine 0.1 mg half a tablet, Losartan-hydrochlorothiazide 100-25 mg Continued discussion on DASH diet, low sodium diet and maintain a exercise routine for 150 minutes per week.   Orders: -     BMP8+eGFR -     Lipid panel -     CBC with Differential/Platelet  Type 2 diabetes mellitus with stage 3b chronic kidney disease, without long-term current use of insulin (HCC) Assessment & Plan: Last Hemoglobin A1C 9.9, Labs ordered today awaiting results will follow up. Patient reportingTrulicity  mg injection once a week, Farxiga 10 mg daily,  Glipizide 10 mg twice daily. Discussed medication desired effects, potential side  effects, and how to administer the medication. Nonpharmacological interventions such as low carb diet,high in protein, vegetables and fruit discussed. Educated on importance of physical activity 150 minutes per week. Discussed signs and symptoms of hypoglycemia, & hyperglycemia and need to present to the ED if symptoms occurs.Follow up in 3 months or sooner if needed. Patient verbalizes understanding regarding plan of care and all questions answered. Ophthalmology visit current, Foot exam within desired limits   Orders: -     Hemoglobin A1c  Weight loss counseling, encounter for -     Amb Ref to Medical Weight Management  Screening for tuberculosis -     TB Skin Test  Other orders -     Trulicity; Inject 4.5 mg as directed once a week.  Dispense: 2 mL; Refill: 3    Return in about 6 weeks (around 08/18/2023), or if symptoms worsen or fail to improve, for  Pap smear.   Cruzita Lederer Newman Nip, FNP

## 2023-07-07 NOTE — Patient Instructions (Signed)

## 2023-07-07 NOTE — Assessment & Plan Note (Signed)
Vitals:   07/07/23 0913  BP: 119/73  Blood pressure controlled in today's visit, Labs ordered  Continue Amlodipine 10 mg,  Clonidine 0.1 mg half a tablet, Losartan-hydrochlorothiazide 100-25 mg Continued discussion on DASH diet, low sodium diet and maintain a exercise routine for 150 minutes per week.

## 2023-07-08 ENCOUNTER — Other Ambulatory Visit: Payer: Self-pay | Admitting: Family Medicine

## 2023-07-08 DIAGNOSIS — Z111 Encounter for screening for respiratory tuberculosis: Secondary | ICD-10-CM

## 2023-07-08 LAB — CBC WITH DIFFERENTIAL/PLATELET
Basophils Absolute: 0 10*3/uL (ref 0.0–0.2)
Basos: 1 %
EOS (ABSOLUTE): 0.1 10*3/uL (ref 0.0–0.4)
Eos: 1 %
Hematocrit: 40.8 % (ref 34.0–46.6)
Hemoglobin: 12.5 g/dL (ref 11.1–15.9)
Immature Grans (Abs): 0 10*3/uL (ref 0.0–0.1)
Immature Granulocytes: 0 %
Lymphocytes Absolute: 2.2 10*3/uL (ref 0.7–3.1)
Lymphs: 54 %
MCH: 27.7 pg (ref 26.6–33.0)
MCHC: 30.6 g/dL — ABNORMAL LOW (ref 31.5–35.7)
MCV: 90 fL (ref 79–97)
Monocytes Absolute: 0.3 10*3/uL (ref 0.1–0.9)
Monocytes: 7 %
Neutrophils Absolute: 1.6 10*3/uL (ref 1.4–7.0)
Neutrophils: 37 %
Platelets: 310 10*3/uL (ref 150–450)
RBC: 4.52 x10E6/uL (ref 3.77–5.28)
RDW: 13.1 % (ref 11.7–15.4)
WBC: 4.2 10*3/uL (ref 3.4–10.8)

## 2023-07-08 LAB — BMP8+EGFR
BUN/Creatinine Ratio: 19 (ref 9–23)
BUN: 30 mg/dL — ABNORMAL HIGH (ref 6–24)
CO2: 24 mmol/L (ref 20–29)
Calcium: 9.5 mg/dL (ref 8.7–10.2)
Chloride: 103 mmol/L (ref 96–106)
Creatinine, Ser: 1.55 mg/dL — ABNORMAL HIGH (ref 0.57–1.00)
Glucose: 184 mg/dL — ABNORMAL HIGH (ref 70–99)
Potassium: 3.8 mmol/L (ref 3.5–5.2)
Sodium: 142 mmol/L (ref 134–144)
eGFR: 40 mL/min/{1.73_m2} — ABNORMAL LOW (ref >59)

## 2023-07-08 LAB — HEMOGLOBIN A1C
Est. average glucose Bld gHb Est-mCnc: 223 mg/dL
Hgb A1c MFr Bld: 9.4 % — ABNORMAL HIGH (ref 4.8–5.6)

## 2023-07-08 LAB — LIPID PANEL
Chol/HDL Ratio: 2.6 {ratio} (ref 0.0–4.4)
Cholesterol, Total: 171 mg/dL (ref 100–199)
HDL: 67 mg/dL (ref >39)
LDL Chol Calc (NIH): 86 mg/dL (ref 0–99)
Triglycerides: 97 mg/dL (ref 0–149)
VLDL Cholesterol Cal: 18 mg/dL (ref 5–40)

## 2023-07-10 ENCOUNTER — Other Ambulatory Visit: Payer: Self-pay | Admitting: Family Medicine

## 2023-07-10 DIAGNOSIS — N1832 Chronic kidney disease, stage 3b: Secondary | ICD-10-CM

## 2023-07-10 DIAGNOSIS — E1129 Type 2 diabetes mellitus with other diabetic kidney complication: Secondary | ICD-10-CM

## 2023-07-10 LAB — TB SKIN TEST
Induration: 0.01 mm
TB Skin Test: NEGATIVE

## 2023-07-10 MED ORDER — BASAGLAR KWIKPEN 100 UNIT/ML ~~LOC~~ SOPN: 12.0000 [IU] | PEN_INJECTOR | Freq: Every day | SUBCUTANEOUS | 1 refills | Status: DC

## 2023-07-12 ENCOUNTER — Other Ambulatory Visit: Payer: Self-pay | Admitting: Family Medicine

## 2023-07-12 ENCOUNTER — Encounter: Payer: Self-pay | Admitting: Family Medicine

## 2023-07-12 MED ORDER — TRULICITY 4.5 MG/0.5ML ~~LOC~~ SOAJ: 4.5000 mg | SUBCUTANEOUS | 3 refills | Status: DC

## 2023-07-14 ENCOUNTER — Ambulatory Visit: Payer: Commercial Managed Care - HMO | Admitting: Family Medicine

## 2023-07-18 ENCOUNTER — Other Ambulatory Visit: Payer: Self-pay | Admitting: Family Medicine

## 2023-07-18 MED ORDER — INSULIN PEN NEEDLE 30G X 5 MM MISC: 1 refills | Status: AC

## 2023-07-18 MED ORDER — LANCETS MISC. MISC: 1.0000 | Freq: Three times a day (TID) | 0 refills | Status: DC

## 2023-07-18 MED ORDER — DEXCOM G7 SENSOR MISC: 1 refills | Status: DC

## 2023-07-18 MED ORDER — LANCET DEVICE MISC: 1.0000 | Freq: Three times a day (TID) | 0 refills | Status: AC

## 2023-07-18 MED ORDER — BLOOD GLUCOSE MONITORING SUPPL DEVI: 1.0000 | Freq: Three times a day (TID) | 0 refills | Status: DC

## 2023-07-18 MED ORDER — BLOOD GLUCOSE TEST VI STRP: 1.0000 | ORAL_STRIP | Freq: Three times a day (TID) | 2 refills | Status: DC

## 2023-07-19 ENCOUNTER — Other Ambulatory Visit: Payer: Self-pay | Admitting: Family Medicine

## 2023-07-19 NOTE — Telephone Encounter (Signed)
30-Gauge Needle

## 2023-07-19 NOTE — Telephone Encounter (Signed)
30G X 5 MM MISC

## 2023-07-21 ENCOUNTER — Encounter: Payer: Self-pay | Admitting: Neurology

## 2023-07-21 ENCOUNTER — Ambulatory Visit (INDEPENDENT_AMBULATORY_CARE_PROVIDER_SITE_OTHER): Payer: Managed Care, Other (non HMO) | Admitting: Neurology

## 2023-07-21 VITALS — BP 116/71 | HR 52 | Ht 66.0 in | Wt 191.4 lb

## 2023-07-21 DIAGNOSIS — R29898 Other symptoms and signs involving the musculoskeletal system: Secondary | ICD-10-CM | POA: Diagnosis not present

## 2023-07-21 DIAGNOSIS — G5601 Carpal tunnel syndrome, right upper limb: Secondary | ICD-10-CM

## 2023-07-21 NOTE — Patient Instructions (Signed)
Good to see you doing better!  Referral will be sent to Hand specialist for right carpal tunnel syndrome  2. For the mild pinched nerve at left elbow, using a brace at the elbow can help protect the nerve  3. Discuss leg giving way and back pain with PCP, this is likely cause of symptoms  4. Follow-up as needed, call for any changes
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
## 2023-08-13 ENCOUNTER — Other Ambulatory Visit: Payer: Self-pay | Admitting: Family Medicine

## 2023-08-13 DIAGNOSIS — F5101 Primary insomnia: Secondary | ICD-10-CM

## 2023-08-17 NOTE — Progress Notes (Signed)
Established Patient Office Visit   Subjective  Patient ID: Alexis Landry, female    DOB: [birth year]  Age: 52 y.o. MRN: [id number]  Chief Complaint  Patient presents with   Hypertension    F//u   Diabetes    F/u    She  has a past medical history of Diabetic neuropathy (HCC), DM (diabetes mellitus), type 2, uncontrolled, Epiretinal membrane, right, GERD (gastroesophageal reflux disease), HTN (hypertension), Hyperlipidemia, Macular edema, diabetic (HCC), Rheumatoid arthritis (HCC), and Vitamin D deficiency.  HPI Patient presents to the clinic for for restless leg syndrome and peripheral neuropathy including reports of burning sensations, tingling, and discomfort in the lower extremities, particularly at night.  Review of Systems  Constitutional:  Negative for chills and fever.  Eyes:  Negative for blurred vision.  Respiratory:  Negative for shortness of breath.   Cardiovascular:  Negative for chest pain.  Gastrointestinal:  Negative for abdominal pain.  Musculoskeletal:  Positive for myalgias.  Neurological:  Positive for tingling.      Objective:     BP 134/83   Pulse 73   Ht 5\' 6"  (1.676 m)   Wt 190 lb 1.9 oz (86.2 kg)   LMP 09/19/2016 (Approximate)   SpO2 96%   BMI 30.69 kg/m  BP Readings from Last 3 Encounters:  08/18/23 134/83  07/21/23 116/71  07/07/23 119/73      Physical Exam Vitals reviewed.  Constitutional:      General: She is not in acute distress.    Appearance: Normal appearance. She is not ill-appearing, toxic-appearing or diaphoretic.  HENT:     Head: Normocephalic.  Eyes:     General:        Right eye: No discharge.        Left eye: No discharge.     Conjunctiva/sclera: Conjunctivae normal.  Cardiovascular:     Rate and Rhythm: Normal rate.     Pulses: Normal pulses.     Heart sounds: Normal heart sounds.  Pulmonary:     Effort: Pulmonary effort is normal. No respiratory distress.     Breath sounds: Normal breath sounds.   Musculoskeletal:        General: Normal range of motion.     Cervical back: Normal range of motion.  Skin:    General: Skin is warm and dry.     Capillary Refill: Capillary refill takes less than 2 seconds.  Neurological:     Mental Status: She is alert.     Coordination: Coordination normal.     Gait: Gait normal.  Psychiatric:        Mood and Affect: Mood normal.        Behavior: Behavior normal.      No results found for any visits on 08/18/23.  The 10-year ASCVD risk score (Arnett DK, et al., 2019) is: 8.2%    Assessment & Plan:  Muscle cramp -     Magnesium  Restless leg syndrome -     Gabapentin; Take 1 capsule (300 mg total) by mouth 3 (three) times daily as needed.  Dispense: 90 capsule; Refill: 3 -     Iron, TIBC and Ferritin Panel  Type 2 diabetes mellitus with peripheral neuropathy (HCC) Assessment & Plan: Labs ordered to rule out deficiency, Iron panel, Magnesium   On physical exam, there is diminished sensation to light touch and pinprick in the feet bilaterally, with no significant motor deficits or skin changes. Initiated treatment with gabapentin 300 mg PRN for symptom relief.  Patient counseled on medication use, potential side effects, and advised to follow up if symptoms persist or worsen.     Return in about 4 months (around 12/16/2023), or if symptoms worsen or fail to improve, for Pap smear, chronic follow-up.   Cruzita Lederer Newman Nip, FNP

## 2023-08-17 NOTE — Patient Instructions (Addendum)
        Great to see you today.  I have refilled the medication(s) we provide.    Glucose Monitoring:  Fasting Blood Glucose: This is the blood sugar level checked after not eating for at least 8 hours, typically in the morning. The target range is usually 80-130   After Meals (Postprandial): Blood sugar should be checked about 1-2 hours after eating. The target range is typically less than 200  Bedtime Blood Glucose: Before bed, it's important to check glucose levels to ensure they are stable. A common goal is 90-180 Insulin Administration:  When to Administer Insulin: Follow your prescribed insulin regimen, which may involve giving insulin at bedtime.  Avoiding Hypoglycemia: If your blood glucose is below 120 mg/dL, you should avoid giving yourself an insulin injection  Administering insulin when blood glucose is too low can lead to hypoglycemia.  Symptoms of Hypoglycemia (low blood sugar):  Shakiness or trembling Sweating Dizziness or lightheadedness Confusion or difficulty concentrating Feeling anxious or irritable Fast heartbeat Hunger In severe cases, loss of consciousness or seizures  If you experience these symptoms, check your blood sugar immediately. If it is low, follow the 15-15 rule: eat 15 grams of fast-acting carbohydrates (such as glucose tablets, juice, or candy), wait 15 minutes, and then recheck your blood sugar. If it remains low, repeat the process and seek medical advice if necessary.

## 2023-08-18 ENCOUNTER — Ambulatory Visit: Payer: Managed Care, Other (non HMO) | Admitting: Family Medicine

## 2023-08-18 ENCOUNTER — Encounter: Payer: Self-pay | Admitting: Family Medicine

## 2023-08-18 VITALS — BP 134/83 | HR 73 | Ht 66.0 in | Wt 190.1 lb

## 2023-08-18 DIAGNOSIS — G629 Polyneuropathy, unspecified: Secondary | ICD-10-CM | POA: Insufficient documentation

## 2023-08-18 DIAGNOSIS — R252 Cramp and spasm: Secondary | ICD-10-CM

## 2023-08-18 DIAGNOSIS — E1142 Type 2 diabetes mellitus with diabetic polyneuropathy: Secondary | ICD-10-CM | POA: Diagnosis not present

## 2023-08-18 DIAGNOSIS — G2581 Restless legs syndrome: Secondary | ICD-10-CM | POA: Diagnosis not present

## 2023-08-18 DIAGNOSIS — Z124 Encounter for screening for malignant neoplasm of cervix: Secondary | ICD-10-CM

## 2023-08-18 MED ORDER — GABAPENTIN 300 MG PO CAPS
300.0000 mg | ORAL_CAPSULE | Freq: Three times a day (TID) | ORAL | 3 refills | Status: DC | PRN
Start: 2023-08-18 — End: 2024-01-25

## 2023-08-18 NOTE — Assessment & Plan Note (Signed)
Labs ordered to rule out deficiency, Iron panel, Magnesium   On physical exam, there is diminished sensation to light touch and pinprick in the feet bilaterally, with no significant motor deficits or skin changes. Initiated treatment with gabapentin 300 mg PRN for symptom relief. Patient counseled on medication use, potential side effects, and advised to follow up if symptoms persist or worsen.

## 2023-08-18 NOTE — Progress Notes (Signed)
Flu and Tdap vaccination updated by [facility] pharmacy rep.

## 2023-08-19 LAB — IRON,TIBC AND FERRITIN PANEL
Ferritin: 98 ng/mL (ref 15–150)
Iron Saturation: 32 % (ref 15–55)
Iron: 80 ug/dL (ref 27–159)
Total Iron Binding Capacity: 251 ug/dL (ref 250–450)
UIBC: 171 ug/dL (ref 131–425)

## 2023-08-19 LAB — MAGNESIUM: Magnesium: 2.5 mg/dL — ABNORMAL HIGH (ref 1.6–2.3)

## 2023-08-20 ENCOUNTER — Other Ambulatory Visit: Payer: Self-pay | Admitting: Sports Medicine

## 2023-08-25 ENCOUNTER — Other Ambulatory Visit: Payer: Self-pay | Admitting: Family Medicine

## 2023-09-15 ENCOUNTER — Other Ambulatory Visit (HOSPITAL_COMMUNITY): Payer: Self-pay | Admitting: Nephrology

## 2023-09-15 DIAGNOSIS — E119 Type 2 diabetes mellitus without complications: Secondary | ICD-10-CM

## 2023-09-15 DIAGNOSIS — N1832 Chronic kidney disease, stage 3b: Secondary | ICD-10-CM

## 2023-09-19 ENCOUNTER — Other Ambulatory Visit: Payer: Self-pay | Admitting: Family Medicine

## 2023-09-22 ENCOUNTER — Ambulatory Visit (HOSPITAL_COMMUNITY)
Admission: RE | Admit: 2023-09-22 | Discharge: 2023-09-22 | Disposition: A | Discharge status: Home / Self Care | Payer: Commercial Managed Care - HMO | Source: Ambulatory Visit | Attending: Nephrology | Admitting: Nephrology

## 2023-09-22 DIAGNOSIS — N1832 Chronic kidney disease, stage 3b: Secondary | ICD-10-CM | POA: Insufficient documentation

## 2023-09-22 DIAGNOSIS — E119 Type 2 diabetes mellitus without complications: Secondary | ICD-10-CM | POA: Insufficient documentation

## 2023-10-12 ENCOUNTER — Other Ambulatory Visit: Payer: Self-pay | Admitting: Family Medicine

## 2023-10-12 DIAGNOSIS — F5101 Primary insomnia: Secondary | ICD-10-CM

## 2023-10-16 ENCOUNTER — Other Ambulatory Visit: Payer: Self-pay | Admitting: Nurse Practitioner

## 2023-10-16 ENCOUNTER — Encounter: Payer: Self-pay | Admitting: Nurse Practitioner

## 2023-10-16 ENCOUNTER — Ambulatory Visit: Payer: No Typology Code available for payment source | Admitting: Nurse Practitioner

## 2023-10-16 VITALS — BP 101/68 | HR 79 | Ht 66.0 in | Wt 193.2 lb

## 2023-10-16 DIAGNOSIS — E1122 Type 2 diabetes mellitus with diabetic chronic kidney disease: Secondary | ICD-10-CM

## 2023-10-16 DIAGNOSIS — E782 Mixed hyperlipidemia: Secondary | ICD-10-CM

## 2023-10-16 DIAGNOSIS — Z794 Long term (current) use of insulin: Secondary | ICD-10-CM | POA: Diagnosis not present

## 2023-10-16 DIAGNOSIS — E559 Vitamin D deficiency, unspecified: Secondary | ICD-10-CM

## 2023-10-16 DIAGNOSIS — N1832 Chronic kidney disease, stage 3b: Secondary | ICD-10-CM

## 2023-10-16 DIAGNOSIS — Z7985 Long-term (current) use of injectable non-insulin antidiabetic drugs: Secondary | ICD-10-CM

## 2023-10-16 DIAGNOSIS — Z7984 Long term (current) use of oral hypoglycemic drugs: Secondary | ICD-10-CM | POA: Diagnosis not present

## 2023-10-16 DIAGNOSIS — I10 Essential (primary) hypertension: Secondary | ICD-10-CM

## 2023-10-16 LAB — POCT GLYCOSYLATED HEMOGLOBIN (HGB A1C): Hemoglobin A1C: 8.8 % — AB (ref 4.0–5.6)

## 2023-10-16 MED ORDER — SEMAGLUTIDE (1 MG/DOSE) 4 MG/3ML ~~LOC~~ SOPN: 1.0000 mg | PEN_INJECTOR | SUBCUTANEOUS | 1 refills | Status: DC

## 2023-10-16 NOTE — Patient Instructions (Signed)

## 2023-10-16 NOTE — Telephone Encounter (Signed)
 She was previously on the 3 mg and 4.5 mg of the Trulicity and could not get them consistently thus the need to switch.  Would that count?  Wouldn't need to start her on the lowest dose as she was already on the upper doses of Trulicity.

## 2023-10-16 NOTE — Progress Notes (Signed)
 Endocrinology Consult Note       10/16/2023, 11:09 AM   Subjective:    Patient ID: Alexis Landry, female    DOB: 12-Oct-1970.  Alexis Landry is being seen in consultation for management of currently uncontrolled symptomatic diabetes requested by  Terry Wilhelmena Lloyd Hilario, FNP.   Past Medical History:  Diagnosis Date   Diabetic neuropathy (HCC)    DM (diabetes mellitus), type 2, uncontrolled    Epiretinal membrane, right    GERD (gastroesophageal reflux disease)    HTN (hypertension)    Hyperlipidemia    Macular edema, diabetic (HCC)    Rheumatoid arthritis (HCC)    Vitamin D  deficiency     Past Surgical History:  Procedure Laterality Date   COLONOSCOPY N/A 01/04/2017   Procedure: COLONOSCOPY;  Surgeon: Margo LITTIE Haddock, MD;  Location: [facility];  Service: Endoscopy;  Laterality: N/A;  10:30am   KENALOG  INJECTION Right 12/03/2018   Procedure: LUCENTIS  0.3MG  INJECTION;  Surgeon: Jarold Mayo, MD;  Location: [facility] OR;  Service: Ophthalmology;  Laterality: Right;   LEFT HEART CATH AND CORONARY ANGIOGRAPHY N/A 09/14/2021   Procedure: LEFT HEART CATH AND CORONARY ANGIOGRAPHY;  Surgeon: Burnard Debby DELENA, MD;  Location: [facility];  Service: Cardiovascular;  Laterality: N/A;   MEMBRANE PEEL Right 12/03/2018   Procedure: MEMBRANE PEEL;  Surgeon: Jarold Mayo, MD;  Location: [facility] OR;  Service: Ophthalmology;  Laterality: Right;   PARS PLANA VITRECTOMY Right 12/03/2018   Procedure: PARS PLANA VITRECTOMY WITH 25 GAUGE;  Surgeon: Jarold Mayo, MD;  Location: [facility] OR;  Service: Ophthalmology;  Laterality: Right;   PHOTOCOAGULATION WITH LASER Right 12/03/2018   Procedure: PHOTOCOAGULATION WITH LASER;  Surgeon: Jarold Mayo, MD;  Location: [facility] OR;  Service: Ophthalmology;  Laterality: Right;   TUBAL LIGATION      Social History   Socioeconomic History   Marital status: Married    Spouse name: Not on file    Number of children: Not on file   Years of education: Not on file   Highest education level: Not on file  Occupational History   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 Right    Are you currently employed ? 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 alone   Who lives with you? na   What type of home do you live in: 1 story or 2 story? 2 story    Social Drivers of Corporate Investment Banker Strain: Low Risk  (09/01/2022)   Overall Financial Resource Strain (CARDIA)    Difficulty of Paying Living Expenses: Not hard at  all  Food Insecurity: No Food Insecurity (09/01/2022)   Hunger Vital Sign    Worried About Running Out of Food in the Last Year: Never true    Ran Out of Food in the Last Year: Never true  Transportation Needs: No Transportation Needs (09/01/2022)   PRAPARE - Administrator, Civil Service (Medical): No    Lack of Transportation (Non-Medical): No  Physical Activity: Insufficiently Active (09/01/2022)   Exercise Vital Sign    Days of Exercise per Week: 2 days    Minutes of Exercise per Session: 20 min  Stress: No Stress Concern Present (09/01/2022)   [employer] of Occupational Health - Occupational Stress Questionnaire    Feeling of Stress : Only a little  Social Connections: Moderately Isolated (09/01/2022)   Social Connection and Isolation Panel [NHANES]    Frequency of Communication with Friends and Family: More than three times a week    Frequency of  Social Gatherings with Friends and Family: Three times a week    Attends Religious Services: 1 to 4 times per year    Active Member of Clubs or Organizations: No    Attends Banker Meetings: Never    Marital Status: Separated    Family History  Problem Relation Age of Onset   Diabetes Paternal Grandfather    Diabetes Paternal Grandmother    Diabetes Maternal Grandfather    Kidney disease Father    Heart disease Mother    Colon cancer Neg Hx     Outpatient Encounter Medications as of 10/16/2023  Medication Sig   ALPRAZolam  (XANAX ) 0.5 MG tablet TAKE 1 TABLET BY MOUTH EVERYDAY AT BEDTIME   amitriptyline  (ELAVIL ) 10 MG tablet TAKE 1 TABLET BY MOUTH EVERYDAY AT BEDTIME   amLODipine  (NORVASC ) 10 MG tablet Take 1 tablet (10 mg total) by mouth daily.   Blood Glucose Monitoring Suppl (ONE TOUCH ULTRA 2) w/Device KIT USE IN THE MORNING, AT NOON, AND AT BEDTIME   cloNIDine  HCl (KAPVAY ) 0.1 MG TB12 ER tablet Take 1/2 Tablet Daily at night   dorzolamide (TRUSOPT) 2 % ophthalmic solution 1 drop 2 (two) times daily.   gabapentin  (NEURONTIN ) 300 MG capsule Take 1 capsule (300 mg total) by mouth 3 (three) times daily as needed.   glipiZIDE  (GLUCOTROL ) 5 MG tablet TAKE 1 TABLET BY MOUTH TWICE A DAY   Glucose Blood (BLOOD GLUCOSE TEST STRIPS) STRP 1 each by In Vitro route in the morning, at noon, and at bedtime. May substitute to any manufacturer covered by patient's insurance.   Insulin  Glargine (BASAGLAR  KWIKPEN) 100 UNIT/ML Inject 12 Units into the skin at bedtime.   Insulin  Pen Needle 30G X 5 MM MISC Used as Directed   Lancets 30G MISC 1 EACH BY DOES NOT APPLY ROUTE IN THE MORNING, AT NOON, AND AT BEDTIME. MAY SUBSTITUTE TO ANY MANUFACTURER COVERED BY PATIENT'S INSURANCE.   losartan -hydrochlorothiazide  (HYZAAR) 100-25 MG tablet Take 1 tablet by mouth daily.   ofloxacin (OCUFLOX) 0.3 % ophthalmic solution Place 1 drop into both eyes every 4 (four) hours.   rosuvastatin  (CRESTOR ) 20 MG  tablet Take 1 tablet (20 mg total) by mouth daily.   Semaglutide , 1 MG/DOSE, 4 MG/3ML SOPN Inject 1 mg as directed once a week.   timolol (TIMOPTIC) 0.5 % ophthalmic solution 1 drop 2 (two) times daily.   Vitamin D , Ergocalciferol , (DRISDOL) 1.25 MG (50000 UNIT) CAPS capsule Take 50,000 Units by mouth once a week.   [DISCONTINUED] Dulaglutide  (TRULICITY ) 4.5 MG/0.5ML SOPN  Inject 4.5 mg as directed once a week.   [DISCONTINUED] glipiZIDE  (GLUCOTROL ) 10 MG tablet Take 10 mg by mouth 2 (two) times daily.   Continuous Glucose Sensor (DEXCOM G7 SENSOR) MISC Use as directed (Patient not taking: Reported on 10/16/2023)   FARXIGA  10 MG TABS tablet Take 1 tablet (10 mg total) by mouth every morning. (Patient not taking: Reported on 10/16/2023)   pantoprazole  (PROTONIX ) 40 MG tablet Take 1 tablet (40 mg total) by mouth daily. (Patient not taking: Reported on 10/16/2023)   No facility-administered encounter medications on file as of 10/16/2023.    ALLERGIES: Allergies  Allergen Reactions   Lisinopril Anaphylaxis    Throat swelling    Sulfa Antibiotics Itching    VACCINATION STATUS: Immunization History  Administered Date(s) Administered   Hepatitis B, ADULT 06/04/2019   Influenza, Mdck, Trivalent,PF 6+ MOS(egg free) 07/17/2023   Influenza,inj,Quad PF,6+ Mos 06/04/2019, 09/14/2021   Pneumococcal Polysaccharide-23 06/04/2019   Tdap 06/04/2019    Diabetes She presents for her initial diabetic visit. She has type 2 diabetes mellitus. Onset time: diagnosed at age 82. Her disease course has been improving. Hypoglycemia symptoms include nervousness/anxiousness, sweats and tremors. Associated symptoms include blurred vision (hx retinopathy with injections), fatigue and polyuria. There are no hypoglycemic complications. Symptoms are stable. Diabetic complications include nephropathy and retinopathy. Risk factors for coronary artery disease include diabetes mellitus, family history, hypertension and  dyslipidemia. Current diabetic treatment includes insulin  injections and oral agent (monotherapy) (basaglar  12 units nightly, Glipizide  15 mg twice daily, Trulicity  3 mg weekly). Her weight is stable. She is following a generally unhealthy diet. When asked about meal planning, she reported none. She has not had a previous visit with a dietitian. She rarely participates in exercise. (She presents today for her consultation with her meter, no logs, showing glucose ranging between 91-227, she does not check glucose every single day.  Her POCT A1c today is 8.8%, improving from last A1c of 9.4%.  She drinks water  and zero sugar soda.  She does not eat on any routine pattern, finds she is not hungry.  She admits she does not exercise optimally.  She is UTD on eye exam, has never seen podiatry in the past.) An ACE inhibitor/angiotensin II receptor blocker is being taken. She does not see a podiatrist.Eye exam is current.     Review of systems  Constitutional: + Minimally fluctuating body weight, current Body mass index is 31.18 kg/m., no fatigue, no subjective hyperthermia, no subjective hypothermia, + decreased appetite Eyes: + blurry vision, no xerophthalmia ENT: no sore throat, no nodules palpated in throat, no dysphagia/odynophagia, no hoarseness Cardiovascular: no chest pain, no shortness of breath, no palpitations, no leg swelling Respiratory: no cough, no shortness of breath Gastrointestinal: no nausea/vomiting/diarrhea, + constipation Musculoskeletal: no muscle/joint aches Skin: no rashes, no hyperemia Neurological: no tremors, no numbness, no tingling, no dizziness Psychiatric: no depression, no anxiety  Objective:     BP 101/68 (BP Location: Left Arm, Patient Position: Sitting, Cuff Size: Large)   Pulse 79   Ht 5' 6 (1.676 m)   Wt 193 lb 3.2 oz (87.6 kg)   LMP 09/19/2016 (Approximate)   BMI 31.18 kg/m   Wt Readings from Last 3 Encounters:  10/16/23 193 lb 3.2 oz (87.6 kg)  08/18/23  190 lb 1.9 oz (86.2 kg)  07/21/23 191 lb 6.4 oz (86.8 kg)     BP Readings from Last 3 Encounters:  10/16/23 101/68  08/18/23 134/83  07/21/23 116/71  Physical Exam- Limited  Constitutional:  Body mass index is 31.18 kg/m. , not in acute distress, normal state of mind Eyes:  EOMI, no exophthalmos Neck: Supple Cardiovascular: RRR, no murmurs, rubs, or gallops, no edema Respiratory: Adequate breathing efforts, no crackles, rales, rhonchi, or wheezing Musculoskeletal: no gross deformities, strength intact in all four extremities, no gross restriction of joint movements Skin:  no rashes, no hyperemia Neurological: no tremor with outstretched hands   Diabetic Foot Exam - Simple   No data filed      CMP ( most recent) CMP     Component Value Date/Time   NA 142 07/07/2023 1012   K 3.8 07/07/2023 1012   CL 103 07/07/2023 1012   CO2 24 07/07/2023 1012   GLUCOSE 184 (H) 07/07/2023 1012   GLUCOSE 196 (H) 03/18/2023 1327   BUN 30 (H) 07/07/2023 1012   CREATININE 1.55 (H) 07/07/2023 1012   CALCIUM  9.5 07/07/2023 1012   PROT 7.2 04/19/2023 1450   PROT 6.8 12/21/2022 0907   ALBUMIN 4.2 12/21/2022 0907   AST 16 12/21/2022 0907   ALT 11 12/21/2022 0907   ALKPHOS 56 12/21/2022 0907   BILITOT 1.2 12/21/2022 0907   EGFR 40 (L) 07/07/2023 1012   GFRNONAA 48 (L) 03/18/2023 1327     Diabetic Labs (most recent): Lab Results  Component Value Date   HGBA1C 8.8 (A) 10/16/2023   HGBA1C 9.4 (H) 07/07/2023   HGBA1C 9.9 (H) 04/14/2023     Lipid Panel ( most recent) Lipid Panel     Component Value Date/Time   CHOL 171 07/07/2023 1012   TRIG 97 07/07/2023 1012   HDL 67 07/07/2023 1012   CHOLHDL 2.6 07/07/2023 1012   CHOLHDL 3.2 09/14/2021 0450   VLDL 20 09/14/2021 0450   LDLCALC 86 07/07/2023 1012   LABVLDL 18 07/07/2023 1012      Lab Results  Component Value Date   TSH 1.13 04/19/2023   TSH 2.090 12/21/2022   FREET4 1.12 12/21/2022           Assessment &  Plan:   1) Type 2 diabetes mellitus with stage 3b chronic kidney disease, with long-term current use of insulin  (HCC) (Primary)  She presents today for her consultation with her meter, no logs, showing glucose ranging between 91-227, she does not check glucose every single day.  Her POCT A1c today is 8.8%, improving from last A1c of 9.4%.  She drinks water  and zero sugar soda.  She does not eat on any routine pattern, finds she is not hungry.  She admits she does not exercise optimally.  She is UTD on eye exam, has never seen podiatry in the past.  - Alexis Landry has currently uncontrolled symptomatic type 2 DM since 53 years of age, with most recent A1c of 8.8 %.   -Recent labs reviewed.  - I had a long discussion with her about the progressive nature of diabetes and the pathology behind its complications. -her diabetes is complicated by CKD stage 3a, and retinopathy and she remains at a high risk for more acute and chronic complications which include CAD, CVA, CKD, retinopathy, and neuropathy. These are all discussed in detail with her.  The following Lifestyle Medicine recommendations according to American College of Lifestyle Medicine [facility]) were discussed and offered to patient and she agrees to start the journey:  A. Whole Foods, Plant-based plate comprising of fruits and vegetables, plant-based proteins, whole-grain carbohydrates was discussed in detail with the patient.   A  list for source of those nutrients were also provided to the patient.  Patient will use only water  or unsweetened tea for hydration. B.  The need to stay away from risky substances including alcohol, smoking; obtaining 7 to 9 hours of restorative sleep, at least 150 minutes of moderate intensity exercise weekly, the importance of healthy social connections,  and stress reduction techniques were discussed. C.  A full color page of  Calorie density of various food groups per pound showing examples of each food groups was  provided to the patient.  - I have counseled her on diet and weight management by adopting a carbohydrate restricted/protein rich diet. Patient is encouraged to switch to unprocessed or minimally processed complex starch and increased protein intake (animal or plant source), fruits, and vegetables. -  she is advised to stick to a routine mealtimes to eat 3 meals a day and avoid unnecessary snacks (to snack only to correct hypoglycemia).   - she acknowledges that there is a room for improvement in her food and drink choices. - Suggestion is made for her to avoid simple carbohydrates from her diet including Cakes, Sweet Desserts, Ice Cream, Soda (diet and regular), Sweet Tea, Candies, Chips, Cookies, Store Bought Juices, Alcohol in Excess of 1-2 drinks a day, Artificial Sweeteners, Coffee Creamer, and Sugar-free Products. This will help patient to have more stable blood glucose profile and potentially avoid unintended weight gain.  - I have approached her with the following individualized plan to manage her diabetes and patient agrees:   -She is advised to continue Basaglar  12 units SQ nightly (has a desire to come off of this in the future).  -she is encouraged to start/continue monitoring glucose 2 times daily (to start using her CGM), before breakfast and before bed, to log their readings on the clinic sheets provided, and bring them to review at follow up appointment in 3 months.  - she is warned not to take insulin  without proper monitoring per orders. - Adjustment parameters are given to her for hypo and hyperglycemia in writing. - she is encouraged to call clinic for blood glucose levels less than 70 or above 300 mg /dl. - she is advised to continue Glipizide  but at lower dose of 5 mg po twice daily with meals, therapeutically suitable for patient .  Will change her GLP1 to Ozempic  since she is having trouble getting the Trulicity  due to shortage.  I changed her to Ozempic  1 mg SQ  weekly.  - her Farxiga  will be discontinued, has not been taking this medication recently. - she is not a candidate for Metformin due to concurrent renal insufficiency.  - Specific targets for  A1c; LDL, HDL, and Triglycerides were discussed with the patient.  2) Blood Pressure /Hypertension:  her blood pressure is controlled to target.   she is advised to continue her current medications as prescribed by her PCP including ACE to protect kidneys from diabetes.  3) Lipids/Hyperlipidemia:    Review of her recent lipid panel from 07/07/23 showed controlled LDL at 86 .  she is advised to continue Crestor  20 mg daily at bedtime.  Side effects and precautions discussed with her.  4)  Weight/Diet:  her Body mass index is 31.18 kg/m.  -  clearly complicating her diabetes care.   she is a candidate for weight loss. I discussed with her the fact that loss of 5 - 10% of her  current body weight will have the most impact on her diabetes management.  Exercise, and detailed carbohydrates information provided  -  detailed on discharge instructions.  5) Chronic Care/Health Maintenance: -she is on ACEI/ARB and Statin medications and is encouraged to initiate and continue to follow up with Ophthalmology, Dentist, Podiatrist at least yearly or according to recommendations, and advised to stay away from smoking. I have recommended yearly flu vaccine and pneumonia vaccine at least every 5 years; moderate intensity exercise for up to 150 minutes weekly; and sleep for at least 7 hours a day.  - she is advised to maintain close follow up with Del Wilhelmena Lloyd Sola, FNP for primary care needs, as well as her other providers for optimal and coordinated care.   - Time spent in this patient care: 60 min, of which > 50% was spent in counseling her about her diabetes and the rest reviewing her blood glucose logs, discussing her hypoglycemia and hyperglycemia episodes, reviewing her current and previous labs/studies  (including abstraction from other facilities) and medications doses and developing a long term treatment plan based on the latest standards of care/guidelines; and documenting her care.    Please refer to Patient Instructions for Blood Glucose Monitoring and Insulin /Medications Dosing Guide in media tab for additional information. Please also refer to Patient Self Inventory in the Media tab for reviewed elements of pertinent patient history.  Alexis Landry participated in the discussions, expressed understanding, and voiced agreement with the above plans.  All questions were answered to her satisfaction. she is encouraged to contact clinic should she have any questions or concerns prior to her return visit.     Follow up plan: - Return in about 3 months (around 01/14/2024) for Diabetes F/U with A1c in office, No previsit labs, Bring meter and logs.    Benton Rio, [facility] [address] [place], [place] [address] Phone: [phone] Fax: [phone]  10/16/2023, 11:09 AM

## 2023-11-08 ENCOUNTER — Other Ambulatory Visit: Payer: Self-pay | Admitting: Family Medicine

## 2023-12-01 ENCOUNTER — Other Ambulatory Visit: Payer: Self-pay | Admitting: Family Medicine

## 2023-12-01 DIAGNOSIS — F5101 Primary insomnia: Secondary | ICD-10-CM

## 2023-12-15 ENCOUNTER — Encounter: Payer: Self-pay | Admitting: Family Medicine

## 2023-12-15 ENCOUNTER — Ambulatory Visit: Payer: No Typology Code available for payment source | Admitting: Family Medicine

## 2023-12-15 VITALS — BP 135/85 | HR 68 | Resp 16 | Ht 66.0 in | Wt 195.1 lb

## 2023-12-15 DIAGNOSIS — E559 Vitamin D deficiency, unspecified: Secondary | ICD-10-CM

## 2023-12-15 DIAGNOSIS — E038 Other specified hypothyroidism: Secondary | ICD-10-CM

## 2023-12-15 DIAGNOSIS — N898 Other specified noninflammatory disorders of vagina: Secondary | ICD-10-CM

## 2023-12-15 DIAGNOSIS — R5383 Other fatigue: Secondary | ICD-10-CM

## 2023-12-15 DIAGNOSIS — I10 Essential (primary) hypertension: Secondary | ICD-10-CM

## 2023-12-15 MED ORDER — FARXIGA 10 MG PO TABS: 10.0000 mg | ORAL_TABLET | Freq: Every morning | ORAL | 3 refills | Status: DC

## 2023-12-15 NOTE — Progress Notes (Signed)
 Established Patient Office Visit   Subjective  Patient ID: Alexis Landry, female    DOB: [birth year]  Age: 53 y.o. MRN: [id number]  Chief Complaint  Patient presents with   Hypertension    4 month follow up   Diabetes    States Alexis Landry trulicity was stopped by another provider and they prescribed ozempic but Alexis Landry wasn't able to get it due to insurance and Alexis Landry wants to see what you want Alexis Landry to be taking for Alexis Landry diabetes    Alexis Landry  has a past medical history of Diabetic neuropathy (HCC), DM (diabetes mellitus), type 2, uncontrolled, Epiretinal membrane, right, GERD (gastroesophageal reflux disease), HTN (hypertension), Hyperlipidemia, Macular edema, diabetic (HCC), Rheumatoid arthritis (HCC), and Vitamin D deficiency.  HPI Patient presents to the clinic for chronic follow up.For the details of today's visit, please refer to assessment and plan.   Review of Systems  Constitutional:  Negative for chills and fever.  Eyes:  Negative for blurred vision.  Respiratory:  Negative for shortness of breath.   Cardiovascular:  Negative for chest pain.  Neurological:  Negative for dizziness and headaches.      Objective:     BP 135/85   Pulse 68   Resp 16   Ht 5\' 6"  (1.676 m)   Wt 195 lb 1.9 oz (88.5 kg)   LMP 09/19/2016 (Approximate)   SpO2 98%   BMI 31.49 kg/m  BP Readings from Last 3 Encounters:  12/15/23 135/85  10/16/23 101/68  08/18/23 134/83      Physical Exam Vitals reviewed.  Constitutional:      General: Alexis Landry is not in acute distress.    Appearance: Normal appearance. Alexis Landry is not ill-appearing, toxic-appearing or diaphoretic.  HENT:     Head: Normocephalic.     Right Ear: Tympanic membrane normal.     Left Ear: Tympanic membrane normal.  Eyes:     General:        Right eye: No discharge.        Left eye: No discharge.     Conjunctiva/sclera: Conjunctivae normal.     Pupils: Pupils are equal, round, and reactive to light.  Cardiovascular:     Rate and Rhythm: Normal  rate.     Pulses: Normal pulses.     Heart sounds: Normal heart sounds.  Pulmonary:     Effort: Pulmonary effort is normal. No respiratory distress.     Breath sounds: Normal breath sounds.  Abdominal:     General: Bowel sounds are normal.     Palpations: Abdomen is soft.     Tenderness: There is no abdominal tenderness. There is no right CVA tenderness, left CVA tenderness or guarding.  Musculoskeletal:        General: Normal range of motion.  Skin:    General: Skin is warm and dry.     Capillary Refill: Capillary refill takes less than 2 seconds.  Neurological:     Mental Status: Alexis Landry is alert.     Coordination: Coordination normal.     Gait: Gait normal.  Psychiatric:        Mood and Affect: Mood normal.        Behavior: Behavior normal.      No results found for any visits on 12/15/23.  The 10-year ASCVD risk score (Arnett DK, et al., 2019) is: 9.1%    Assessment & Plan:  Primary hypertension Assessment & Plan: Vitals:   12/15/23 0849 12/15/23 0916  BP: 133/78 135/85  Blood pressure  controlled in today's visit, Labs ordered  Continue Amlodipine 10 mg,  Clonidine 0.1 mg half a tablet, Losartan-hydrochlorothiazide 100-25 mg Labs ordered. Discussed with  patient to monitor their blood pressure regularly and maintain a heart-healthy diet rich in fruits, vegetables, whole grains, and low-fat dairy, while reducing sodium intake to less than 2,300 mg per day. Regular physical activity, such as 30 minutes of moderate exercise most days of the week, will help lower blood pressure and improve overall cardiovascular health. Avoiding smoking, limiting alcohol consumption, and managing stress. Take  prescribed medication, & take it as directed and avoid skipping doses. Seek emergency care if your blood pressure is (over 180/100) or you experience chest pain, shortness of breath, or sudden vision changes.Patient verbalizes understanding regarding plan of care and all questions  answered.   Orders: -     BMP8+eGFR -     CBC with Differential/Platelet -     Lipid panel  TSH (thyroid-stimulating hormone deficiency) -     TSH + free T4  Vitamin D deficiency -     VITAMIN D 25 Hydroxy (Vit-D Deficiency, Fractures)  Other fatigue -     Estrogens, total  Vaginal discharge -     NuSwab Vaginitis Plus (VG+)  Other orders -     Farxiga; Take 1 tablet (10 mg total) by mouth every morning.  Dispense: 30 tablet; Refill: 3    Return in about 6 months (around 06/16/2024), or if symptoms worsen or fail to improve, for hypertension.   Cruzita Lederer Newman Nip, FNP

## 2023-12-15 NOTE — Patient Instructions (Signed)

## 2023-12-15 NOTE — Assessment & Plan Note (Signed)
 Vitals:   12/15/23 0849 12/15/23 0916  BP: 133/78 135/85  Blood pressure controlled in today's visit, Labs ordered  Continue Amlodipine 10 mg,  Clonidine 0.1 mg half a tablet, Losartan-hydrochlorothiazide 100-25 mg Labs ordered. Discussed with  patient to monitor their blood pressure regularly and maintain a heart-healthy diet rich in fruits, vegetables, whole grains, and low-fat dairy, while reducing sodium intake to less than 2,300 mg per day. Regular physical activity, such as 30 minutes of moderate exercise most days of the week, will help lower blood pressure and improve overall cardiovascular health. Avoiding smoking, limiting alcohol consumption, and managing stress. Take  prescribed medication, & take it as directed and avoid skipping doses. Seek emergency care if your blood pressure is (over 180/100) or you experience chest pain, shortness of breath, or sudden vision changes.Patient verbalizes understanding regarding plan of care and all questions answered.

## 2023-12-16 LAB — BMP8+EGFR
BUN/Creatinine Ratio: 21 (ref 9–23)
BUN: 25 mg/dL — ABNORMAL HIGH (ref 6–24)
CO2: 23 mmol/L (ref 20–29)
Calcium: 9.6 mg/dL (ref 8.7–10.2)
Chloride: 104 mmol/L (ref 96–106)
Creatinine, Ser: 1.21 mg/dL — ABNORMAL HIGH (ref 0.57–1.00)
Glucose: 172 mg/dL — ABNORMAL HIGH (ref 70–99)
Potassium: 3.9 mmol/L (ref 3.5–5.2)
Sodium: 142 mmol/L (ref 134–144)
eGFR: 53 mL/min/{1.73_m2} — ABNORMAL LOW (ref >59)

## 2023-12-16 LAB — CBC WITH DIFFERENTIAL/PLATELET
Basophils Absolute: 0 10*3/uL (ref 0.0–0.2)
Basos: 1 %
EOS (ABSOLUTE): 0.1 10*3/uL (ref 0.0–0.4)
Eos: 2 %
Hematocrit: 37.7 % (ref 34.0–46.6)
Hemoglobin: 12 g/dL (ref 11.1–15.9)
Immature Grans (Abs): 0 10*3/uL (ref 0.0–0.1)
Immature Granulocytes: 0 %
Lymphocytes Absolute: 2.2 10*3/uL (ref 0.7–3.1)
Lymphs: 48 %
MCH: 28 pg (ref 26.6–33.0)
MCHC: 31.8 g/dL (ref 31.5–35.7)
MCV: 88 fL (ref 79–97)
Monocytes Absolute: 0.3 10*3/uL (ref 0.1–0.9)
Monocytes: 7 %
Neutrophils Absolute: 1.9 10*3/uL (ref 1.4–7.0)
Neutrophils: 42 %
Platelets: 290 10*3/uL (ref 150–450)
RBC: 4.29 x10E6/uL (ref 3.77–5.28)
RDW: 13.3 % (ref 11.7–15.4)
WBC: 4.6 10*3/uL (ref 3.4–10.8)

## 2023-12-16 LAB — LIPID PANEL
Chol/HDL Ratio: 2.5 ratio (ref 0.0–4.4)
Cholesterol, Total: 163 mg/dL (ref 100–199)
HDL: 66 mg/dL (ref >39)
LDL Chol Calc (NIH): 77 mg/dL (ref 0–99)
Triglycerides: 114 mg/dL (ref 0–149)
VLDL Cholesterol Cal: 20 mg/dL (ref 5–40)

## 2023-12-16 LAB — VITAMIN D 25 HYDROXY (VIT D DEFICIENCY, FRACTURES): Vit D, 25-Hydroxy: 29.4 ng/mL — ABNORMAL LOW (ref 30.0–100.0)

## 2023-12-16 LAB — TSH+FREE T4
Free T4: 1.15 ng/dL (ref 0.82–1.77)
TSH: 2.41 u[IU]/mL (ref 0.450–4.500)

## 2023-12-17 LAB — ESTROGENS, TOTAL: Estrogen: 102 pg/mL

## 2023-12-19 ENCOUNTER — Ambulatory Visit: Payer: Commercial Managed Care - HMO | Admitting: Internal Medicine

## 2023-12-19 LAB — NUSWAB VAGINITIS PLUS (VG+)
Candida albicans, NAA: NEGATIVE
Candida glabrata, NAA: NEGATIVE
Chlamydia trachomatis, NAA: NEGATIVE
Neisseria gonorrhoeae, NAA: NEGATIVE

## 2023-12-20 ENCOUNTER — Encounter: Payer: Self-pay | Admitting: Family Medicine

## 2023-12-20 ENCOUNTER — Other Ambulatory Visit: Payer: Self-pay | Admitting: Family Medicine

## 2023-12-20 MED ORDER — METRONIDAZOLE 500 MG PO TABS: 500.0000 mg | ORAL_TABLET | Freq: Two times a day (BID) | ORAL | 0 refills | Status: AC

## 2023-12-21 ENCOUNTER — Other Ambulatory Visit: Payer: Self-pay | Admitting: Family Medicine

## 2023-12-21 NOTE — Telephone Encounter (Signed)
 I sent in Metronidazole yesterday  And Aprazalom refill is due next week

## 2024-01-05 ENCOUNTER — Other Ambulatory Visit: Payer: Self-pay | Admitting: Sports Medicine

## 2024-01-10 ENCOUNTER — Encounter: Payer: Self-pay | Admitting: Family Medicine

## 2024-01-10 ENCOUNTER — Other Ambulatory Visit: Payer: Self-pay | Admitting: Family Medicine

## 2024-01-10 DIAGNOSIS — F5101 Primary insomnia: Secondary | ICD-10-CM

## 2024-01-10 MED ORDER — ALPRAZOLAM 0.5 MG PO TABS: 0.5000 mg | ORAL_TABLET | Freq: Every evening | ORAL | 0 refills | Status: DC | PRN

## 2024-01-15 ENCOUNTER — Ambulatory Visit: Payer: No Typology Code available for payment source | Admitting: Nurse Practitioner

## 2024-01-18 ENCOUNTER — Ambulatory Visit: Payer: No Typology Code available for payment source | Admitting: Nurse Practitioner

## 2024-01-18 DIAGNOSIS — I10 Essential (primary) hypertension: Secondary | ICD-10-CM

## 2024-01-18 DIAGNOSIS — Z7985 Long-term (current) use of injectable non-insulin antidiabetic drugs: Secondary | ICD-10-CM

## 2024-01-18 DIAGNOSIS — Z794 Long term (current) use of insulin: Secondary | ICD-10-CM

## 2024-01-18 DIAGNOSIS — Z7984 Long term (current) use of oral hypoglycemic drugs: Secondary | ICD-10-CM

## 2024-01-18 DIAGNOSIS — E782 Mixed hyperlipidemia: Secondary | ICD-10-CM

## 2024-01-18 DIAGNOSIS — E559 Vitamin D deficiency, unspecified: Secondary | ICD-10-CM

## 2024-01-25 ENCOUNTER — Other Ambulatory Visit: Payer: Self-pay | Admitting: Family Medicine

## 2024-01-25 DIAGNOSIS — G2581 Restless legs syndrome: Secondary | ICD-10-CM

## 2024-01-26 ENCOUNTER — Other Ambulatory Visit: Payer: Self-pay | Admitting: Family Medicine

## 2024-01-31 ENCOUNTER — Ambulatory Visit: Payer: Self-pay | Attending: Student | Admitting: Physician Assistant

## 2024-01-31 ENCOUNTER — Encounter: Payer: Self-pay | Admitting: Physician Assistant

## 2024-01-31 VITALS — BP 138/80 | HR 80 | Ht 66.0 in | Wt 193.6 lb

## 2024-01-31 DIAGNOSIS — I340 Nonrheumatic mitral (valve) insufficiency: Secondary | ICD-10-CM

## 2024-01-31 DIAGNOSIS — E785 Hyperlipidemia, unspecified: Secondary | ICD-10-CM

## 2024-01-31 DIAGNOSIS — R42 Dizziness and giddiness: Secondary | ICD-10-CM

## 2024-01-31 DIAGNOSIS — Z794 Long term (current) use of insulin: Secondary | ICD-10-CM

## 2024-01-31 DIAGNOSIS — R5383 Other fatigue: Secondary | ICD-10-CM

## 2024-01-31 DIAGNOSIS — I10 Essential (primary) hypertension: Secondary | ICD-10-CM | POA: Diagnosis not present

## 2024-01-31 DIAGNOSIS — E13319 Other specified diabetes mellitus with unspecified diabetic retinopathy without macular edema: Secondary | ICD-10-CM

## 2024-01-31 MED ORDER — ROSUVASTATIN CALCIUM 40 MG PO TABS: 40.0000 mg | ORAL_TABLET | Freq: Every day | ORAL | 3 refills | Status: AC

## 2024-01-31 NOTE — Progress Notes (Signed)
 Cardiology Office Note:  .   Date:  01/31/2024  ID:  Alexis Landry, DOB 1971/05/04, MRN [id number] PCP: Rosanna Comment, FNP  [facility] Providers Cardiologist:  Ola Berger, MD { History of Present Illness: .   Alexis Landry is a 53 y.o. female with HTN, HLD, DM, hx of CP (LHC 2022: minimal CAD) who presents to OV for 6 m/o follow up.   Last seen in OV with Dr. Avanell Bob on 06/22/2023. She noted some dizziness associated with position changes. BP was 138/84. No medication changes. Continued on Amlodipine  10 mg, Clonidine  0.1 mg 1/2 tablet nightly, Farxiga  10 mg, Losartan -hydrochlorothiazide  100-25 mg, and Crestor  20 mg daily.   On interview, reported fatigue and dizziness is still present but has improved. Associated with position changes after getting out of bed in the morning. Planning to follow up with neurology.  Denied any chest pain, shortness of breath, palpitations, syncope, presyncope, orthopnea, PND, edema, or claudication. 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  Able to complete daily errands and grocery shopping. Reports mainly eating out. Only drink 4 12 oz of water  daily. Reported medication compliance.   Studies Reviewed: .       Echo  09/2021 IMPRESSIONS   1. Left ventricular ejection fraction, by estimation, is 65 to 70%. The  left ventricle has normal function. The left ventricle has no regional  wall motion abnormalities. There is moderate concentric left ventricular  hypertrophy. Left ventricular  diastolic parameters are indeterminate. Elevated left ventricular  end-diastolic pressure.   2. Right ventricular systolic function is normal. The right ventricular  size is normal. Tricuspid regurgitation signal is inadequate for assessing  PA pressure.   3. Left atrial size was upper normal.   4. A small pericardial effusion is present. The pericardial effusion is  posterior to the left ventricle and localized near the right  atrium.   5. The mitral valve is grossly normal. Mild mitral valve regurgitation.   6. The aortic valve is tricuspid. Aortic valve regurgitation is not  visualized.   7. The inferior vena cava is normal in size with greater than 50%  respiratory variability, suggesting right atrial pressure of 3 mmHg.   Comparison(s): Prior images reviewed side by side. No significant change  in LVEF. Elevated LVEDP. Small pericardial effusion.    05/2022  Carotid USN IMPRESSION: Color duplex indicates minimal heterogeneous plaque, with no hemodynamically significant stenosis by duplex criteria in the extracranial cerebrovascular circulation.  CATH 09/2021   Dist RCA lesion is 30% stenosed.   The left ventricular systolic function is normal.   LV end diastolic pressure is normal.   The left ventricular ejection fraction is 55-65% by visual estimate. No significant coronary obstructive disease with mild smooth  30% narrowing in the distal RCA proximal to the PDA takeoff.  The LAD and circumflex and the remainder of the the RCA appeared angiographically normal.   Normal LV function with EF estimated at 55 to 65% without focal segmental wall motion abnormalities.  LVEDP 12 mmHg.   RECOMMENDATION: Low-dose aspirin  therapy.  Medical therapy for mild nonobstructive CAD in this patient with hypertension and hyperlipidemia.  Aim for target LDL less than 70.      Physical Exam:   VS:  BP 138/80   Pulse 80   Ht 5\' 6"  (1.676 m)   Wt 193 lb 9.6 oz (87.8 kg)   LMP 09/19/2016 (Approximate)   SpO2 98%  BMI 31.25 kg/m    Wt Readings from Last 3 Encounters:  01/31/24 193 lb 9.6 oz (87.8 kg)  12/15/23 195 lb 1.9 oz (88.5 kg)  10/16/23 193 lb 3.2 oz (87.6 kg)    GEN: Well nourished, well developed in no acute distress NECK: No JVD; No carotid bruits CARDIAC: RRR, no rubs, gallops; 2/6 systolic murmur in left 3rd ICS.  RESPIRATORY:  Clear to auscultation without rales, wheezing or rhonchi  ABDOMEN: Soft,  non-tender, non-distended EXTREMITIES:  No edema; No deformity   ASSESSMENT AND PLAN: .   Dizziness  Still present but improved since last OV; associated with position changes after getting out of bed in the morning. Not associated with exertion. Planning to follow up with neurology. Strongly encouraged neuro follow up.  Discussed slow position changes and encouraged adequate H20 intake.  Less concern for cardiac cause with reassuring cardiac work up.   Fatigue  TSH, CBC WNL  Not on any BB. Could be 2/2 to clonidine , however, less likely with small dosage.   HTN  BP this OV: 138/80, home BP can range 130-150/80-90's. Discussed proper BP measurement techniques. Discussed If BP stays elevated above 150/90 to contact office.  12/2023: Cr 1.21, K 3.9. Ordered CMP for 06/2024 Continue Losartan -hydrochlorothiazide  100-25 mg, amlodipine  10 mg daily,  Clonidine  0.1 mg 1/2 tablet nightly  HLD 12/2023: LDL 77 (vs 04/2023: 115), goal <70 Increased Crestor  20 to 40 mg. Ordered FLP and CMP for 06/2024. Encourage lifestyles modifications.   DM  10/2023: A1C 8.8 Continue Insulin  and Farxiga  10 mg  Managed by PCP  Mild MVR  ECHO 09/2021: mild MVR  Systolic murmur on exam today.  Will monitor and plan for follow up ECHO    Dispo: Increased Crestor  40 mg, FLP and CMP in 6 m/o, follow up in 6 m/o  Signed, Metta Actis, PA-C

## 2024-01-31 NOTE — Patient Instructions (Signed)
 Medication Instructions:  Your physician has recommended you make the following change in your medication:   -Increase Crestor  (Rosuvastatin ) to 40 mg once daily   *If you need a refill on your cardiac medications before your next appointment, please call your pharmacy*  Lab Work: In 6 months: -Fasting Lipid Panel- nothing to eat/drink at least 6 hours prior to lab work -CMP  If you have labs (blood work) drawn today and your tests are completely normal, you will receive your results only by: MyChart Message (if you have MyChart) OR A paper copy in the mail If you have any lab test that is abnormal or we need to change your treatment, we will call you to review the results.  Testing/Procedures: None  Follow-Up: At [facility], you and your health needs are our priority.  As part of our continuing mission to provide you with exceptional heart care, our providers are all part of one team.  This team includes your primary Cardiologist (physician) and Advanced Practice Providers or APPs (Physician Assistants and Nurse Practitioners) who all work together to provide you with the care you need, when you need it.  Your next appointment:   6 month(s)  Provider:   You may see Ola Berger, MD or one of the following Advanced Practice Providers on your designated Care Team:   Woodfin Hays, PA-C  [place], [place] Theotis Flake, [place]     We recommend signing up for the patient portal called "MyChart".  Sign up information is provided on this After Visit Summary.  MyChart is used to connect with patients for Virtual Visits (Telemedicine).  Patients are able to view lab/test results, encounter notes, upcoming appointments, etc.  Non-urgent messages can be sent to your provider as well.   To learn more about what you can do with MyChart, go to [email or web address].   Other Instructions

## 2024-02-13 ENCOUNTER — Encounter: Payer: Self-pay | Admitting: Family Medicine

## 2024-02-14 ENCOUNTER — Other Ambulatory Visit: Payer: Self-pay

## 2024-02-14 MED ORDER — FARXIGA 10 MG PO TABS: 10.0000 mg | ORAL_TABLET | Freq: Every morning | ORAL | 3 refills | Status: DC

## 2024-02-14 MED ORDER — BASAGLAR KWIKPEN 100 UNIT/ML ~~LOC~~ SOPN: 12.0000 [IU] | PEN_INJECTOR | Freq: Every day | SUBCUTANEOUS | 1 refills | Status: DC

## 2024-02-14 NOTE — Telephone Encounter (Signed)
 Refills sent to pharmacy.

## 2024-02-29 ENCOUNTER — Encounter: Payer: Self-pay | Admitting: Nurse Practitioner

## 2024-02-29 ENCOUNTER — Ambulatory Visit: Admitting: Nurse Practitioner

## 2024-02-29 VITALS — BP 122/70 | HR 74 | Ht 66.0 in | Wt 193.0 lb

## 2024-02-29 DIAGNOSIS — E1122 Type 2 diabetes mellitus with diabetic chronic kidney disease: Secondary | ICD-10-CM | POA: Diagnosis not present

## 2024-02-29 DIAGNOSIS — Z794 Long term (current) use of insulin: Secondary | ICD-10-CM

## 2024-02-29 DIAGNOSIS — N1832 Chronic kidney disease, stage 3b: Secondary | ICD-10-CM

## 2024-02-29 DIAGNOSIS — I10 Essential (primary) hypertension: Secondary | ICD-10-CM

## 2024-02-29 DIAGNOSIS — Z7984 Long term (current) use of oral hypoglycemic drugs: Secondary | ICD-10-CM

## 2024-02-29 DIAGNOSIS — Z7985 Long-term (current) use of injectable non-insulin antidiabetic drugs: Secondary | ICD-10-CM

## 2024-02-29 DIAGNOSIS — E559 Vitamin D deficiency, unspecified: Secondary | ICD-10-CM

## 2024-02-29 DIAGNOSIS — E782 Mixed hyperlipidemia: Secondary | ICD-10-CM

## 2024-02-29 LAB — POCT GLYCOSYLATED HEMOGLOBIN (HGB A1C): Hemoglobin A1C: 7.5 % — AB (ref 4.0–5.6)

## 2024-02-29 MED ORDER — OZEMPIC (0.25 OR 0.5 MG/DOSE) 2 MG/3ML ~~LOC~~ SOPN: 0.5000 mg | PEN_INJECTOR | SUBCUTANEOUS | 1 refills | Status: DC

## 2024-02-29 NOTE — Progress Notes (Signed)
 Endocrinology Follow Up Note       02/29/2024, 3:18 PM   Subjective:    Patient ID: Alexis Landry, female    DOB: Jun 21, 1971.  Alexis Landry is being seen in follow up after being seen in consultation for management of currently uncontrolled symptomatic diabetes requested by  Del Abron Abt, FNP.   Past Medical History:  Diagnosis Date   Diabetic neuropathy (HCC)    DM (diabetes mellitus), type 2, uncontrolled    Epiretinal membrane, right    GERD (gastroesophageal reflux disease)    HTN (hypertension)    Hyperlipidemia    Macular edema, diabetic (HCC)    Rheumatoid arthritis (HCC)    Vitamin D  deficiency     Past Surgical History:  Procedure Laterality Date   COLONOSCOPY N/A 01/04/2017   Procedure: COLONOSCOPY;  Surgeon: Alyce Jubilee, MD;  Location: [facility];  Service: Endoscopy;  Laterality: N/A;  10:30am   KENALOG  INJECTION Right 12/03/2018   Procedure: LUCENTIS  0.3MG  INJECTION;  Surgeon: Linard Reno, MD;  Location: [facility] OR;  Service: Ophthalmology;  Laterality: Right;   LEFT HEART CATH AND CORONARY ANGIOGRAPHY N/A 09/14/2021   Procedure: LEFT HEART CATH AND CORONARY ANGIOGRAPHY;  Surgeon: Millicent Ally, MD;  Location: [facility];  Service: Cardiovascular;  Laterality: N/A;   MEMBRANE PEEL Right 12/03/2018   Procedure: MEMBRANE PEEL;  Surgeon: Linard Reno, MD;  Location: [facility] OR;  Service: Ophthalmology;  Laterality: Right;   PARS PLANA VITRECTOMY Right 12/03/2018   Procedure: PARS PLANA VITRECTOMY WITH 25 GAUGE;  Surgeon: Linard Reno, MD;  Location: [facility] OR;  Service: Ophthalmology;  Laterality: Right;   PHOTOCOAGULATION WITH LASER Right 12/03/2018   Procedure: PHOTOCOAGULATION WITH LASER;  Surgeon: Linard Reno, MD;  Location: [facility] OR;  Service: Ophthalmology;  Laterality: Right;   TUBAL LIGATION      Social History   Socioeconomic History   Marital status: Married     Spouse name: Not on file   Number of children: Not on file   Years of education: Not on file   Highest education level: Not on file  Occupational History   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 Right    Are you currently employed ? 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 alone   Who lives with you? na   What type of home do you live in: 1 story or 2 story? 2 story    Social Drivers of Corporate investment banker Strain: Low Risk  (09/01/2022)   Overall Financial Resource Strain (CARDIA)    Difficulty  of Paying Living Expenses: Not hard at all  Food Insecurity: No Food Insecurity (09/01/2022)   Hunger Vital Sign    Worried About Running Out of Food in the Last Year: Never true    Ran Out of Food in the Last Year: Never true  Transportation Needs: No Transportation Needs (09/01/2022)   PRAPARE - Administrator, Civil Service (Medical): No    Lack of Transportation (Non-Medical): No  Physical Activity: Insufficiently Active (09/01/2022)   Exercise Vital Sign    Days of Exercise per Week: 2 days    Minutes of Exercise per Session: 20 min  Stress: No Stress Concern Present (09/01/2022)   [employer]ational [employer] - Occupational Stress Questionnaire    Feeling of Stress : Only a little  Social Connections: Moderately Isolated (09/01/2022)   Social Connection and Isolation Panel [NHANES]    Frequency of Communication with Friends and Family: More than three  times a week    Frequency of Social Gatherings with Friends and Family: Three times a week    Attends Religious Services: 1 to 4 times per year    Active Member of Clubs or Organizations: No    Attends Banker Meetings: Never    Marital Status: Separated    Family History  Problem Relation Age of Onset   Diabetes Paternal Grandfather    Diabetes Paternal Grandmother    Diabetes Maternal Grandfather    Kidney disease Father    Heart disease Mother    Colon cancer Neg Hx     Outpatient Encounter Medications as of 02/29/2024  Medication Sig   ALPRAZolam  (XANAX ) 0.5 MG tablet Take 1 tablet (0.5 mg total) by mouth at bedtime as needed for anxiety.   amitriptyline  (ELAVIL ) 10 MG tablet TAKE 1 TABLET BY MOUTH EVERYDAY AT BEDTIME   amLODipine  (NORVASC ) 10 MG tablet TAKE 1 TABLET BY MOUTH EVERY DAY   Blood Glucose Monitoring Suppl (ONE TOUCH ULTRA 2) w/Device KIT USE IN THE MORNING, AT NOON, AND AT BEDTIME   cloNIDine  HCl (KAPVAY ) 0.1 MG TB12 ER tablet Take 1/2 Tablet Daily at night   dorzolamide (TRUSOPT) 2 % ophthalmic solution 1 drop 2 (two) times daily.   gabapentin  (NEURONTIN ) 300 MG capsule TAKE 1 CAPSULE BY MOUTH 3 TIMES DAILY AS NEEDED.   glipiZIDE (GLUCOTROL) 5 MG tablet TAKE 1 TABLET BY MOUTH TWICE A DAY   glucose blood (ONETOUCH ULTRA) test strip Test blood sugar three times per day dx e11.65   Insulin  Glargine (BASAGLAR  KWIKPEN) 100 UNIT/ML Inject 12 Units into the skin at bedtime.   Insulin  Pen Needle 30G X 5 MM MISC Used as Directed   Lancets 30G MISC 1 EACH BY DOES NOT APPLY ROUTE IN THE MORNING, AT NOON, AND AT BEDTIME. MAY SUBSTITUTE TO ANY MANUFACTURER COVERED BY PATIENT'S INSURANCE.   losartan -hydrochlorothiazide  (HYZAAR) 100-25 MG tablet Take 1 tablet by mouth daily.   ofloxacin (OCUFLOX) 0.3 % ophthalmic solution Place 1 drop into both eyes every 4 (four) hours.   pantoprazole  (PROTONIX ) 40 MG tablet Take 1 tablet (40 mg total) by mouth daily.   rosuvastatin   (CRESTOR ) 40 MG tablet Take 1 tablet (40 mg total) by mouth daily.   Semaglutide ,0.25 or 0.5MG /DOS, (OZEMPIC , 0.25 OR 0.5 MG/DOSE,) 2 MG/3ML SOPN Inject 0.5 mg into the skin once a week.   timolol (TIMOPTIC) 0.5 % ophthalmic solution 1 drop 2 (two) times daily.   Vitamin D , Ergocalciferol , (DRISDOL) 1.25 MG (50000 UNIT) CAPS capsule Take 50,000  Units by mouth once a week.   [DISCONTINUED] FARXIGA  10 MG TABS tablet Take 1 tablet (10 mg total) by mouth every morning.   [DISCONTINUED] Semaglutide , 1 MG/DOSE, (OZEMPIC , 1 MG/DOSE,) 4 MG/3ML SOPN INJECT 1 MG UNDER THE SKIN ONCE A WEEK AS DIRECTED (Patient not taking: Reported on 02/29/2024)   No facility-administered encounter medications on file as of 02/29/2024.    ALLERGIES: Allergies  Allergen Reactions   Lisinopril Anaphylaxis    Throat swelling    Sulfa Antibiotics Itching    VACCINATION STATUS: Immunization History  Administered Date(s) Administered   Hepatitis B, ADULT 06/04/2019   Influenza, Mdck, Trivalent,PF 6+ MOS(egg free) 07/17/2023   Influenza,inj,Quad PF,6+ Mos 06/04/2019, 09/14/2021   Pneumococcal Polysaccharide-23 06/04/2019   Tdap 06/04/2019    Diabetes She presents for her follow-up diabetic visit. She has type 2 diabetes mellitus. Onset time: diagnosed at age 24. Her disease course has been improving. There are no hypoglycemic associated symptoms. Associated symptoms include blurred vision (hx retinopathy with injections), fatigue and polyuria. There are no hypoglycemic complications. Symptoms are improving. Diabetic complications include nephropathy and retinopathy. Risk factors for coronary artery disease include diabetes mellitus, family history, hypertension and dyslipidemia. Current diabetic treatment includes insulin  injections and oral agent (dual therapy). Her weight is stable. She is following a generally unhealthy diet. When asked about meal planning, she reported none. She has not had a previous visit with a  dietitian. She rarely participates in exercise. Her home blood glucose trend is decreasing steadily. Her breakfast blood glucose range is generally 110-130 mg/dl. Her bedtime blood glucose range is generally 180-200 mg/dl. (She presents today with her meter showing at target fasting and slightly above target postprandial readings.  Her POCT A1c today is 7.5%, improving from last visit of 8.8%.  She denies any hypoglycemia.  She never heard from insurance about her Ozempic  but she did switch insurances at that time.  In the meantime, she was restarted on Farxiga  (by nephrology) to help prevent kidney decline.  She does note some UTI/yeast since restarting this.) An ACE inhibitor/angiotensin II receptor blocker is being taken. She does not see a podiatrist.Eye exam is current.     Review of systems  Constitutional: + Minimally fluctuating body weight, current Body mass index is 31.15 kg/m., no fatigue, no subjective hyperthermia, no subjective hypothermia Eyes: + blurry vision, no xerophthalmia ENT: no sore throat, no nodules palpated in throat, no dysphagia/odynophagia, no hoarseness Cardiovascular: no chest pain, no shortness of breath, no palpitations, no leg swelling Respiratory: no cough, no shortness of breath Gastrointestinal: no nausea/vomiting/diarrhea Musculoskeletal: no muscle/joint aches Skin: no rashes, no hyperemia Neurological: no tremors, no numbness, no tingling, no dizziness Psychiatric: no depression, no anxiety  Objective:     BP 122/70 (BP Location: Left Arm, Patient Position: Sitting, Cuff Size: Large)   Pulse 74   Ht 5\' 6"  (1.676 m)   Wt 193 lb (87.5 kg)   LMP 09/19/2016 (Approximate)   BMI 31.15 kg/m   Wt Readings from Last 3 Encounters:  02/29/24 193 lb (87.5 kg)  01/31/24 193 lb 9.6 oz (87.8 kg)  12/15/23 195 lb 1.9 oz (88.5 kg)     BP Readings from Last 3 Encounters:  02/29/24 122/70  01/31/24 138/80  12/15/23 135/85     Physical Exam-  Limited  Constitutional:  Body mass index is 31.15 kg/m. , not in acute distress, normal state of mind Eyes:  EOMI, no exophthalmos Musculoskeletal: no gross deformities, strength intact in all four extremities,  no gross restriction of joint movements Skin:  no rashes, no hyperemia Neurological: no tremor with outstretched hands   Diabetic Foot Exam - Simple   Simple Foot Form Visual Inspection No deformities, no ulcerations, no other skin breakdown bilaterally: Yes Sensation Testing Intact to touch and monofilament testing bilaterally: Yes Pulse Check Posterior Tibialis and Dorsalis pulse intact bilaterally: Yes Comments      CMP ( most recent) CMP     Component Value Date/Time   NA 142 12/15/2023 0928   K 3.9 12/15/2023 0928   CL 104 12/15/2023 0928   CO2 23 12/15/2023 0928   GLUCOSE 172 (H) 12/15/2023 0928   GLUCOSE 196 (H) 03/18/2023 1327   BUN 25 (H) 12/15/2023 0928   CREATININE 1.21 (H) 12/15/2023 0928   CALCIUM  9.6 12/15/2023 0928   PROT 7.2 04/19/2023 1450   PROT 6.8 12/21/2022 0907   ALBUMIN 4.2 12/21/2022 0907   AST 16 12/21/2022 0907   ALT 11 12/21/2022 0907   ALKPHOS 56 12/21/2022 0907   BILITOT 1.2 12/21/2022 0907   EGFR 53 (L) 12/15/2023 0928   GFRNONAA 48 (L) 03/18/2023 1327     Diabetic Labs (most recent): Lab Results  Component Value Date   HGBA1C 7.5 (A) 02/29/2024   HGBA1C 8.8 (A) 10/16/2023   HGBA1C 9.4 (H) 07/07/2023     Lipid Panel ( most recent) Lipid Panel     Component Value Date/Time   CHOL 163 12/15/2023 0928   TRIG 114 12/15/2023 0928   HDL 66 12/15/2023 0928   CHOLHDL 2.5 12/15/2023 0928   CHOLHDL 3.2 09/14/2021 0450   VLDL 20 09/14/2021 0450   LDLCALC 77 12/15/2023 0928   LABVLDL 20 12/15/2023 0928      Lab Results  Component Value Date   TSH 2.410 12/15/2023   TSH 1.13 04/19/2023   TSH 2.090 12/21/2022   FREET4 1.15 12/15/2023   FREET4 1.12 12/21/2022           Assessment & Plan:   1) Type 2 diabetes  mellitus with stage 3b chronic kidney disease, with long-term current use of insulin  (HCC) (Primary)  She presents today with her meter showing at target fasting and slightly above target postprandial readings.  Her POCT A1c today is 7.5%, improving from last visit of 8.8%.  She denies any hypoglycemia.  She never heard from insurance about her Ozempic  but she did switch insurances at that time.  In the meantime, she was restarted on Farxiga  (by nephrology) to help prevent kidney decline.  She does note some UTI/yeast since restarting this.  - Pete A Goga has currently uncontrolled symptomatic type 2 DM since 53 years of age.   -Recent labs reviewed.  - I had a long discussion with her about the progressive nature of diabetes and the pathology behind its complications. -her diabetes is complicated by CKD stage 3a, and retinopathy and she remains at a high risk for more acute and chronic complications which include CAD, CVA, CKD, retinopathy, and neuropathy. These are all discussed in detail with her.  The following Lifestyle Medicine recommendations according to American College of Lifestyle Medicine [facility]) were discussed and offered to patient and she agrees to start the journey:  A. Whole Foods, Plant-based plate comprising of fruits and vegetables, plant-based proteins, whole-grain carbohydrates was discussed in detail with the patient.   A list for source of those nutrients were also provided to the patient.  Patient will use only water  or unsweetened tea for hydration. B.  The  need to stay away from risky substances including alcohol, smoking; obtaining 7 to 9 hours of restorative sleep, at least 150 minutes of moderate intensity exercise weekly, the importance of healthy social connections,  and stress reduction techniques were discussed. C.  A full color page of  Calorie density of various food groups per pound showing examples of each food groups was provided to the patient.  -  Nutritional counseling repeated at each appointment due to patients tendency to fall back in to old habits.  - The patient admits there is a room for improvement in their diet and drink choices. -  Suggestion is made for the patient to avoid simple carbohydrates from their diet including Cakes, Sweet Desserts / Pastries, Ice Cream, Soda (diet and regular), Sweet Tea, Candies, Chips, Cookies, Sweet Pastries, Store Bought Juices, Alcohol in Excess of 1-2 drinks a day, Artificial Sweeteners, Coffee Creamer, and "Sugar-free" Products. This will help patient to have stable blood glucose profile and potentially avoid unintended weight gain.   - I encouraged the patient to switch to unprocessed or minimally processed complex starch and increased protein intake (animal or plant source), fruits, and vegetables.   - Patient is advised to stick to a routine mealtimes to eat 3 meals a day and avoid unnecessary snacks (to snack only to correct hypoglycemia).  - I have approached her with the following individualized plan to manage her diabetes and patient agrees:   -She is advised to continue Basaglar  12 units SQ nightly (has a desire to come off of this in the future).  Will retry sending in Ozempic  0.25 mg SQ weekly x 2 weeks, then increase to 0.5 mg SQ weekly (sample provided from office today).  She can continue Glipizide 5 mg po twice daily for now but if she experiences hypoglycemia, she is to let me know so we can reduce dose or stop it in the future.  -Due to recurrent yeast and UTI symptoms, will discontinue her Farxiga  today.  I know nephrology wanted her on this for renal protection, but Ozempic  may be a better choice for her due to side effect profile.  I advised her to finish out her current supply and then stop.  -she is encouraged to continue monitoring glucose 2 times daily, before breakfast and before bed, and to call the clinic if she has readings less than 70 or above 300 for 3 tests in a  row.  - she is warned not to take insulin  without proper monitoring per orders. - Adjustment parameters are given to her for hypo and hyperglycemia in writing.  - she is not a candidate for Metformin due to concurrent renal insufficiency.  - Specific targets for  A1c; LDL, HDL, and Triglycerides were discussed with the patient.  2) Blood Pressure /Hypertension:  her blood pressure is controlled to target.   she is advised to continue her current medications as prescribed by her PCP including ACE to protect kidneys from diabetes.  3) Lipids/Hyperlipidemia:    Review of her recent lipid panel from 07/07/23 showed controlled LDL at 86 .  she is advised to continue Crestor  20 mg daily at bedtime.  Side effects and precautions discussed with her.  4)  Weight/Diet:  her Body mass index is 31.15 kg/m.  -  clearly complicating her diabetes care.   she is a candidate for weight loss. I discussed with her the fact that loss of 5 - 10% of her  current body weight will have the most impact  on her diabetes management.  Exercise, and detailed carbohydrates information provided  -  detailed on discharge instructions.  5) Chronic Care/Health Maintenance: -she is on ACEI/ARB and Statin medications and is encouraged to initiate and continue to follow up with Ophthalmology, Dentist, Podiatrist at least yearly or according to recommendations, and advised to stay away from smoking. I have recommended yearly flu vaccine and pneumonia vaccine at least every 5 years; moderate intensity exercise for up to 150 minutes weekly; and sleep for at least 7 hours a day.  - she is advised to maintain close follow up with Del Abron Abt, FNP for primary care needs, as well as her other providers for optimal and coordinated care.     I spent  46  minutes in the care of the patient today including review of labs from CMP, Lipids, Thyroid Function, Hematology (current and previous including abstractions from other  facilities); face-to-face time discussing  her blood glucose readings/logs, discussing hypoglycemia and hyperglycemia episodes and symptoms, medications doses, her options of short and long term treatment based on the latest standards of care / guidelines;  discussion about incorporating lifestyle medicine;  and documenting the encounter. Risk reduction counseling performed per USPSTF guidelines to reduce obesity and cardiovascular risk factors.     Please refer to Patient Instructions for Blood Glucose Monitoring and Insulin /Medications Dosing Guide"  in media tab for additional information. Please  also refer to " Patient Self Inventory" in the Media  tab for reviewed elements of pertinent patient history.  Alexis Landry participated in the discussions, expressed understanding, and voiced agreement with the above plans.  All questions were answered to her satisfaction. she is encouraged to contact clinic should she have any questions or concerns prior to her return visit.     Follow up plan: - Return in about 4 months (around 07/01/2024) for Diabetes F/U with A1c in office, No previsit labs, Bring meter and logs.    Alexis Landry, [facility] [address] [place], [place] [address] Phone: [phone] Fax: [phone]  02/29/2024, 3:18 PM

## 2024-03-13 ENCOUNTER — Other Ambulatory Visit: Payer: Self-pay | Admitting: Family Medicine

## 2024-03-15 LAB — HM DIABETES EYE EXAM

## 2024-03-27 ENCOUNTER — Other Ambulatory Visit: Payer: Self-pay | Admitting: Family Medicine

## 2024-04-08 ENCOUNTER — Other Ambulatory Visit: Payer: Self-pay | Admitting: Family Medicine

## 2024-05-24 ENCOUNTER — Other Ambulatory Visit: Payer: Self-pay | Admitting: Family Medicine

## 2024-05-24 DIAGNOSIS — G2581 Restless legs syndrome: Secondary | ICD-10-CM

## 2024-06-11 ENCOUNTER — Encounter: Payer: Self-pay | Admitting: Nurse Practitioner

## 2024-06-11 NOTE — Telephone Encounter (Signed)
 Patient notes she is in need of PA for her Ozempic .

## 2024-06-12 ENCOUNTER — Other Ambulatory Visit (HOSPITAL_COMMUNITY): Payer: Self-pay

## 2024-06-12 ENCOUNTER — Telehealth: Payer: Self-pay

## 2024-06-12 NOTE — Telephone Encounter (Signed)
 Patient was called and a message was left on the patient's voicemail with this information.

## 2024-06-12 NOTE — Telephone Encounter (Signed)
 Talked with the patient. She received my message and was okay.

## 2024-06-12 NOTE — Telephone Encounter (Signed)
 Pharmacy Patient Advocate Encounter   Received notification from Patient Advice Request messages that prior authorization for Ozempic  (0.25 or 0.5mg /dose) 2mg /22ml is required/requested.   Insurance verification completed.   The patient is insured through [employer] .   Per test claim: The current 30 day co-pay is, $0.  No PA needed at this time. This test claim was processed through [facility]- copay amounts may vary at other pharmacies due to pharmacy/plan contracts, or as the patient moves through the different stages of their insurance plan.

## 2024-06-13 ENCOUNTER — Other Ambulatory Visit (HOSPITAL_COMMUNITY): Payer: Self-pay | Admitting: Family Medicine

## 2024-06-13 DIAGNOSIS — Z1231 Encounter for screening mammogram for malignant neoplasm of breast: Secondary | ICD-10-CM

## 2024-06-17 ENCOUNTER — Encounter (HOSPITAL_COMMUNITY): Payer: Self-pay

## 2024-06-17 ENCOUNTER — Ambulatory Visit (HOSPITAL_COMMUNITY)
Admission: RE | Admit: 2024-06-17 | Discharge: 2024-06-17 | Disposition: A | Discharge status: Home / Self Care | Source: Ambulatory Visit | Attending: Family Medicine | Admitting: Family Medicine

## 2024-06-17 DIAGNOSIS — Z1231 Encounter for screening mammogram for malignant neoplasm of breast: Secondary | ICD-10-CM | POA: Insufficient documentation

## 2024-06-19 ENCOUNTER — Telehealth (INDEPENDENT_AMBULATORY_CARE_PROVIDER_SITE_OTHER): Admitting: Family Medicine

## 2024-06-19 ENCOUNTER — Encounter: Payer: Self-pay | Admitting: Family Medicine

## 2024-06-19 VITALS — Ht 66.0 in | Wt 195.0 lb

## 2024-06-19 DIAGNOSIS — E782 Mixed hyperlipidemia: Secondary | ICD-10-CM

## 2024-06-19 DIAGNOSIS — G2581 Restless legs syndrome: Secondary | ICD-10-CM | POA: Diagnosis not present

## 2024-06-19 DIAGNOSIS — F5101 Primary insomnia: Secondary | ICD-10-CM | POA: Diagnosis not present

## 2024-06-19 DIAGNOSIS — I10 Essential (primary) hypertension: Secondary | ICD-10-CM | POA: Diagnosis not present

## 2024-06-19 MED ORDER — LOSARTAN POTASSIUM-HCTZ 100-25 MG PO TABS: 1.0000 | ORAL_TABLET | Freq: Every day | ORAL | 3 refills | Status: DC

## 2024-06-19 MED ORDER — GABAPENTIN 300 MG PO CAPS: 300.0000 mg | ORAL_CAPSULE | Freq: Two times a day (BID) | ORAL | 2 refills | Status: DC | PRN

## 2024-06-19 MED ORDER — AMLODIPINE BESYLATE 10 MG PO TABS: 10.0000 mg | ORAL_TABLET | Freq: Every day | ORAL | 3 refills | Status: AC

## 2024-06-19 MED ORDER — GLIPIZIDE 5 MG PO TABS: 5.0000 mg | ORAL_TABLET | Freq: Two times a day (BID) | ORAL | 3 refills | Status: DC

## 2024-06-19 MED ORDER — ALPRAZOLAM 0.5 MG PO TABS: 0.5000 mg | ORAL_TABLET | Freq: Every evening | ORAL | 0 refills | Status: AC | PRN

## 2024-06-19 NOTE — Assessment & Plan Note (Signed)
 Patient reports taking Crestor  40 mg once daily Previous LDL 77 Lipid panel ordered today to monitor trends.

## 2024-06-19 NOTE — Assessment & Plan Note (Signed)
 Continue Amlodipine  10 mg,  Clonidine  0.1 mg half a tablet, Losartan -hydrochlorothiazide  100-25 mg Patient recently has BMP labs ordered by Dr. Rachele Continued discussion on DASH diet, low sodium diet and maintain a exercise routine for 150 minutes per week.

## 2024-06-19 NOTE — Progress Notes (Signed)
 Virtual Visit via Video Note  I connected with Alexis Landry on 06/19/24 at  8:40 AM EDT by a video enabled telemedicine application and verified that I am speaking with the correct person using two identifiers.  Patient Location: Home Provider Location: Office/Clinic  I discussed the limitations, risks, security, and privacy concerns of performing an evaluation and management service by video and the availability of in person appointments. I also discussed with the patient that there may be a patient responsible charge related to this service. The patient expressed understanding and agreed to proceed.  Subjective: PCP: Terry Wilhelmena Lloyd Hilario, FNP  Chief Complaint  Patient presents with   Hypertension    6 month follow up    HPI Patient presents via telehealth for hypertension follow up. Patient reports being compliant with blood pressure medication. At home blood pressure readings are 139/68. Patient denies chest pain, SOB, headaches, blurred vision. For the details of today's visit, please refer to assessment and plan.  ROS: Per HPI  Current Outpatient Medications:    amitriptyline  (ELAVIL ) 10 MG tablet, TAKE 1 TABLET BY MOUTH EVERYDAY AT BEDTIME, Disp: 30 tablet, Rfl: 0   Blood Glucose Monitoring Suppl (ONE TOUCH ULTRA 2) w/Device KIT, USE IN THE MORNING, AT NOON, AND AT BEDTIME, Disp: 1 kit, Rfl: 3   cloNIDine  HCl (KAPVAY ) 0.1 MG TB12 ER tablet, Take 1/2 Tablet Daily at night, Disp: 45 tablet, Rfl: 3   dorzolamide (TRUSOPT) 2 % ophthalmic solution, 1 drop 2 (two) times daily., Disp: , Rfl:    glucose blood (ONETOUCH ULTRA) test strip, Test blood sugar three times per day dx e11.65, Disp: 100 strip, Rfl: 2   Insulin  Glargine (BASAGLAR  KWIKPEN) 100 UNIT/ML, INJECT 12 UNITS INTO THE SKIN AT BEDTIME. (ACTUAL DAYS SUPPLY IS 125 DAYS), Disp: 45 mL, Rfl: 1   Insulin  Pen Needle 30G X 5 MM MISC, Used as Directed, Disp: 100 each, Rfl: 1   Lancets 30G MISC, 1 EACH BY DOES NOT APPLY  ROUTE IN THE MORNING, AT NOON, AND AT BEDTIME. MAY SUBSTITUTE TO ANY MANUFACTURER COVERED BY PATIENT'S INSURANCE., Disp: 100 each, Rfl: 0   ofloxacin (OCUFLOX) 0.3 % ophthalmic solution, Place 1 drop into both eyes every 4 (four) hours., Disp: , Rfl:    pantoprazole  (PROTONIX ) 40 MG tablet, Take 1 tablet (40 mg total) by mouth daily., Disp: 90 tablet, Rfl: 1   rosuvastatin  (CRESTOR ) 40 MG tablet, Take 1 tablet (40 mg total) by mouth daily., Disp: 90 tablet, Rfl: 3   Semaglutide ,0.25 or 0.5MG /DOS, (OZEMPIC , 0.25 OR 0.5 MG/DOSE,) 2 MG/3ML SOPN, Inject 0.5 mg into the skin once a week., Disp: 3 mL, Rfl: 1   timolol (TIMOPTIC) 0.5 % ophthalmic solution, 1 drop 2 (two) times daily., Disp: , Rfl:    Vitamin D , Ergocalciferol , (DRISDOL) 1.25 MG (50000 UNIT) CAPS capsule, Take 50,000 Units by mouth once a week., Disp: , Rfl:    ALPRAZolam  (XANAX ) 0.5 MG tablet, Take 1 tablet (0.5 mg total) by mouth at bedtime as needed for anxiety., Disp: 20 tablet, Rfl: 0   amLODipine  (NORVASC ) 10 MG tablet, Take 1 tablet (10 mg total) by mouth daily., Disp: 30 tablet, Rfl: 3   gabapentin  (NEURONTIN ) 300 MG capsule, Take 1 capsule (300 mg total) by mouth 2 (two) times daily as needed., Disp: 60 capsule, Rfl: 2   glipiZIDE  (GLUCOTROL ) 5 MG tablet, Take 1 tablet (5 mg total) by mouth 2 (two) times daily., Disp: 60 tablet, Rfl: 3   losartan -hydrochlorothiazide  (HYZAAR)  100-25 MG tablet, Take 1 tablet by mouth daily., Disp: 30 tablet, Rfl: 3  Observations/Objective: Today's Vitals   06/19/24 0841  Weight: 195 lb (88.5 kg)  Height: 5' 6 (1.676 m)   Physical Exam Patient is alert and no acute distress noted.   Assessment and Plan: Mixed hyperlipidemia Assessment & Plan: Patient reports taking Crestor  40 mg once daily Previous LDL 77 Lipid panel ordered today to monitor trends.  Orders: -     Lipid panel -     CBC with Differential/Platelet  Primary insomnia -     ALPRAZolam ; Take 1 tablet (0.5 mg total) by mouth  at bedtime as needed for anxiety.  Dispense: 20 tablet; Refill: 0  Restless leg syndrome -     Gabapentin ; Take 1 capsule (300 mg total) by mouth 2 (two) times daily as needed.  Dispense: 60 capsule; Refill: 2  Primary hypertension Assessment & Plan: Continue Amlodipine  10 mg,  Clonidine  0.1 mg half a tablet, Losartan -hydrochlorothiazide  100-25 mg Patient recently has BMP labs ordered by Dr. Rachele Continued discussion on DASH diet, low sodium diet and maintain a exercise routine for 150 minutes per week.    Other orders -     amLODIPine  Besylate; Take 1 tablet (10 mg total) by mouth daily.  Dispense: 30 tablet; Refill: 3 -     glipiZIDE ; Take 1 tablet (5 mg total) by mouth 2 (two) times daily.  Dispense: 60 tablet; Refill: 3 -     Losartan  Potassium-HCTZ; Take 1 tablet by mouth daily.  Dispense: 30 tablet; Refill: 3    Follow Up Instructions: No follow-ups on file.   I discussed the assessment and treatment plan with the patient. The patient was provided an opportunity to ask questions, and all were answered. The patient agreed with the plan and demonstrated an understanding of the instructions.   The patient was advised to call back or seek an in-person evaluation if the symptoms worsen or if the condition fails to improve as anticipated.  The above assessment and management plan was discussed with the patient. The patient verbalized understanding of and has agreed to the management plan.   Hilario Kidd Wilhelmena Falter, FNP

## 2024-07-01 ENCOUNTER — Encounter: Payer: Self-pay | Admitting: Nurse Practitioner

## 2024-07-01 ENCOUNTER — Ambulatory Visit: Admitting: Nurse Practitioner

## 2024-07-01 VITALS — BP 112/78 | HR 71 | Ht 66.0 in | Wt 199.2 lb

## 2024-07-01 DIAGNOSIS — I10 Essential (primary) hypertension: Secondary | ICD-10-CM

## 2024-07-01 DIAGNOSIS — N1832 Chronic kidney disease, stage 3b: Secondary | ICD-10-CM

## 2024-07-01 DIAGNOSIS — E1122 Type 2 diabetes mellitus with diabetic chronic kidney disease: Secondary | ICD-10-CM | POA: Diagnosis not present

## 2024-07-01 DIAGNOSIS — Z7985 Long-term (current) use of injectable non-insulin antidiabetic drugs: Secondary | ICD-10-CM

## 2024-07-01 DIAGNOSIS — Z7984 Long term (current) use of oral hypoglycemic drugs: Secondary | ICD-10-CM

## 2024-07-01 DIAGNOSIS — E559 Vitamin D deficiency, unspecified: Secondary | ICD-10-CM

## 2024-07-01 DIAGNOSIS — Z794 Long term (current) use of insulin: Secondary | ICD-10-CM

## 2024-07-01 DIAGNOSIS — E782 Mixed hyperlipidemia: Secondary | ICD-10-CM

## 2024-07-01 LAB — POCT GLYCOSYLATED HEMOGLOBIN (HGB A1C): Hemoglobin A1C: 8.9 % — AB (ref 4.0–5.6)

## 2024-07-01 LAB — POCT UA - MICROALBUMIN

## 2024-07-01 MED ORDER — ONETOUCH ULTRASOFT LANCETS MISC: 12 refills | Status: AC

## 2024-07-01 NOTE — Progress Notes (Signed)
 Endocrinology Follow Up Note       07/01/2024, 3:43 PM   Subjective:    Patient ID: Alexis Landry, female    DOB: [birth year].  Alexis Landry is being seen in follow up after being seen in consultation for management of currently uncontrolled symptomatic diabetes requested by  Del Wilhelmena Lloyd Sola, FNP.   Past Medical History:  Diagnosis Date   Diabetic neuropathy (HCC)    DM (diabetes mellitus), type 2, uncontrolled    Epiretinal membrane, right    GERD (gastroesophageal reflux disease)    HTN (hypertension)    Hyperlipidemia    Macular edema, diabetic (HCC)    Rheumatoid arthritis (HCC)    Vitamin D  deficiency     Past Surgical History:  Procedure Laterality Date   COLONOSCOPY N/A 01/04/2017   Procedure: COLONOSCOPY;  Surgeon: Margo LITTIE Haddock, MD;  Location: [facility];  Service: Endoscopy;  Laterality: N/A;  10:30am   KENALOG  INJECTION Right 12/03/2018   Procedure: LUCENTIS  0.3MG  INJECTION;  Surgeon: Jarold Mayo, MD;  Location: [facility] OR;  Service: Ophthalmology;  Laterality: Right;   LEFT HEART CATH AND CORONARY ANGIOGRAPHY N/A 09/14/2021   Procedure: LEFT HEART CATH AND CORONARY ANGIOGRAPHY;  Surgeon: Burnard Debby DELENA, MD;  Location: [facility];  Service: Cardiovascular;  Laterality: N/A;   MEMBRANE PEEL Right 12/03/2018   Procedure: MEMBRANE PEEL;  Surgeon: Jarold Mayo, MD;  Location: [facility] OR;  Service: Ophthalmology;  Laterality: Right;   PARS PLANA VITRECTOMY Right 12/03/2018   Procedure: PARS PLANA VITRECTOMY WITH 25 GAUGE;  Surgeon: Jarold Mayo, MD;  Location: [facility] OR;  Service: Ophthalmology;  Laterality: Right;   PHOTOCOAGULATION WITH LASER Right 12/03/2018   Procedure: PHOTOCOAGULATION WITH LASER;  Surgeon: Jarold Mayo, MD;  Location: [facility] OR;  Service: Ophthalmology;  Laterality: Right;   TUBAL LIGATION      Social History   Socioeconomic History   Marital status: Married     Spouse name: Not on file   Number of children: Not on file   Years of education: Not on file   Highest education level: Not on file  Occupational History   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 Right    Are you currently employed ? 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 alone   Who lives with you? na   What type of home do you live in: 1 story or 2 story? 2 story    Social Drivers of Corporate investment banker Strain: Low Risk  (09/01/2022)   Overall Financial Resource Strain (CARDIA)    Difficulty  of Paying Living Expenses: Not hard at all  Food Insecurity: No Food Insecurity (09/01/2022)   Hunger Vital Sign    Worried About Running Out of Food in the Last Year: Never true    Ran Out of Food in the Last Year: Never true  Transportation Needs: No Transportation Needs (09/01/2022)   PRAPARE - Administrator, Civil Service (Medical): No    Lack of Transportation (Non-Medical): No  Physical Activity: Insufficiently Active (09/01/2022)   Exercise Vital Sign    Days of Exercise per Week: 2 days    Minutes of Exercise per Session: 20 min  Stress: No Stress Concern Present (09/01/2022)   [employer] of Occupational Health - Occupational Stress Questionnaire    Feeling of Stress : Only a little  Social Connections: Moderately Isolated (09/01/2022)   Social Connection and Isolation Panel    Frequency of Communication with Friends and Family: More than three times a week     Frequency of Social Gatherings with Friends and Family: Three times a week    Attends Religious Services: 1 to 4 times per year    Active Member of Clubs or Organizations: No    Attends Banker Meetings: Never    Marital Status: Separated    Family History  Problem Relation Age of Onset   Diabetes Paternal Grandfather    Diabetes Paternal Grandmother    Diabetes Maternal Grandfather    Kidney disease Father    Heart disease Mother    Colon cancer Neg Hx     Outpatient Encounter Medications as of 07/01/2024  Medication Sig   ALPRAZolam  (XANAX ) 0.5 MG tablet Take 1 tablet (0.5 mg total) by mouth at bedtime as needed for anxiety.   amLODipine  (NORVASC ) 10 MG tablet Take 1 tablet (10 mg total) by mouth daily.   Blood Glucose Monitoring Suppl (ONE TOUCH ULTRA 2) w/Device KIT USE IN THE MORNING, AT NOON, AND AT BEDTIME   cloNIDine  HCl (KAPVAY ) 0.1 MG TB12 ER tablet Take 1/2 Tablet Daily at night   dorzolamide (TRUSOPT) 2 % ophthalmic solution 1 drop 2 (two) times daily.   gabapentin  (NEURONTIN ) 300 MG capsule Take 1 capsule (300 mg total) by mouth 2 (two) times daily as needed.   glipiZIDE  (GLUCOTROL ) 5 MG tablet Take 1 tablet (5 mg total) by mouth 2 (two) times daily.   glucose blood (ONETOUCH ULTRA) test strip Test blood sugar three times per day dx e11.65   Insulin  Glargine (BASAGLAR  KWIKPEN) 100 UNIT/ML INJECT 12 UNITS INTO THE SKIN AT BEDTIME. (ACTUAL DAYS SUPPLY IS 125 DAYS)   Insulin  Pen Needle 30G X 5 MM MISC Used as Directed   Lancets (ONETOUCH ULTRASOFT) lancets Use as instructed   losartan -hydrochlorothiazide  (HYZAAR) 100-25 MG tablet Take 1 tablet by mouth daily.   ofloxacin (OCUFLOX) 0.3 % ophthalmic solution Place 1 drop into both eyes every 4 (four) hours.   pantoprazole  (PROTONIX ) 40 MG tablet Take 1 tablet (40 mg total) by mouth daily.   rosuvastatin  (CRESTOR ) 40 MG tablet Take 1 tablet (40 mg total) by mouth daily.   Semaglutide ,0.25 or 0.5MG /DOS,  (OZEMPIC , 0.25 OR 0.5 MG/DOSE,) 2 MG/3ML SOPN Inject 0.5 mg into the skin once a week.   timolol (TIMOPTIC) 0.5 % ophthalmic solution 1 drop 2 (two) times daily.   Vitamin D , Ergocalciferol , (DRISDOL) 1.25 MG (50000 UNIT) CAPS capsule Take 50,000 Units by mouth once a week.   [DISCONTINUED] Lancets 30G MISC 1 EACH BY DOES NOT APPLY ROUTE IN  THE MORNING, AT NOON, AND AT BEDTIME. MAY SUBSTITUTE TO ANY MANUFACTURER COVERED BY PATIENT'S INSURANCE.   amitriptyline  (ELAVIL ) 10 MG tablet TAKE 1 TABLET BY MOUTH EVERYDAY AT BEDTIME (Patient not taking: Reported on 07/01/2024)   No facility-administered encounter medications on file as of 07/01/2024.    ALLERGIES: Allergies  Allergen Reactions   Lisinopril Anaphylaxis    Throat swelling    Sulfa Antibiotics Itching    VACCINATION STATUS: Immunization History  Administered Date(s) Administered   Hepatitis B, ADULT 06/04/2019   Influenza, Mdck, Trivalent,PF 6+ MOS(egg free) 07/17/2023   Influenza,inj,Quad PF,6+ Mos 06/04/2019, 09/14/2021   Pneumococcal Polysaccharide-23 06/04/2019   Tdap 06/04/2019    Diabetes She presents for her follow-up diabetic visit. She has type 2 diabetes mellitus. Onset time: diagnosed at age 40. Her disease course has been fluctuating. There are no hypoglycemic associated symptoms. Associated symptoms include blurred vision (hx retinopathy with injections), fatigue and polyuria. There are no hypoglycemic complications. Symptoms are improving. Diabetic complications include nephropathy and retinopathy. Risk factors for coronary artery disease include diabetes mellitus, family history, hypertension and dyslipidemia. Current diabetic treatment includes insulin  injections and oral agent (dual therapy). Her weight is stable. She is following a generally unhealthy diet. When asked about meal planning, she reported none. She has not had a previous visit with a dietitian. She rarely participates in exercise. Her home blood glucose  trend is fluctuating minimally. Her breakfast blood glucose range is generally 110-130 mg/dl. Her bedtime blood glucose range is generally 180-200 mg/dl. (She presents today with her meter showing at fluctuating glycemic profile overall.  Her POCT A1c today is 8.9%, increasing from last visit of 7.5%.  She had 1 episode of hypoglycemia but attributes this to not eating that day.  She has had complete resolution of GU symptoms after stopping Farxiga .  She initially started the Ozempic  and did well with it but got scared after seeing lawsuits on social media about causing blindness.  Analysis of her meter shows 7-day average of 162, 14-day average of 137, 30-day average of 157.) An ACE inhibitor/angiotensin II receptor blocker is being taken. She does not see a podiatrist.Eye exam is current.     Review of systems  Constitutional: + Minimally fluctuating body weight, current Body mass index is 32.15 kg/m., no fatigue, no subjective hyperthermia, no subjective hypothermia Eyes: + blurry vision, no xerophthalmia ENT: no sore throat, no nodules palpated in throat, no dysphagia/odynophagia, no hoarseness Cardiovascular: no chest pain, no shortness of breath, no palpitations, no leg swelling Respiratory: no cough, no shortness of breath Gastrointestinal: no nausea/vomiting/diarrhea Musculoskeletal: no muscle/joint aches Skin: no rashes, no hyperemia Neurological: no tremors, no numbness, no tingling, no dizziness Psychiatric: no depression, no anxiety  Objective:     BP 112/78 (BP Location: Left Arm, Patient Position: Sitting, Cuff Size: Large)   Pulse 71   Ht 5' 6 (1.676 m)   Wt 199 lb 3.2 oz (90.4 kg)   LMP 09/19/2016 (Approximate)   BMI 32.15 kg/m   Wt Readings from Last 3 Encounters:  07/01/24 199 lb 3.2 oz (90.4 kg)  06/19/24 195 lb (88.5 kg)  02/29/24 193 lb (87.5 kg)     BP Readings from Last 3 Encounters:  07/01/24 112/78  02/29/24 122/70  01/31/24 138/80     Physical Exam-  Limited  Constitutional:  Body mass index is 32.15 kg/m. , not in acute distress, normal state of mind Eyes:  EOMI, no exophthalmos Musculoskeletal: no gross deformities, strength intact in all four  extremities, no gross restriction of joint movements Skin:  no rashes, no hyperemia Neurological: no tremor with outstretched hands   Diabetic Foot Exam - Simple   Simple Foot Form Diabetic Foot exam was performed with the following findings: Yes 07/01/2024  3:26 PM  Visual Inspection No deformities, no ulcerations, no other skin breakdown bilaterally: Yes Sensation Testing Intact to touch and monofilament testing bilaterally: Yes Pulse Check Posterior Tibialis and Dorsalis pulse intact bilaterally: Yes Comments      CMP ( most recent) CMP     Component Value Date/Time   NA 142 12/15/2023 0928   K 3.9 12/15/2023 0928   CL 104 12/15/2023 0928   CO2 23 12/15/2023 0928   GLUCOSE 172 (H) 12/15/2023 0928   GLUCOSE 196 (H) 03/18/2023 1327   BUN 25 (H) 12/15/2023 0928   CREATININE 1.21 (H) 12/15/2023 0928   CALCIUM  9.6 12/15/2023 0928   PROT 7.2 04/19/2023 1450   PROT 6.8 12/21/2022 0907   ALBUMIN 4.2 12/21/2022 0907   AST 16 12/21/2022 0907   ALT 11 12/21/2022 0907   ALKPHOS 56 12/21/2022 0907   BILITOT 1.2 12/21/2022 0907   EGFR 53 (L) 12/15/2023 0928   GFRNONAA 48 (L) 03/18/2023 1327     Diabetic Labs (most recent): Lab Results  Component Value Date   HGBA1C 8.9 (A) 07/01/2024   HGBA1C 7.5 (A) 02/29/2024   HGBA1C 8.8 (A) 10/16/2023   MICROALBUR 150mg /L 07/01/2024     Lipid Panel ( most recent) Lipid Panel     Component Value Date/Time   CHOL 163 12/15/2023 0928   TRIG 114 12/15/2023 0928   HDL 66 12/15/2023 0928   CHOLHDL 2.5 12/15/2023 0928   CHOLHDL 3.2 09/14/2021 0450   VLDL 20 09/14/2021 0450   LDLCALC 77 12/15/2023 0928   LABVLDL 20 12/15/2023 0928      Lab Results  Component Value Date   TSH 2.410 12/15/2023   TSH 1.13 04/19/2023   TSH 2.090  12/21/2022   FREET4 1.15 12/15/2023   FREET4 1.12 12/21/2022           Assessment & Plan:   1) Type 2 diabetes mellitus with stage 3b chronic kidney disease, with long-term current use of insulin  (HCC) (Primary)  She presents today with her meter showing at fluctuating glycemic profile overall.  Her POCT A1c today is 8.9%, increasing from last visit of 7.5%.  She had 1 episode of hypoglycemia but attributes this to not eating that day.  She has had complete resolution of GU symptoms after stopping Farxiga .  She initially started the Ozempic  and did well with it but got scared after seeing lawsuits on social media about causing blindness.  Analysis of her meter shows 7-day average of 162, 14-day average of 137, 30-day average of 157.  - Alexis Landry has currently uncontrolled symptomatic type 2 DM since 53 years of age.   -Recent labs reviewed.  - I had a long discussion with her about the progressive nature of diabetes and the pathology behind its complications. -her diabetes is complicated by CKD stage 3a, and retinopathy and she remains at a high risk for more acute and chronic complications which include CAD, CVA, CKD, retinopathy, and neuropathy. These are all discussed in detail with her.  The following Lifestyle Medicine recommendations according to American College of Lifestyle Medicine [facility]) were discussed and offered to patient and she agrees to start the journey:  A. Whole Foods, Plant-based plate comprising of fruits and vegetables, plant-based proteins,  whole-grain carbohydrates was discussed in detail with the patient.   A list for source of those nutrients were also provided to the patient.  Patient will use only water  or unsweetened tea for hydration. B.  The need to stay away from risky substances including alcohol, smoking; obtaining 7 to 9 hours of restorative sleep, at least 150 minutes of moderate intensity exercise weekly, the importance of healthy social  connections,  and stress reduction techniques were discussed. C.  A full color page of  Calorie density of various food groups per pound showing examples of each food groups was provided to the patient.  - Nutritional counseling repeated at each appointment due to patients tendency to fall back in to old habits.  - The patient admits there is a room for improvement in their diet and drink choices. -  Suggestion is made for the patient to avoid simple carbohydrates from their diet including Cakes, Sweet Desserts / Pastries, Ice Cream, Soda (diet and regular), Sweet Tea, Candies, Chips, Cookies, Sweet Pastries, Store Bought Juices, Alcohol in Excess of 1-2 drinks a day, Artificial Sweeteners, Coffee Creamer, and Sugar-free Products. This will help patient to have stable blood glucose profile and potentially avoid unintended weight gain.   - I encouraged the patient to switch to unprocessed or minimally processed complex starch and increased protein intake (animal or plant source), fruits, and vegetables.   - Patient is advised to stick to a routine mealtimes to eat 3 meals a day and avoid unnecessary snacks (to snack only to correct hypoglycemia).  - I have approached her with the following individualized plan to manage her diabetes and patient agrees:   -She is advised to continue Basaglar  12 units SQ nightly and Glipizide  5 mg po twice daily for now but if she experiences hypoglycemia, she is to let me know so we can reduce dose or stop it in the future.  I instructed her to restart the Ozempic  0.25 mg SQ weekly x 2 weeks, then increase to 0.5 mg SQ weekly thereafter.  We did discuss risks and benefits of this medication with retinopathy.  -Due to recurrent yeast and UTI symptoms, Farxiga  was discontinued and she reports complete resolution of her GU symptoms after stopping.    -she is encouraged to continue monitoring glucose 2 times daily, before breakfast and before bed, and to call the clinic  if she has readings less than 70 or above 300 for 3 tests in a row.  - she is warned not to take insulin  without proper monitoring per orders. - Adjustment parameters are given to her for hypo and hyperglycemia in writing.  - she is not a candidate for Metformin due to concurrent renal insufficiency.  - Specific targets for  A1c; LDL, HDL, and Triglycerides were discussed with the patient.  2) Blood Pressure /Hypertension:  her blood pressure is controlled to target.   she is advised to continue her current medications as prescribed by her PCP including ACE to protect kidneys from diabetes.  3) Lipids/Hyperlipidemia:    Review of her recent lipid panel from 07/07/23 showed controlled LDL at 86 .  she is advised to continue Crestor  20 mg daily at bedtime.  Side effects and precautions discussed with her.  4)  Weight/Diet:  her Body mass index is 32.15 kg/m.  -  clearly complicating her diabetes care.   she is a candidate for weight loss. I discussed with her the fact that loss of 5 - 10% of her  current  body weight will have the most impact on her diabetes management.  Exercise, and detailed carbohydrates information provided  -  detailed on discharge instructions.  5) Chronic Care/Health Maintenance: -she is on ACEI/ARB and Statin medications and is encouraged to initiate and continue to follow up with Ophthalmology, Dentist, Podiatrist at least yearly or according to recommendations, and advised to stay away from smoking. I have recommended yearly flu vaccine and pneumonia vaccine at least every 5 years; moderate intensity exercise for up to 150 minutes weekly; and sleep for at least 7 hours a day.  - she is advised to maintain close follow up with Del Wilhelmena Lloyd Sola, FNP for primary care needs, as well as her other providers for optimal and coordinated care.     I spent  53  minutes in the care of the patient today including review of labs from CMP, Lipids, Thyroid Function,  Hematology (current and previous including abstractions from other facilities); face-to-face time discussing  her blood glucose readings/logs, discussing hypoglycemia and hyperglycemia episodes and symptoms, medications doses, her options of short and long term treatment based on the latest standards of care / guidelines;  discussion about incorporating lifestyle medicine;  and documenting the encounter. Risk reduction counseling performed per USPSTF guidelines to reduce obesity and cardiovascular risk factors.     Please refer to Patient Instructions for Blood Glucose Monitoring and Insulin /Medications Dosing Guide  in media tab for additional information. Please  also refer to  Patient Self Inventory in the Media  tab for reviewed elements of pertinent patient history.  Alexis Landry participated in the discussions, expressed understanding, and voiced agreement with the above plans.  All questions were answered to her satisfaction. she is encouraged to contact clinic should she have any questions or concerns prior to her return visit.     Follow up plan: - Return in about 4 months (around 10/31/2024) for Diabetes F/U with A1c in office, No previsit labs, Bring meter and logs.   Benton Rio, [facility] [address] [place], [place] [address] Phone: [phone] Fax: [phone]  07/01/2024, 3:43 PM

## 2024-07-18 ENCOUNTER — Other Ambulatory Visit: Payer: Self-pay | Admitting: Orthopedic Surgery

## 2024-07-18 DIAGNOSIS — G5601 Carpal tunnel syndrome, right upper limb: Secondary | ICD-10-CM

## 2024-07-30 ENCOUNTER — Ambulatory Visit: Admitting: Adult Health

## 2024-07-30 ENCOUNTER — Other Ambulatory Visit (HOSPITAL_COMMUNITY)
Admission: RE | Admit: 2024-07-30 | Discharge: 2024-07-30 | Disposition: A | Discharge status: Home / Self Care | Source: Ambulatory Visit | Attending: Adult Health | Admitting: Adult Health

## 2024-07-30 ENCOUNTER — Encounter: Payer: Self-pay | Admitting: Adult Health

## 2024-07-30 VITALS — BP 129/84 | HR 74 | Ht 66.0 in | Wt 195.0 lb

## 2024-07-30 DIAGNOSIS — A599 Trichomoniasis, unspecified: Secondary | ICD-10-CM

## 2024-07-30 DIAGNOSIS — N898 Other specified noninflammatory disorders of vagina: Secondary | ICD-10-CM | POA: Diagnosis present

## 2024-07-30 DIAGNOSIS — R102 Pelvic and perineal pain unspecified side: Secondary | ICD-10-CM | POA: Insufficient documentation

## 2024-07-30 LAB — POCT WET PREP (WET MOUNT)
Clue Cells Wet Prep Whiff POC: POSITIVE
WBC, Wet Prep HPF POC: POSITIVE

## 2024-07-30 MED ORDER — METRONIDAZOLE 500 MG PO TABS: 500.0000 mg | ORAL_TABLET | Freq: Two times a day (BID) | ORAL | 0 refills | Status: DC

## 2024-07-30 NOTE — Progress Notes (Signed)
  Subjective:     Patient ID: Alexis Landry, female   DOB: 04/02/1971, 53 y.o.   MRN: [id number]  HPI Larkin is a 53 year old black female,separated, PM in complaining of low abdominal pain for about a week and heavy discharge with odor for about 3 days. The last time it felt like this had trich.  Last pap was negative in 2023 she says.  PCP is I Polanco NP Review of Systems +low abdominal pain for a week +heavy vaginal discharge with odor for about 3 days Denies any itching or burning Has had some nausea and vomiting No new sex partners Reviewed past medical,surgical, social and family history. Reviewed medications and allergies.     Objective:   Physical Exam BP 129/84 (BP Location: Left Arm, Patient Position: Sitting, Cuff Size: Normal)   Pulse 74   Ht 5' 6 (1.676 m)   Wt 195 lb (88.5 kg)   LMP 09/19/2016 (Approximate)   BMI 31.47 kg/m     Skin warm and dry.Pelvic: external genitalia is normal in appearance no lesions, vagina: frothy discharge with odor,urethra has no lesions or masses noted, cervix:smooth and bulbous,no CMT, uterus: normal size, shape and contour, mildly tender, no masses felt, adnexa: no masses or tenderness noted. Bladder is non tender and no masses felt. Wet prep: + for clue cells and +WBCs and trich CV swab obtained. Fall risk is low  [facility]stream - 07/30/24 0927       Pregnancy Intention Screening   Does the patient want to become pregnant in the next year? N/A    Does the patient's partner want to become pregnant in the next year? N/A    Would the patient like to discuss contraceptive options today? N/A      Contraception Wrap Up   Current Method Female Sterilization   PM   End Method Female Sterilization   PM   Contraception Counseling Provided No         Examination chaperoned by Clarita Salt LPN  Assessment:     1. Vaginal discharge +frothy discharge CV swab sent for GC/CHL,trich,BV and yeast  - Cervicovaginal ancillary only( [facility]) - POCT Wet Prep [employer])  2. Vaginal odor +odor - Cervicovaginal ancillary only( [facility]) - POCT Wet Prep [employer] [facility])  3. Trichomonas infection +trich on wet prep Will rx flagyl  500 mg 1 bid x 7 days no alcohol or sex while taking meds Meds ordered this encounter  Medications   metroNIDAZOLE  (FLAGYL ) 500 MG tablet    Sig: Take 1 tablet (500 mg total) by mouth 2 (two) times daily.    Dispense:  14 tablet    Refill:  0    Supervising Provider:   JAYNE VONN DEL [[id number]]   Gave her rx for partner Renelda Louder [birth year] for flagyl  500 mg # 4 4 po now no alochol  - POCT Wet Prep [facility])  4. Pelvic pain (Primary) +pain for bout da week, had last time had trich     Plan:     Follow up in 2 weeks for proof of cure and to assess pelvic pain

## 2024-07-31 ENCOUNTER — Ambulatory Visit: Payer: Self-pay | Admitting: Adult Health

## 2024-07-31 ENCOUNTER — Ambulatory Visit: Admitting: Student

## 2024-07-31 LAB — CERVICOVAGINAL ANCILLARY ONLY
Bacterial Vaginitis (gardnerella): POSITIVE — AB
Candida Glabrata: NEGATIVE
Candida Vaginitis: NEGATIVE
Chlamydia: NEGATIVE
Comment: NEGATIVE
Comment: NEGATIVE
Comment: NEGATIVE
Comment: NEGATIVE
Comment: NEGATIVE
Comment: NORMAL
Neisseria Gonorrhea: NEGATIVE
Trichomonas: POSITIVE — AB

## 2024-07-31 NOTE — Progress Notes (Deleted)
 Cardiology Office Note    Date:  07/31/2024  ID:  Alexis Landry, DOB 26-Feb-1971, MRN [id number] Cardiologist: Vina Gull, MD { :  History of Present Illness:    Alexis Landry is a 53 y.o. female with past medical history of CAD (cardiac catheterization in 09/2021 showing 30% distal RCA stenosis and nonobstructive disease), HTN, HLD and type II DM who presents to the office today for 1-month follow-up.  She was last examined by Dena Kapur, PA in 01/2024 and reported having dizziness and fatigue which was typically most notable when changing positions.  Denied any anginal symptoms.  She did have upcoming follow-up with neurology and was encouraged to keep this visit.  LDL have been elevated to 77 by recent labs and Crestor  was titrated from 20 mg daily to 40 mg daily.  - FLP, CMET  ROS: ***  Studies Reviewed:   EKG: EKG is*** ordered today and demonstrates ***   EKG Interpretation Date/Time:    Ventricular Rate:    PR Interval:    QRS Duration:    QT Interval:    QTC Calculation:   R Axis:      Text Interpretation:         Cardiac Catheterization: 09/2021   Dist RCA lesion is 30% stenosed.   The left ventricular systolic function is normal.   LV end diastolic pressure is normal.   The left ventricular ejection fraction is 55-65% by visual estimate.   No significant coronary obstructive disease with mild smooth  30% narrowing in the distal RCA proximal to the PDA takeoff.  The LAD and circumflex and the remainder of the the RCA appeared angiographically normal.   Normal LV function with EF estimated at 55 to 65% without focal segmental wall motion abnormalities.  LVEDP 12 mmHg.   RECOMMENDATION: Low-dose aspirin  therapy.  Medical therapy for mild nonobstructive CAD in this patient with hypertension and hyperlipidemia.  Aim for target LDL less than 70.    Echocardiogram: 09/2021 IMPRESSIONS     1. Left ventricular ejection fraction, by estimation, is 65  to 70%. The  left ventricle has normal function. The left ventricle has no regional  wall motion abnormalities. There is moderate concentric left ventricular  hypertrophy. Left ventricular  diastolic parameters are indeterminate. Elevated left ventricular  end-diastolic pressure.   2. Right ventricular systolic function is normal. The right ventricular  size is normal. Tricuspid regurgitation signal is inadequate for assessing  PA pressure.   3. Left atrial size was upper normal.   4. A small pericardial effusion is present. The pericardial effusion is  posterior to the left ventricle and localized near the right atrium.   5. The mitral valve is grossly normal. Mild mitral valve regurgitation.   6. The aortic valve is tricuspid. Aortic valve regurgitation is not  visualized.   7. The inferior vena cava is normal in size with greater than 50%  respiratory variability, suggesting right atrial pressure of 3 mmHg.    Carotid Dopplers: 05/2022 IMPRESSION: Color duplex indicates minimal heterogeneous plaque, with no hemodynamically significant stenosis by duplex criteria in the extracranial cerebrovascular circulation.  Risk Assessment/Calculations:   {Does this patient have ATRIAL FIBRILLATION?:[phone]}           Physical Exam:   VS:  LMP 09/19/2016 (Approximate)    Wt Readings from Last 3 Encounters:  07/30/24 195 lb (88.5 kg)  07/01/24 199 lb 3.2 oz (90.4 kg)  06/19/24 195 lb (88.5 kg)  GEN: Well nourished, well developed in no acute distress NECK: No JVD; No carotid bruits CARDIAC: ***RRR, no murmurs, rubs, gallops RESPIRATORY:  Clear to auscultation without rales, wheezing or rhonchi  ABDOMEN: Appears non-distended. No obvious abdominal masses. EXTREMITIES: No clubbing or cyanosis. No edema.  Distal pedal pulses are 2+ bilaterally.   Assessment and Plan:   1. Coronary artery disease involving native coronary artery of native heart without angina  pectoris ***  2. Essential hypertension ***  3. Hyperlipidemia LDL goal <70 ***  4. Mitral valve insufficiency, unspecified etiology ***   Signed, Laymon CHRISTELLA Qua, PA-C

## 2024-08-13 ENCOUNTER — Ambulatory Visit: Admitting: Adult Health

## 2024-08-13 ENCOUNTER — Encounter: Payer: Self-pay | Admitting: Adult Health

## 2024-08-13 VITALS — BP 145/80 | HR 69 | Ht 66.0 in | Wt 197.0 lb

## 2024-08-13 DIAGNOSIS — I10 Essential (primary) hypertension: Secondary | ICD-10-CM

## 2024-08-13 DIAGNOSIS — Z09 Encounter for follow-up examination after completed treatment for conditions other than malignant neoplasm: Secondary | ICD-10-CM | POA: Insufficient documentation

## 2024-08-13 DIAGNOSIS — A599 Trichomoniasis, unspecified: Secondary | ICD-10-CM

## 2024-08-13 DIAGNOSIS — N898 Other specified noninflammatory disorders of vagina: Secondary | ICD-10-CM

## 2024-08-13 DIAGNOSIS — R102 Pelvic and perineal pain unspecified side: Secondary | ICD-10-CM | POA: Diagnosis not present

## 2024-08-13 DIAGNOSIS — Z5189 Encounter for other specified aftercare: Secondary | ICD-10-CM | POA: Diagnosis not present

## 2024-08-13 LAB — POCT WET PREP (WET MOUNT): Trichomonas Wet Prep HPF POC: ABSENT

## 2024-08-13 NOTE — Progress Notes (Signed)
  Subjective:     Patient ID: Alexis Landry, female   DOB: 1971-08-29, 53 y.o.   MRN: [id number]  HPI Alexis Landry is a 53 year old black female, separated, PM in for proof of cure for trich, took meds and has not had sex since, and he did take his meds. She says discharge, odor and pain has resolved.  Last pap was negative 04/11/22 PCP is I Polanco NP  Review of Systems For POC for +trich Vaginal discharge and odor has resolved Pelvic pain has resolved   Reviewed past medical,surgical, social and family history. Reviewed medications and allergies.  Objective:   Physical Exam BP (!) 145/80 (BP Location: Right Arm, Patient Position: Sitting, Cuff Size: Normal)   Pulse 69   Ht 5' 6 (1.676 m)   Wt 197 lb (89.4 kg)   LMP 09/19/2016 (Approximate)   BMI 31.80 kg/m     Skin warm and dry.Pelvic: external genitalia is normal in appearance no lesions, vagina: scant white discharge, no odor,urethra has no lesions or masses noted, cervix:smooth,uterus: normal size, shape and contour, non tender, no masses felt, adnexa: no masses or tenderness noted. Bladder is non tender and no masses felt. Wet prep: + few WBCs, no trich seen. Examination chaperoned by Tish RN  [facility] - 08/13/24 0943       Pregnancy Intention Screening   Does the patient want to become pregnant in the next year? N/A    Does the patient's partner want to become pregnant in the next year? N/A    Would the patient like to discuss contraceptive options today? N/A      Contraception Wrap Up   Current Method Female Sterilization;Post-Menopause    End Method Female Sterilization;Post-Menopause    Contraception Counseling Provided No          Assessment:     1. Trichomonas infection Resolved after taking flagyl  and no sex   2. Pelvic pain Resolved  3. Vaginal discharge Resolved   4. Vaginal odor Resolved   5. Follow-up exam after treatment (Primary) No trich on wet prep  - POCT Wet Prep [employer] [place])  6. Primary  hypertension Took meds this morning, continue meds and follow up with PCP    Plan:     Return in about 9 months for pap and physical

## 2024-08-30 ENCOUNTER — Other Ambulatory Visit: Payer: Self-pay | Admitting: Nurse Practitioner

## 2024-09-02 MED ORDER — OZEMPIC (0.25 OR 0.5 MG/DOSE) 2 MG/3ML ~~LOC~~ SOPN: 0.5000 mg | PEN_INJECTOR | SUBCUTANEOUS | 3 refills | Status: DC

## 2024-09-06 LAB — OPHTHALMOLOGY REPORT-SCANNED

## 2024-09-09 NOTE — Progress Notes (Unsigned)
 Cardiology Office Note:  .   Date:  09/10/2024  ID:  Alexis Landry, DOB 30-Jan-1971, MRN [id number] PCP: Terry Wilhelmena Lloyd Hilario, FNP  [facility] Providers Cardiologist:  Vina Gull, MD { History of Present Illness: .   Alexis Landry is a 53 y.o. female  with PMHx of HTN, HLD, DM, minimal CAD who reports to [facility] office for follow up.   Pertinent cardiac medical history:  CAD  ECHO 2022: EF 60 to 70%, normal RV/LV function, moderate concentric LVH, elevated LVEDP, small pericardial effusion in posterior LV and RA, mild MV regurgitation LHC 2022: minimal CAD Other relevant studies:  Carotid US  05/2022: Minimal heterogeneous plaque with no significant stenosis  Last seen in heartcare 01/31/2024 by myself for a 45-month follow-up.  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  Noted plans to follow-up with neurology.  Reports very poor fluid intake. Increased Crestor  from 20 mg to 40 mg.  Continued on losartan /HCTZ 100-25 mg daily, amlodipine  10 mg daily, clonidine  0.1 mg 1/2 tablet nightly. Ordered labs for FLP and CMP, however it is still pending and not completed.   Today, reports ongoing unchanged dizziness associated with position change and bending over.  She did not follow-up with neurology.  Reports ongoing fatigue that she contributes to very poor sleeping habits, waking up multiple times throughout the night, and a total of 3 to 4 hours of sleep per night.  She is not sure if she snores, however patient expresses interest in sleep study. Denies chest pain, shortness of breath, palpitations, syncope, presyncope, orthopnea, PND, swelling or significant weight changes, acute bleeding, or claudication.  Reports compliance with medications except she has been out of clonidine  for 1 month.  Home BPs have been mainly 130s/60s-70s with occasional outliers.  Endorses a mix of healthy and unhealthy  diet. She goes to gym x2 per week for 30 mins with personal trainer. She is social drinker. Denies tobacco use/drug use. Denies any recent hospitalizations or visits to the emergency department.   ROS: 10 point review of system has been reviewed and considered negative except ones been listed in the HPI.   Studies Reviewed: .   Echo  09/2021 IMPRESSIONS   1. Left ventricular ejection fraction, by estimation, is 65 to 70%. The left ventricle has normal function. The left ventricle has no regional wall motion abnormalities. There is moderate concentric left ventricular hypertrophy. Left ventricular  diastolic parameters are indeterminate. Elevated left ventricular  end-diastolic pressure.   2. Right ventricular systolic function is normal. The right ventricular size is normal. Tricuspid regurgitation signal is inadequate for assessing PA pressure.   3. Left atrial size was upper normal.   4. A small pericardial effusion is present. The pericardial effusion is posterior to the left ventricle and localized near the right atrium.   5. The mitral valve is grossly normal. Mild mitral valve regurgitation.   6. The aortic valve is tricuspid. Aortic valve regurgitation is not  visualized.   7. The inferior vena cava is normal in size with greater than 50% respiratory variability, suggesting right atrial pressure of 3 mmHg.   Comparison(s): Prior images reviewed side by side. No significant change in LVEF. Elevated LVEDP. Small pericardial effusion.    05/2022  Carotid USN IMPRESSION: Color duplex indicates minimal heterogeneous plaque, with no hemodynamically significant stenosis by duplex criteria in the extracranial cerebrovascular circulation.   CATH 09/2021   Dist  RCA lesion is 30% stenosed.   The left ventricular systolic function is normal.   LV end diastolic pressure is normal.   The left ventricular ejection fraction is 55-65% by visual estimate. No significant coronary obstructive disease  with mild smooth  30% narrowing in the distal RCA proximal to the PDA takeoff.  The LAD and circumflex and the remainder of the the RCA appeared angiographically normal.   Normal LV function with EF estimated at 55 to 65% without focal segmental wall motion abnormalities.  LVEDP 12 mmHg.   RECOMMENDATION: Low-dose aspirin  therapy.  Medical therapy for mild nonobstructive CAD in this patient with hypertension and hyperlipidemia.  Aim for target LDL less than 70.  Physical Exam:   VS:  BP 136/84 (BP Location: Left Arm, Cuff Size: Large)   Pulse 72   Ht 5' 6 (1.676 m)   Wt 196 lb (88.9 kg)   LMP 09/19/2016 (Approximate)   SpO2 95%   BMI 31.64 kg/m    Wt Readings from Last 3 Encounters:  09/10/24 196 lb (88.9 kg)  08/13/24 197 lb (89.4 kg)  07/30/24 195 lb (88.5 kg)    GEN: Well nourished, well developed in no acute distress while sitting in chair.  NECK: No JVD; No carotid bruits CARDIAC: RRR, no rubs, gallops; Systolic murmur 3/6 in RUSB/LUSB RESPIRATORY:  Clear to auscultation without rales, wheezing or rhonchi  ABDOMEN: Soft, non-tender, non-distended EXTREMITIES:  No edema; No deformity   ASSESSMENT AND PLAN: .   Dizziness  Still present and unchanged since last OV; associated with position changes after getting out of bed in the morning. Not associated with exertion. She did not follow-up with neurology. Will send referral to neurology for dizziness  Reports low daily fluid intake.  Suspect could be secondary to dehydration. Discussed slow position changes and encouraged adequate H20 intake.  Less concern for cardiac cause with reassuring cardiac work up.    Fatigue  TSH, CBC WNL  Not on any BB. Previously noted could be 2/2 to clonidine , however, less likely with small dosage. Also patient has note been taken clonidine  for last month with no changes in symptoms, therefore less likely due to clonidine .  Reports ongoing fatigue that she contributes to very poor sleeping  habits, waking up multiple times throughout the night, and a total of 3 to 4 hours of sleep per night.  She is not sure if she snores, however patient expresses interest in sleep study. Referred to Pulmonary for sleep study as requested by patient. STOP BANG 3.    HTN  She has been out of clonidine  for 1 month.  Home BPs have been mainly 130s/60s-70s with occasional outliers.  Patient is requesting refill on clonidine  since it also helps her sleep.   BP in office: 136/84 Discussed If BP stays elevated above 150/90 to contact office.  12/2023: Cr 1.21, K 3.9; 05/2024: Cr 1.44, K 4.8.  Continue Losartan -hydrochlorothiazide  100-25 mg, amlodipine  10 mg daily,   Resume Clonidine  0.1 mg 1/2 tablet nightly  HLD 12/2023: LDL 77 (vs 04/2023: 115), goal <70 Last OV 01/2024, Increased Crestor  20 to 40 mg. Previously ordered FLP for 06/2024 but still pending and not completed yet. Encouraged to complete FLP.  Encourage lifestyles modifications.    DM  Per patient, Endocrinology stopped Farxiga  for unknown reason.  Continue Insulin .  Managed by PCP Lab Results  Component Value Date   HGBA1C 8.9 (A) 07/01/2024   HGBA1C 7.5 (A) 02/29/2024   HGBA1C 8.8 (A) 10/16/2023  Mild MVR  ECHO 09/2021: mild MVR  Systolic murmur on exam today.  Order ECHO.     Dispo: Follow up in 6 month with Dr. Okey.  Signed, Lorette CINDERELLA Kapur, PA-C

## 2024-09-10 ENCOUNTER — Encounter: Payer: Self-pay | Admitting: Physician Assistant

## 2024-09-10 ENCOUNTER — Ambulatory Visit: Attending: Physician Assistant | Admitting: Physician Assistant

## 2024-09-10 VITALS — BP 136/84 | HR 72 | Ht 66.0 in | Wt 196.0 lb

## 2024-09-10 DIAGNOSIS — R011 Cardiac murmur, unspecified: Secondary | ICD-10-CM

## 2024-09-10 DIAGNOSIS — R42 Dizziness and giddiness: Secondary | ICD-10-CM

## 2024-09-10 DIAGNOSIS — R5383 Other fatigue: Secondary | ICD-10-CM

## 2024-09-10 DIAGNOSIS — E785 Hyperlipidemia, unspecified: Secondary | ICD-10-CM

## 2024-09-10 DIAGNOSIS — G4710 Hypersomnia, unspecified: Secondary | ICD-10-CM

## 2024-09-10 DIAGNOSIS — Z794 Long term (current) use of insulin: Secondary | ICD-10-CM

## 2024-09-10 DIAGNOSIS — I340 Nonrheumatic mitral (valve) insufficiency: Secondary | ICD-10-CM

## 2024-09-10 DIAGNOSIS — I10 Essential (primary) hypertension: Secondary | ICD-10-CM

## 2024-09-10 MED ORDER — CLONIDINE HCL ER 0.1 MG PO TB12: ORAL_TABLET | ORAL | 1 refills | Status: AC

## 2024-09-10 NOTE — Patient Instructions (Addendum)
 Medication Instructions:   Your physician recommends that you continue on your current medications as directed. Please refer to the Current Medication list given to you today.   Labwork: FASTING Lipids  Testing/Procedures: Your physician has requested that you have an echocardiogram. Echocardiography is a painless test that uses sound waves to create images of your heart. It provides your doctor with information about the size and shape of your heart and how well your heart's chambers and valves are working. This procedure takes approximately one hour. There are no restrictions for this procedure. Please do NOT wear cologne, perfume, aftershave, or lotions (deodorant is allowed). Please arrive 15 minutes prior to your appointment time.  Please note: We ask at that you not bring children with you during ultrasound (echo/ vascular) testing. Due to room size and safety concerns, children are not allowed in the ultrasound rooms during exams. Our front office staff cannot provide observation of children in our lobby area while testing is being conducted. An adult accompanying a patient to their appointment will only be allowed in the ultrasound room at the discretion of the ultrasound technician under special circumstances. We apologize for any inconvenience.   Follow-Up: 6 months Dr.Ross  Any Other Special Instructions Will Be Listed Below (If Applicable).    Referral placed to Neurology. They will call you to schedule an appointment. Referral placed to Pulmonary. They will call you to schedule an appointment  If you need a refill on your cardiac medications before your next appointment, please call your pharmacy.

## 2024-09-13 ENCOUNTER — Other Ambulatory Visit: Payer: Self-pay

## 2024-09-13 DIAGNOSIS — R42 Dizziness and giddiness: Secondary | ICD-10-CM

## 2024-09-21 LAB — CBC WITH DIFFERENTIAL/PLATELET
Basophils Absolute: 0 x10E3/uL (ref 0.0–0.2)
Basos: 0 %
EOS (ABSOLUTE): 0.1 x10E3/uL (ref 0.0–0.4)
Eos: 2 %
Hematocrit: 36.7 % (ref 34.0–46.6)
Hemoglobin: 11.7 g/dL (ref 11.1–15.9)
Immature Grans (Abs): 0 x10E3/uL (ref 0.0–0.1)
Immature Granulocytes: 0 %
Lymphocytes Absolute: 2.6 x10E3/uL (ref 0.7–3.1)
Lymphs: 43 %
MCH: 28.7 pg (ref 26.6–33.0)
MCHC: 31.9 g/dL (ref 31.5–35.7)
MCV: 90 fL (ref 79–97)
Monocytes Absolute: 0.4 x10E3/uL (ref 0.1–0.9)
Monocytes: 7 %
Neutrophils Absolute: 2.8 x10E3/uL (ref 1.4–7.0)
Neutrophils: 48 %
Platelets: 275 x10E3/uL (ref 150–450)
RBC: 4.08 x10E6/uL (ref 3.77–5.28)
RDW: 13.1 % (ref 11.7–15.4)
WBC: 5.9 x10E3/uL (ref 3.4–10.8)

## 2024-09-21 LAB — LIPID PANEL
Chol/HDL Ratio: 2 ratio (ref 0.0–4.4)
Cholesterol, Total: 128 mg/dL (ref 100–199)
HDL: 64 mg/dL
LDL Chol Calc (NIH): 46 mg/dL (ref 0–99)
Triglycerides: 99 mg/dL (ref 0–149)
VLDL Cholesterol Cal: 18 mg/dL (ref 5–40)

## 2024-10-11 ENCOUNTER — Other Ambulatory Visit: Payer: Self-pay | Admitting: Family Medicine

## 2024-10-11 DIAGNOSIS — G2581 Restless legs syndrome: Secondary | ICD-10-CM

## 2024-10-15 ENCOUNTER — Ambulatory Visit (HOSPITAL_COMMUNITY)
Admission: RE | Admit: 2024-10-15 | Discharge: 2024-10-15 | Disposition: A | Discharge status: Home / Self Care | Source: Ambulatory Visit | Attending: Physician Assistant | Admitting: Physician Assistant

## 2024-10-15 DIAGNOSIS — I340 Nonrheumatic mitral (valve) insufficiency: Secondary | ICD-10-CM | POA: Insufficient documentation

## 2024-10-15 DIAGNOSIS — R011 Cardiac murmur, unspecified: Secondary | ICD-10-CM | POA: Diagnosis not present

## 2024-10-15 LAB — ECHOCARDIOGRAM COMPLETE
Area-P 1/2: 4.21 cm2
S' Lateral: 2.1 cm

## 2024-10-15 NOTE — Progress Notes (Signed)
*  PRELIMINARY RESULTS* Echocardiogram 2D Echocardiogram has been performed.  Alexis Landry 10/15/2024, 10:26 AM

## 2024-10-16 ENCOUNTER — Ambulatory Visit: Payer: Self-pay | Admitting: Physician Assistant

## 2024-10-21 ENCOUNTER — Other Ambulatory Visit: Payer: Self-pay | Admitting: Family Medicine

## 2024-10-21 DIAGNOSIS — F5101 Primary insomnia: Secondary | ICD-10-CM

## 2024-11-04 ENCOUNTER — Ambulatory Visit: Admitting: Nurse Practitioner

## 2024-11-08 ENCOUNTER — Encounter: Payer: Self-pay | Admitting: Nurse Practitioner

## 2024-11-08 ENCOUNTER — Ambulatory Visit: Admitting: Nurse Practitioner

## 2024-11-08 ENCOUNTER — Ambulatory Visit (INDEPENDENT_AMBULATORY_CARE_PROVIDER_SITE_OTHER): Payer: Self-pay | Admitting: Neurology

## 2024-11-08 ENCOUNTER — Other Ambulatory Visit

## 2024-11-08 ENCOUNTER — Encounter: Payer: Self-pay | Admitting: Neurology

## 2024-11-08 VITALS — BP 108/71 | HR 74 | Ht 66.0 in | Wt 194.8 lb

## 2024-11-08 VITALS — BP 104/60 | HR 67 | Ht 66.0 in | Wt 196.0 lb

## 2024-11-08 DIAGNOSIS — I10 Essential (primary) hypertension: Secondary | ICD-10-CM

## 2024-11-08 DIAGNOSIS — G629 Polyneuropathy, unspecified: Secondary | ICD-10-CM

## 2024-11-08 DIAGNOSIS — E782 Mixed hyperlipidemia: Secondary | ICD-10-CM

## 2024-11-08 DIAGNOSIS — Z794 Long term (current) use of insulin: Secondary | ICD-10-CM

## 2024-11-08 DIAGNOSIS — R42 Dizziness and giddiness: Secondary | ICD-10-CM

## 2024-11-08 DIAGNOSIS — Z7984 Long term (current) use of oral hypoglycemic drugs: Secondary | ICD-10-CM

## 2024-11-08 DIAGNOSIS — N1832 Chronic kidney disease, stage 3b: Secondary | ICD-10-CM

## 2024-11-08 DIAGNOSIS — E559 Vitamin D deficiency, unspecified: Secondary | ICD-10-CM

## 2024-11-08 DIAGNOSIS — Z7985 Long-term (current) use of injectable non-insulin antidiabetic drugs: Secondary | ICD-10-CM

## 2024-11-08 DIAGNOSIS — E1122 Type 2 diabetes mellitus with diabetic chronic kidney disease: Secondary | ICD-10-CM

## 2024-11-08 LAB — TSH: TSH: 1.9 m[IU]/L

## 2024-11-08 LAB — VITAMIN B12: Vitamin B-12: 457 pg/mL (ref 200–1100)

## 2024-11-08 LAB — SEDIMENTATION RATE: Sed Rate: 36 mm/h — ABNORMAL HIGH (ref 0–30)

## 2024-11-08 LAB — POCT GLYCOSYLATED HEMOGLOBIN (HGB A1C): Hemoglobin A1C: 9.6 % — AB (ref 4.0–5.6)

## 2024-11-08 LAB — FOLATE: Folate: 11.3 ng/mL

## 2024-11-08 MED ORDER — SEMAGLUTIDE (1 MG/DOSE) 4 MG/3ML ~~LOC~~ SOPN: 1.0000 mg | PEN_INJECTOR | SUBCUTANEOUS | 1 refills | Status: AC

## 2024-11-08 MED ORDER — GLIPIZIDE 5 MG PO TABS: 5.0000 mg | ORAL_TABLET | Freq: Two times a day (BID) | ORAL | 3 refills | Status: AC

## 2024-11-08 MED ORDER — BASAGLAR KWIKPEN 100 UNIT/ML ~~LOC~~ SOPN: 15.0000 [IU] | PEN_INJECTOR | Freq: Every day | SUBCUTANEOUS | 3 refills | Status: AC
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
## 2024-11-08 NOTE — Patient Instructions (Signed)
 Good to see you.  Have bloodwork done for TSH, B12, B1, ESR, CRP, SPEP, IFE, folate, ANA  Referral will be sent for balance therapy and vestibular therapy  3. Continue working on improving glucose control, increase hydration  4. Discuss BP medication with Cardiology  5. Follow-up in 6 months, call for any changes.

## 2024-11-08 NOTE — Progress Notes (Signed)
 "                                                                        Endocrinology Follow Up Note       11/08/2024, 2:19 PM   Subjective:    Patient ID: Alexis Landry, female    DOB: 30-Sep-1971.  Alexis Landry is being seen in follow up after being seen in consultation for management of currently uncontrolled symptomatic diabetes requested by  Del Wilhelmena Lloyd Sola, FNP.   Past Medical History:  Diagnosis Date   Diabetic neuropathy (HCC)    DM (diabetes mellitus), type 2, uncontrolled    Epiretinal membrane, right    GERD (gastroesophageal reflux disease)    HTN (hypertension)    Hyperlipidemia    Macular edema, diabetic (HCC)    Rheumatoid arthritis (HCC)    Trichomoniasis    Vitamin D  deficiency     Past Surgical History:  Procedure Laterality Date   COLONOSCOPY N/A 01/04/2017   Procedure: COLONOSCOPY;  Surgeon: Margo LITTIE Haddock, MD;  Location: [facility];  Service: Endoscopy;  Laterality: N/A;  10:30am   KENALOG  INJECTION Right 12/03/2018   Procedure: LUCENTIS  0.3MG  INJECTION;  Surgeon: Jarold Mayo, MD;  Location: [facility] OR;  Service: Ophthalmology;  Laterality: Right;   LEFT HEART CATH AND CORONARY ANGIOGRAPHY N/A 09/14/2021   Procedure: LEFT HEART CATH AND CORONARY ANGIOGRAPHY;  Surgeon: Burnard Debby DELENA, MD;  Location: [facility];  Service: Cardiovascular;  Laterality: N/A;   MEMBRANE PEEL Right 12/03/2018   Procedure: MEMBRANE PEEL;  Surgeon: Jarold Mayo, MD;  Location: [facility] OR;  Service: Ophthalmology;  Laterality: Right;   PARS PLANA VITRECTOMY Right 12/03/2018   Procedure: PARS PLANA VITRECTOMY WITH 25 GAUGE;  Surgeon: Jarold Mayo, MD;  Location: [facility] OR;  Service: Ophthalmology;  Laterality: Right;   PHOTOCOAGULATION WITH LASER Right 12/03/2018   Procedure: PHOTOCOAGULATION WITH LASER;  Surgeon: Jarold Mayo, MD;  Location: [facility] OR;  Service: Ophthalmology;  Laterality: Right;   TUBAL LIGATION      Social History   Socioeconomic History   Marital  status: Legally Separated    Spouse name: Not on file   Number of children: Not on file   Years of education: Not on file   Highest education level: Not on file  Occupational History   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 Right    Are you currently employed ? 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 alone   Who lives with you? na   What type of home do you live in: 1 story or 2 story? 2 story    Social Drivers of Landry   Tobacco Use: Low Risk (11/08/2024)   Patient History  Smoking Tobacco Use: Never    Smokeless Tobacco Use: Never    Passive Exposure: Not on file  Financial Resource Strain: Low Risk (09/01/2022)   Overall Financial Resource Strain (CARDIA)    Difficulty of Paying Living Expenses: Not hard at all  Food Insecurity: No Food Insecurity (09/01/2022)   Hunger Vital Sign    Worried About Running Out of Food in the Last Year: Never true    Ran Out of Food in the Last Year: Never true  Transportation Needs: No Transportation Needs (09/01/2022)   PRAPARE - Administrator, Civil Service (Medical): No    Lack of Transportation (Non-Medical): No  Physical Activity: Insufficiently Active (09/01/2022)   Exercise Vital Sign    Days of Exercise per Week: 2 days    Minutes of Exercise per Session: 20 min  Stress: No Stress Concern Present (09/01/2022)   Alexis Landry - Occupational Stress Questionnaire    Feeling of Stress : Only a little  Social Connections: Moderately Isolated (09/01/2022)   Social Connection and  Isolation Panel    Frequency of Communication with Friends and Family: More than three times a week    Frequency of Social Gatherings with Friends and Family: Three times a week    Attends Religious Services: 1 to 4 times per year    Active Member of Clubs or Organizations: No    Attends Banker Meetings: Never    Marital Status: Separated  Depression (PHQ2-9): Low Risk (06/19/2024)   Depression (PHQ2-9)    PHQ-2 Score: 0  Alcohol Screen: Low Risk (09/01/2022)   Alcohol Screen    Last Alcohol Screening Score (AUDIT): 3  Housing: Low Risk (09/01/2022)   Housing    Last Housing Risk Score: 0  Utilities: Not At Risk (09/01/2022)   AHC Utilities    Threatened with loss of utilities: No  Landry Literacy: Not on file    Family History  Problem Relation Age of Onset   Diabetes Paternal Grandfather    Diabetes Paternal Grandmother    Diabetes Maternal Grandfather    Kidney disease Father    Heart disease Mother    Colon cancer Neg Hx     Outpatient Encounter Medications as of 11/08/2024  Medication Sig   ALPRAZolam  (XANAX ) 0.5 MG tablet Take 1 tablet (0.5 mg total) by mouth at bedtime as needed for anxiety.   amLODipine  (NORVASC ) 10 MG tablet Take 1 tablet (10 mg total) by mouth daily.   Blood Glucose Monitoring Suppl (ONE TOUCH ULTRA 2) w/Device KIT USE IN THE MORNING, AT NOON, AND AT BEDTIME   cloNIDine  HCl (KAPVAY ) 0.1 MG TB12 ER tablet Take 1/2 Tablet Daily at night   dorzolamide (TRUSOPT) 2 % ophthalmic solution 1 drop 2 (two) times daily.   gabapentin  (NEURONTIN ) 300 MG capsule TAKE 1 CAPSULE BY MOUTH THREE TIMES A DAY AS NEEDED   glucose blood (ONETOUCH ULTRA) test strip Test blood sugar three times per day dx e11.65   Insulin  Pen Needle 30G X 5 MM MISC Used as Directed   Lancets (ONETOUCH ULTRASOFT) lancets Use as instructed   losartan -hydrochlorothiazide  (HYZAAR) 100-25 MG tablet TAKE 1 TABLET BY MOUTH EVERY DAY   ofloxacin (OCUFLOX) 0.3 % ophthalmic solution  Place 1 drop into both eyes every 4 (four) hours.   rosuvastatin  (CRESTOR ) 40 MG tablet Take 1 tablet (40 mg total) by mouth daily.   Semaglutide , 1 MG/DOSE, 4 MG/3ML SOPN Inject 1 mg as directed once a week.  timolol (TIMOPTIC) 0.5 % ophthalmic solution 1 drop 2 (two) times daily.   [DISCONTINUED] glipiZIDE  (GLUCOTROL ) 5 MG tablet Take 1 tablet (5 mg total) by mouth 2 (two) times daily.   [DISCONTINUED] Insulin  Glargine (BASAGLAR  KWIKPEN) 100 UNIT/ML INJECT 12 UNITS INTO THE SKIN AT BEDTIME. (ACTUAL DAYS SUPPLY IS 125 DAYS). (Patient taking differently: 12 Units at bedtime.)   [DISCONTINUED] Semaglutide ,0.25 or 0.5MG /DOS, (OZEMPIC , 0.25 OR 0.5 MG/DOSE,) 2 MG/3ML SOPN Inject 0.5 mg into the skin once a week.   glipiZIDE  (GLUCOTROL ) 5 MG tablet Take 1 tablet (5 mg total) by mouth 2 (two) times daily before a meal.   Insulin  Glargine (BASAGLAR  KWIKPEN) 100 UNIT/ML Inject 15 Units into the skin at bedtime.   No facility-administered encounter medications on file as of 11/08/2024.    ALLERGIES: Allergies  Allergen Reactions   Lisinopril Anaphylaxis    Throat swelling    Sulfa Antibiotics Itching    VACCINATION STATUS: Immunization History  Administered Date(s) Administered   Hepatitis B, ADULT 06/04/2019   Influenza, Mdck, Trivalent,PF 6+ MOS(egg free) 07/17/2023   Influenza,inj,Quad PF,6+ Mos 06/04/2019, 09/14/2021   Pneumococcal Polysaccharide-23 06/04/2019   Tdap 06/04/2019    Diabetes She presents for her follow-up diabetic visit. She has type 2 diabetes mellitus. Onset time: diagnosed at age 54. Her disease course has been worsening. There are no hypoglycemic associated symptoms. Associated symptoms include blurred vision (hx retinopathy with injections), fatigue and polyuria. There are no hypoglycemic complications. Symptoms are stable. Diabetic complications include nephropathy and retinopathy. Risk factors for coronary artery disease include diabetes mellitus, family history,  hypertension and dyslipidemia. Current diabetic treatment includes insulin  injections and oral agent (dual therapy). Her weight is stable. She is following a generally unhealthy diet. When asked about meal planning, she reported none. She has not had a previous visit with a dietitian. She rarely participates in exercise. Her home blood glucose trend is increasing steadily. Her overall blood glucose range is >200 mg/dl. (She presents today with her meter showing inconsistent glucose monitoring and gross hyperglycemia overall.  Her POCT A1c today is 9.6%, increasing from last visit of 8.9%, Analysis of her meter shows 7-day average of 262 (2 readings), 14-day average of 242 (5 readings), 30-day average of 229 (6 readings).  She notes she is having intense sugar cravings.) An ACE inhibitor/angiotensin II receptor blocker is being taken. She does not see a podiatrist.Eye exam is current.     Review of systems  Constitutional: + Minimally fluctuating body weight, current Body mass index is 31.64 kg/m., no fatigue, no subjective hyperthermia, no subjective hypothermia Eyes: + blurry vision, no xerophthalmia ENT: no sore throat, no nodules palpated in throat, no dysphagia/odynophagia, no hoarseness Cardiovascular: no chest pain, no shortness of breath, no palpitations, no leg swelling Respiratory: no cough, no shortness of breath Gastrointestinal: no nausea/vomiting/diarrhea Musculoskeletal: no muscle/joint aches Skin: no rashes, no hyperemia Neurological: no tremors, no numbness, no tingling, no dizziness Psychiatric: no depression, no anxiety  Objective:     BP 104/60 (BP Location: Left Arm, Patient Position: Sitting, Cuff Size: Large)   Pulse 67   Ht 5' 6 (1.676 m)   Wt 196 lb (88.9 kg)   LMP 09/19/2016   BMI 31.64 kg/m   Wt Readings from Last 3 Encounters:  11/08/24 196 lb (88.9 kg)  11/08/24 194 lb 12.8 oz (88.4 kg)  09/10/24 196 lb (88.9 kg)     BP Readings from Last 3 Encounters:   11/08/24 104/60  11/08/24 108/71  09/10/24 136/84  Physical Exam- Limited  Constitutional:  Body mass index is 31.64 kg/m. , not in acute distress, normal state of mind Eyes:  EOMI, no exophthalmos Musculoskeletal: no gross deformities, strength intact in all four extremities, no gross restriction of joint movements Skin:  no rashes, no hyperemia Neurological: no tremor with outstretched hands   Diabetic Foot Exam - Simple   No data filed      CMP ( most recent) CMP     Component Value Date/Time   NA 142 12/15/2023 0928   K 3.9 12/15/2023 0928   CL 104 12/15/2023 0928   CO2 23 12/15/2023 0928   GLUCOSE 172 (H) 12/15/2023 0928   GLUCOSE 196 (H) 03/18/2023 1327   BUN 25 (H) 12/15/2023 0928   CREATININE 1.21 (H) 12/15/2023 0928   CALCIUM  9.6 12/15/2023 0928   PROT 7.2 04/19/2023 1450   PROT 6.8 12/21/2022 0907   ALBUMIN 4.2 12/21/2022 0907   AST 16 12/21/2022 0907   ALT 11 12/21/2022 0907   ALKPHOS 56 12/21/2022 0907   BILITOT 1.2 12/21/2022 0907   EGFR 53 (L) 12/15/2023 0928   GFRNONAA 48 (L) 03/18/2023 1327     Diabetic Labs (most recent): Lab Results  Component Value Date   HGBA1C 9.6 (A) 11/08/2024   HGBA1C 8.9 (A) 07/01/2024   HGBA1C 7.5 (A) 02/29/2024   MICROALBUR 150mg /L 07/01/2024     Lipid Panel ( most recent) Lipid Panel     Component Value Date/Time   CHOL 128 09/20/2024 0849   TRIG 99 09/20/2024 0849   HDL 64 09/20/2024 0849   CHOLHDL 2.0 09/20/2024 0849   CHOLHDL 3.2 09/14/2021 0450   VLDL 20 09/14/2021 0450   LDLCALC 46 09/20/2024 0849   LABVLDL 18 09/20/2024 0849      Lab Results  Component Value Date   TSH 2.410 12/15/2023   TSH 1.13 04/19/2023   TSH 2.090 12/21/2022   FREET4 1.15 12/15/2023   FREET4 1.12 12/21/2022           Assessment & Plan:   1) Type 2 diabetes mellitus with stage 3b chronic kidney disease, with long-term current use of insulin  (HCC) (Primary)  She presents today with her meter showing  inconsistent glucose monitoring and gross hyperglycemia overall.  Her POCT A1c today is 9.6%, increasing from last visit of 8.9%, Analysis of her meter shows 7-day average of 262 (2 readings), 14-day average of 242 (5 readings), 30-day average of 229 (6 readings).  She notes she is having intense sugar cravings.  - Alexis Landry has currently uncontrolled symptomatic type 2 DM since 54 years of age.   -Recent labs reviewed.  - I had a long discussion with her about the progressive nature of diabetes and the pathology behind its complications. -her diabetes is complicated by CKD stage 3a, and retinopathy and she remains at a high risk for more acute and chronic complications which include CAD, CVA, CKD, retinopathy, and neuropathy. These are all discussed in detail with her.  The following Lifestyle Medicine recommendations according to American College of Lifestyle Medicine [facility]) were discussed and offered to patient and she agrees to start the journey:  A. Whole Foods, Plant-based plate comprising of fruits and vegetables, plant-based proteins, whole-grain carbohydrates was discussed in detail with the patient.   A list for source of those nutrients were also provided to the patient.  Patient will use only water  or unsweetened tea for hydration. B.  The need to stay away from risky substances including alcohol,  smoking; obtaining 7 to 9 hours of restorative sleep, at least 150 minutes of moderate intensity exercise weekly, the importance of healthy social connections,  and stress reduction techniques were discussed. C.  A full color page of  Calorie density of various food groups per pound showing examples of each food groups was provided to the patient.  - Nutritional counseling repeated/built upon at each appointment.  - The patient admits there is a room for improvement in their diet and drink choices. -  Suggestion is made for the patient to avoid simple carbohydrates from their diet  including Cakes, Sweet Desserts / Pastries, Ice Cream, Soda (diet and regular), Sweet Tea, Candies, Chips, Cookies, Sweet Pastries, Store Bought Juices, Alcohol in Excess of 1-2 drinks a day, Artificial Sweeteners, Coffee Creamer, and Sugar-free Products. This will help patient to have stable blood glucose profile and potentially avoid unintended weight gain.   - I encouraged the patient to switch to unprocessed or minimally processed complex starch and increased protein intake (animal or plant source), fruits, and vegetables.   - Patient is advised to stick to a routine mealtimes to eat 3 meals a day and avoid unnecessary snacks (to snack only to correct hypoglycemia).  - I have approached her with the following individualized plan to manage her diabetes and patient agrees:   -She is advised to increase Basaglar  to 15 units SQ nightly, continue Glipizide  5 mg po twice daily, and increase Ozempic  to 1 mg SQ weekly.  -Due to recurrent yeast and UTI symptoms, Farxiga  was discontinued and she reports complete resolution of her GU symptoms after stopping.    -she is encouraged to START CONSISTENTLY monitoring glucose 2 times daily, before breakfast and before bed, and to call the clinic if she has readings less than 70 or above 300 for 3 tests in a row.  - she is warned not to take insulin  without proper monitoring per orders. - Adjustment parameters are given to her for hypo and hyperglycemia in writing.  - she is not a candidate for Metformin due to concurrent renal insufficiency.  - Specific targets for  A1c; LDL, HDL, and Triglycerides were discussed with the patient.  2) Blood Pressure /Hypertension:  her blood pressure is controlled to target.   she is advised to continue her current medications as prescribed by her PCP including ACE to protect kidneys from diabetes.  3) Lipids/Hyperlipidemia:    Review of her recent lipid panel from 09/20/24 showed controlled LDL at 46 .  she is advised  to continue Crestor  20 mg daily at bedtime.  Side effects and precautions discussed with her.  4)  Weight/Diet:  her Body mass index is 31.64 kg/m.  -  clearly complicating her diabetes care.   she is a candidate for weight loss. I discussed with her the fact that loss of 5 - 10% of her  current body weight will have the most impact on her diabetes management.  Exercise, and detailed carbohydrates information provided  -  detailed on discharge instructions.  5) Chronic Care/Landry Maintenance: -she is on ACEI/ARB and Statin medications and is encouraged to initiate and continue to follow up with Ophthalmology, Dentist, Podiatrist at least yearly or according to recommendations, and advised to stay away from smoking. I have recommended yearly flu vaccine and pneumonia vaccine at least every 5 years; moderate intensity exercise for up to 150 minutes weekly; and sleep for at least 7 hours a day.  - she is advised to maintain close follow  up with Del Wilhelmena Lloyd Sola, FNP for primary care needs, as well as her other providers for optimal and coordinated care.     I spent  24  minutes in the care of the patient today including review of labs from CMP, Lipids, Thyroid Function, Hematology (current and previous including abstractions from other facilities); face-to-face time discussing  her blood glucose readings/logs, discussing hypoglycemia and hyperglycemia episodes and symptoms, medications doses, her options of short and long term treatment based on the latest standards of care / guidelines;  discussion about incorporating lifestyle medicine;  and documenting the encounter. Risk reduction counseling performed per USPSTF guidelines to reduce obesity and cardiovascular risk factors.     Please refer to Patient Instructions for Blood Glucose Monitoring and Insulin /Medications Dosing Guide  in media tab for additional information. Please  also refer to  Patient Self Inventory in the Media  tab for  reviewed elements of pertinent patient history.  Alexis Landry participated in the discussions, expressed understanding, and voiced agreement with the above plans.  All questions were answered to her satisfaction. she is encouraged to contact clinic should she have any questions or concerns prior to her return visit.   Follow up plan: - Return in about 3 months (around 02/05/2025) for Diabetes F/U with A1c in office, No previsit labs, Bring meter and logs.   Benton Rio, [facility] [address] [place], [place] [address] Phone: [phone] Fax: [phone]  11/08/2024, 2:19 PM    "

## 2024-11-14 ENCOUNTER — Ambulatory Visit: Admitting: Pulmonary Disease

## 2025-02-17 ENCOUNTER — Ambulatory Visit: Admitting: Nurse Practitioner

## 2025-05-08 ENCOUNTER — Ambulatory Visit: Payer: Self-pay | Admitting: Neurology
